# Patient Record
Sex: Female | Born: 1955 | Race: White | Hispanic: No | Marital: Married | Smoking: Never smoker
Health system: Southern US, Community
[De-identification: ages and names within clinical notes are randomized; demographics above are authoritative.]

## PROBLEM LIST (undated history)

## (undated) DIAGNOSIS — K760 Fatty (change of) liver, not elsewhere classified: Secondary | ICD-10-CM

## (undated) DIAGNOSIS — K449 Diaphragmatic hernia without obstruction or gangrene: Secondary | ICD-10-CM

## (undated) DIAGNOSIS — M797 Fibromyalgia: Secondary | ICD-10-CM

## (undated) DIAGNOSIS — D649 Anemia, unspecified: Secondary | ICD-10-CM

## (undated) DIAGNOSIS — F32A Depression, unspecified: Secondary | ICD-10-CM

## (undated) DIAGNOSIS — R011 Cardiac murmur, unspecified: Secondary | ICD-10-CM

## (undated) DIAGNOSIS — M549 Dorsalgia, unspecified: Secondary | ICD-10-CM

## (undated) DIAGNOSIS — M5136 Other intervertebral disc degeneration, lumbar region: Secondary | ICD-10-CM

## (undated) DIAGNOSIS — J189 Pneumonia, unspecified organism: Secondary | ICD-10-CM

## (undated) DIAGNOSIS — H04123 Dry eye syndrome of bilateral lacrimal glands: Secondary | ICD-10-CM

## (undated) DIAGNOSIS — M2550 Pain in unspecified joint: Secondary | ICD-10-CM

## (undated) DIAGNOSIS — R0602 Shortness of breath: Secondary | ICD-10-CM

## (undated) DIAGNOSIS — K219 Gastro-esophageal reflux disease without esophagitis: Secondary | ICD-10-CM

## (undated) DIAGNOSIS — I341 Nonrheumatic mitral (valve) prolapse: Secondary | ICD-10-CM

## (undated) DIAGNOSIS — F909 Attention-deficit hyperactivity disorder, unspecified type: Secondary | ICD-10-CM

## (undated) DIAGNOSIS — I10 Essential (primary) hypertension: Secondary | ICD-10-CM

## (undated) DIAGNOSIS — M1990 Unspecified osteoarthritis, unspecified site: Secondary | ICD-10-CM

## (undated) DIAGNOSIS — D175 Benign lipomatous neoplasm of intra-abdominal organs: Secondary | ICD-10-CM

## (undated) DIAGNOSIS — M542 Cervicalgia: Secondary | ICD-10-CM

## (undated) DIAGNOSIS — F419 Anxiety disorder, unspecified: Secondary | ICD-10-CM

## (undated) DIAGNOSIS — G43909 Migraine, unspecified, not intractable, without status migrainosus: Secondary | ICD-10-CM

## (undated) DIAGNOSIS — E739 Lactose intolerance, unspecified: Secondary | ICD-10-CM

## (undated) DIAGNOSIS — K5900 Constipation, unspecified: Secondary | ICD-10-CM

## (undated) DIAGNOSIS — K8020 Calculus of gallbladder without cholecystitis without obstruction: Secondary | ICD-10-CM

## (undated) DIAGNOSIS — R079 Chest pain, unspecified: Secondary | ICD-10-CM

## (undated) DIAGNOSIS — E063 Autoimmune thyroiditis: Secondary | ICD-10-CM

## (undated) DIAGNOSIS — K5790 Diverticulosis of intestine, part unspecified, without perforation or abscess without bleeding: Secondary | ICD-10-CM

## (undated) DIAGNOSIS — K648 Other hemorrhoids: Secondary | ICD-10-CM

## (undated) DIAGNOSIS — J45909 Unspecified asthma, uncomplicated: Secondary | ICD-10-CM

## (undated) DIAGNOSIS — M5126 Other intervertebral disc displacement, lumbar region: Secondary | ICD-10-CM

## (undated) DIAGNOSIS — E876 Hypokalemia: Secondary | ICD-10-CM

## (undated) DIAGNOSIS — M51369 Other intervertebral disc degeneration, lumbar region without mention of lumbar back pain or lower extremity pain: Secondary | ICD-10-CM

## (undated) DIAGNOSIS — D126 Benign neoplasm of colon, unspecified: Secondary | ICD-10-CM

## (undated) HISTORY — DX: Chest pain, unspecified: R07.9

## (undated) HISTORY — DX: Lactose intolerance, unspecified: E73.9

## (undated) HISTORY — DX: Constipation, unspecified: K59.00

## (undated) HISTORY — DX: Other intervertebral disc degeneration, lumbar region: M51.36

## (undated) HISTORY — DX: Shortness of breath: R06.02

## (undated) HISTORY — DX: Diverticulosis of intestine, part unspecified, without perforation or abscess without bleeding: K57.90

## (undated) HISTORY — DX: Anemia, unspecified: D64.9

## (undated) HISTORY — DX: Gastro-esophageal reflux disease without esophagitis: K21.9

## (undated) HISTORY — DX: Unspecified asthma, uncomplicated: J45.909

## (undated) HISTORY — PX: COLONOSCOPY: SHX174

## (undated) HISTORY — DX: Migraine, unspecified, not intractable, without status migrainosus: G43.909

## (undated) HISTORY — DX: Fatty (change of) liver, not elsewhere classified: K76.0

## (undated) HISTORY — DX: Benign neoplasm of colon, unspecified: D12.6

## (undated) HISTORY — DX: Other intervertebral disc degeneration, lumbar region without mention of lumbar back pain or lower extremity pain: M51.369

## (undated) HISTORY — DX: Hypokalemia: E87.6

## (undated) HISTORY — DX: Essential (primary) hypertension: I10

## (undated) HISTORY — DX: Nonrheumatic mitral (valve) prolapse: I34.1

## (undated) HISTORY — DX: Anxiety disorder, unspecified: F41.9

## (undated) HISTORY — DX: Cervicalgia: M54.2

## (undated) HISTORY — DX: Depression, unspecified: F32.A

## (undated) HISTORY — DX: Unspecified osteoarthritis, unspecified site: M19.90

## (undated) HISTORY — DX: Diaphragmatic hernia without obstruction or gangrene: K44.9

## (undated) HISTORY — DX: Pain in unspecified joint: M25.50

## (undated) HISTORY — DX: Cardiac murmur, unspecified: R01.1

## (undated) HISTORY — DX: Dorsalgia, unspecified: M54.9

## (undated) HISTORY — DX: Benign lipomatous neoplasm of intra-abdominal organs: D17.5

## (undated) HISTORY — DX: Calculus of gallbladder without cholecystitis without obstruction: K80.20

## (undated) HISTORY — DX: Other intervertebral disc displacement, lumbar region: M51.26

## (undated) HISTORY — DX: Other hemorrhoids: K64.8

## (undated) HISTORY — DX: Fibromyalgia: M79.7

## (undated) HISTORY — DX: Dry eye syndrome of bilateral lacrimal glands: H04.123

## (undated) HISTORY — PX: HERNIA REPAIR: SHX51

---

## 1988-08-02 HISTORY — PX: TOE SURGERY: SHX1073

## 1996-08-02 HISTORY — PX: ADENOIDECTOMY: SUR15

## 1999-11-27 ENCOUNTER — Ambulatory Visit (HOSPITAL_COMMUNITY): Admission: RE | Admit: 1999-11-27 | Discharge: 1999-11-27 | Payer: Self-pay | Admitting: Gastroenterology

## 1999-11-27 ENCOUNTER — Encounter: Payer: Self-pay | Admitting: Gastroenterology

## 2001-04-25 ENCOUNTER — Encounter: Admission: RE | Admit: 2001-04-25 | Discharge: 2001-04-25 | Payer: Self-pay | Admitting: General Surgery

## 2001-04-25 ENCOUNTER — Encounter: Payer: Self-pay | Admitting: General Surgery

## 2001-07-02 HISTORY — PX: HERNIA REPAIR: SHX51

## 2001-07-07 ENCOUNTER — Encounter: Payer: Self-pay | Admitting: General Surgery

## 2001-07-11 ENCOUNTER — Observation Stay (HOSPITAL_COMMUNITY): Admission: RE | Admit: 2001-07-11 | Discharge: 2001-07-12 | Payer: Self-pay | Admitting: General Surgery

## 2003-08-03 HISTORY — PX: HYSTEROSCOPY W/ ENDOMETRIAL ABLATION: SUR665

## 2003-11-08 ENCOUNTER — Ambulatory Visit (HOSPITAL_COMMUNITY): Admission: RE | Admit: 2003-11-08 | Discharge: 2003-11-08 | Payer: Self-pay | Admitting: Obstetrics & Gynecology

## 2003-11-08 IMAGING — CT CT PELVIS W/ CM
1 of 3 series · 14 of 32 positions shown, 19 images · IV contrast (CONTRAST)
Comparison: none

CLINICAL DATA: Abdominal bloating; history of   multiple prior abdominal surgeries.  Nausea and decreased appetite.
TECHNIQUE: Contiguous axial CT images were obtained from the lung bases through the iliac crests following oral contrast and intravenous administration of 100 cc Omnipaque 300 IV contrast.
 CT ABDOMEN WITH CONTRAST
 Comparing to the report from prior CT scan from [REDACTED] dated [DATE].  
 There are numerous hypodensities in the liver, most of which appear to represent cysts.  Some of these are too small to characterize.  These are mentioned in passing on the prior report but I do not have the prior exam available for direct comparison and thus, I cannot compare these on an individual basis.  
 The spleen and adrenal glands appear normal.  The pancreas appears unremarkable.  There are clips near the gastroesophageal junction and also an anterior hernia mesh.  
 One of the lesions in the liver, on image #24 of series 134, is particularly indistinct and is not visible on the delayed images.  This could represent a hemangioma but is not well defined like the other lesions in the liver.  
 No retroperitoneal adenopathy.  
 IMPRESSION
 Numerous liver lesions, most of which are cysts, but some of which are non specific.  One of these, on image #24, is very indistinct.   While statistically this is likely to be a hemangioma or other benign lesion, I cannot exclude a metastatic lesion.  This may warrant further evaluation, such as with dynamic MR or ultrasound.  
 CT PELVIS WITH CONTRAST
 Contiguous axial CT images were obtained from the iliac crests through the proximal femurs.
 Urinary bladder appears unremarkable.  Endometrium is well seen as are the ovaries.  No free pelvic fluid.  Visualized bowel appears unremarkable.  
 No significant abnormal pelvic findings.

[Series 134: — · axial · 0.73mm/px · z∈[+1214,+1614]mm · 14 of 92 slices shown, 19 images]
[im 6/92  soft-tissue]
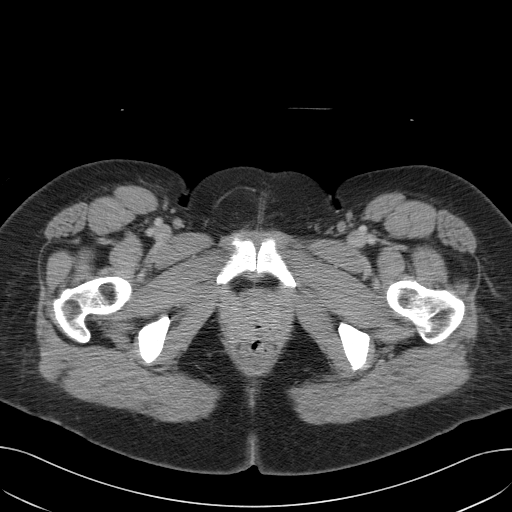
[im 6/92  bone]
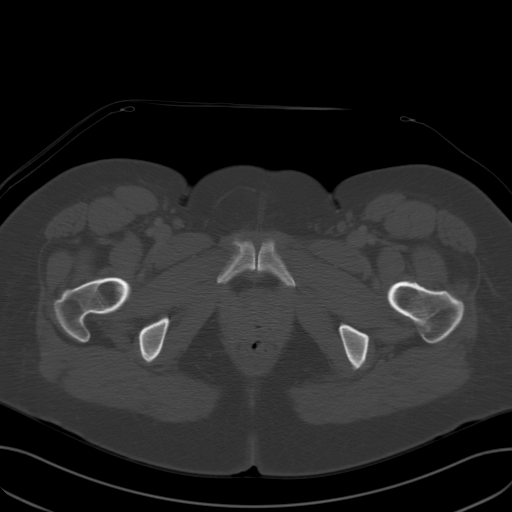
[im 11/92  soft-tissue]
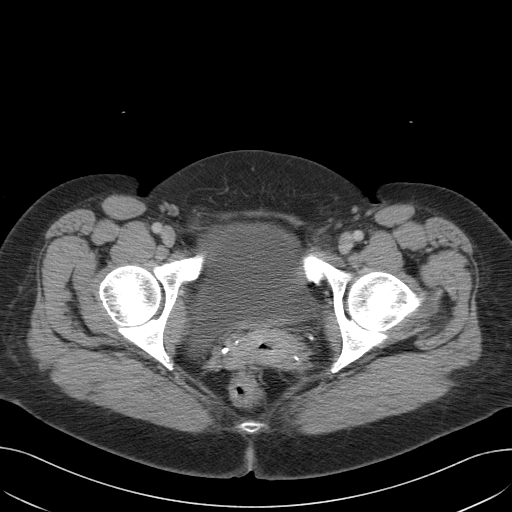
[im 22/92  soft-tissue]
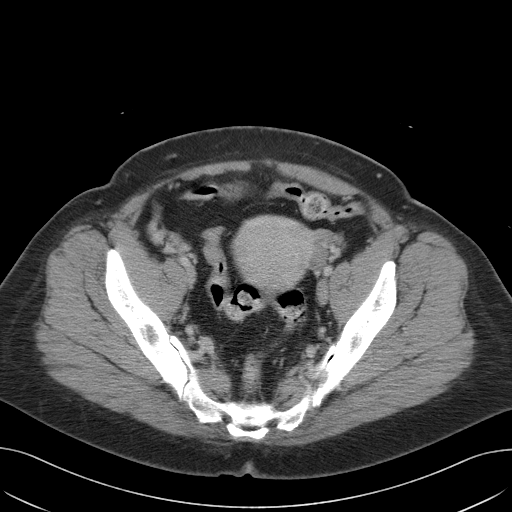
[im 27/92  soft-tissue]
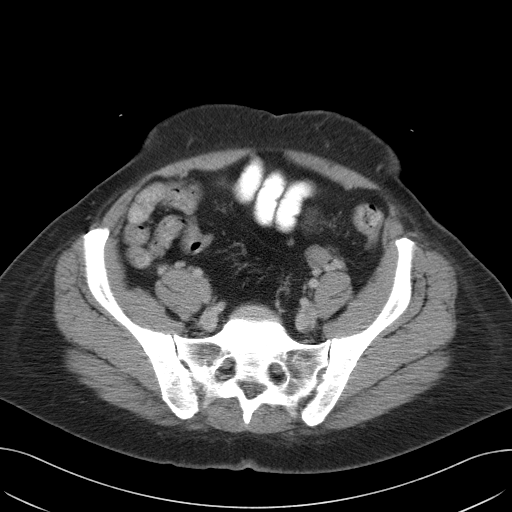
[im 33/92  soft-tissue]
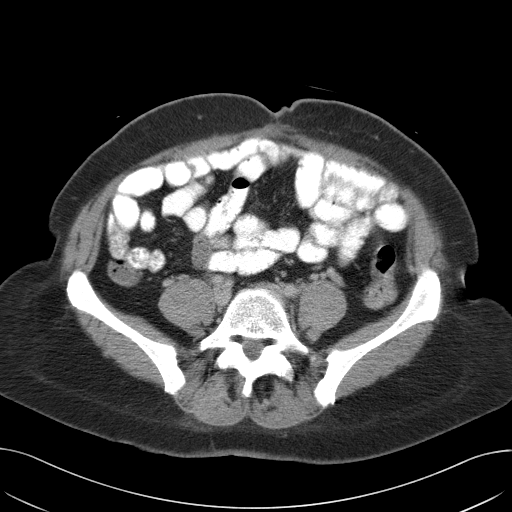
[im 38/92  soft-tissue]
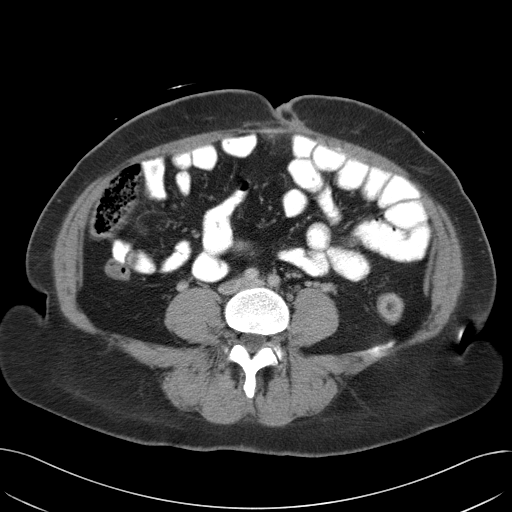
[im 49/92  soft-tissue]
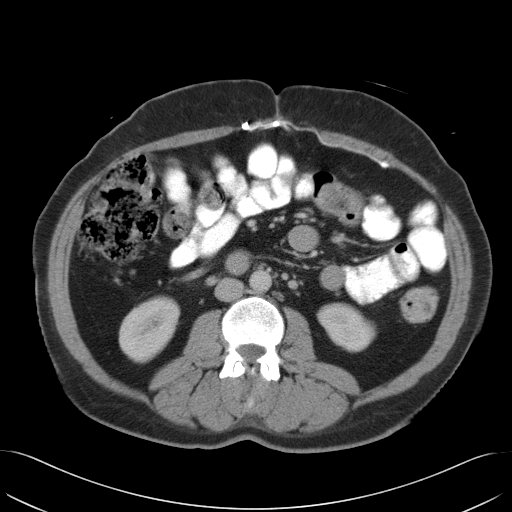
[im 54/92  soft-tissue]
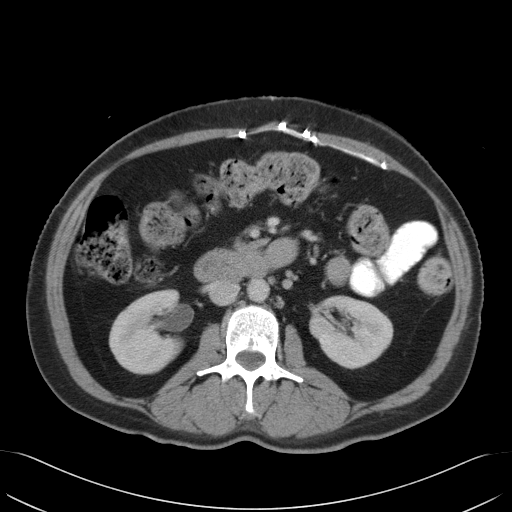
[im 59/92  soft-tissue]
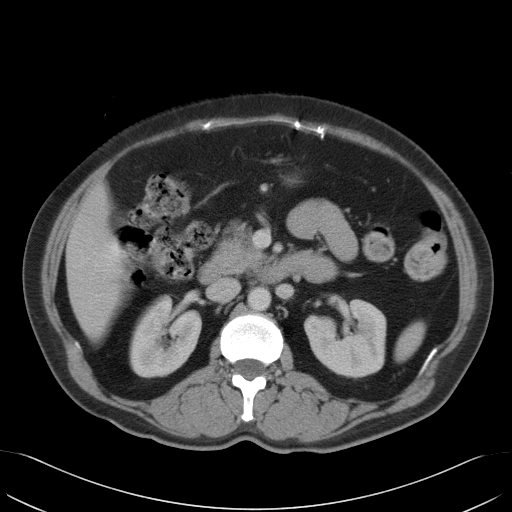
[im 59/92  bone]
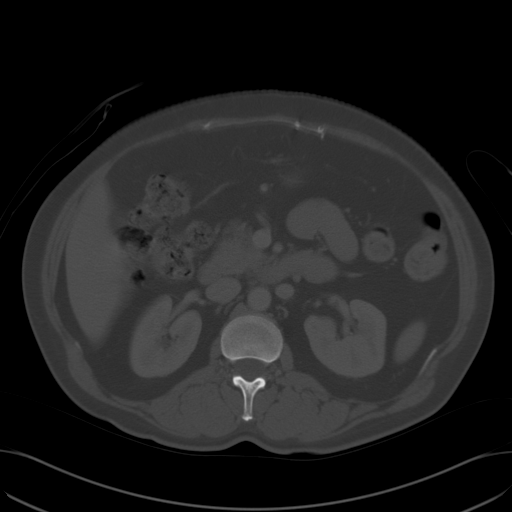
[im 65/92  soft-tissue]
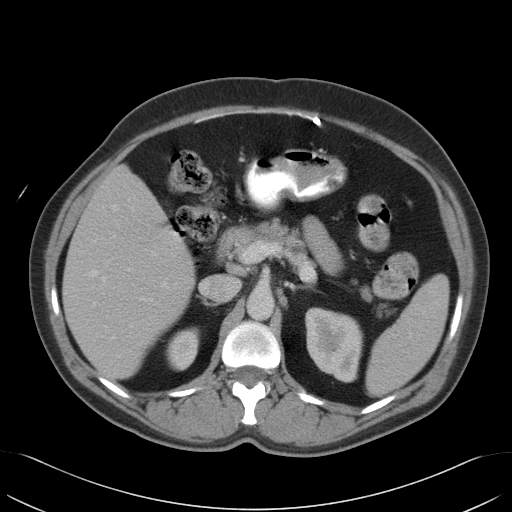
[im 70/92  soft-tissue]
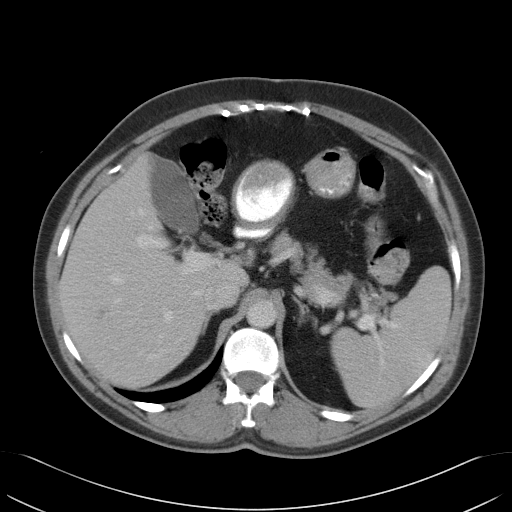
[im 70/92  lung]
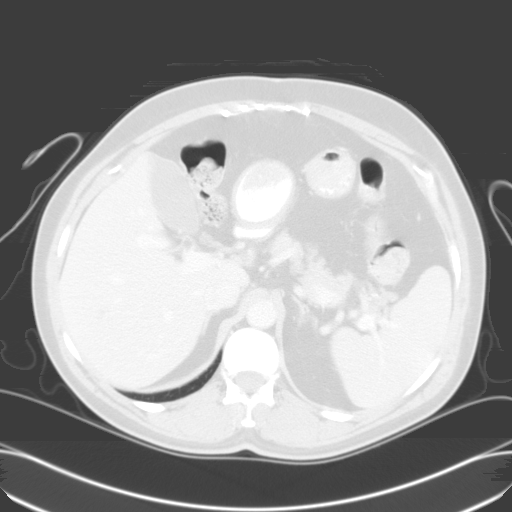
[im 75/92  lung]
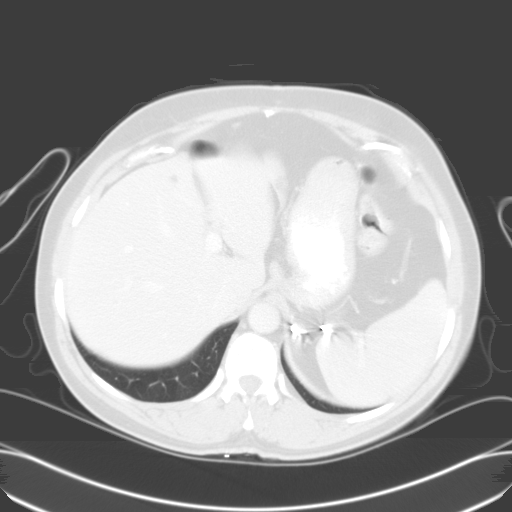
[im 81/92  soft-tissue]
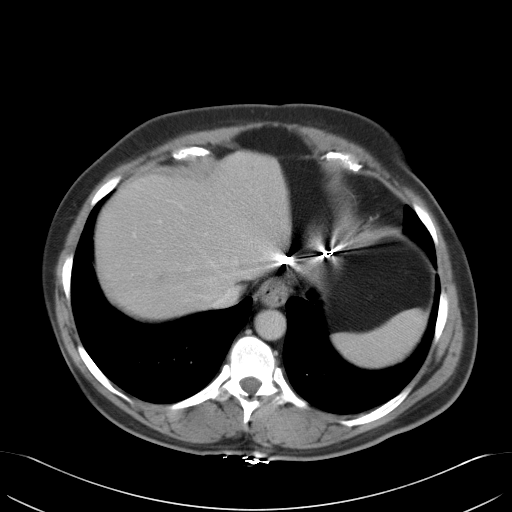
[im 81/92  lung]
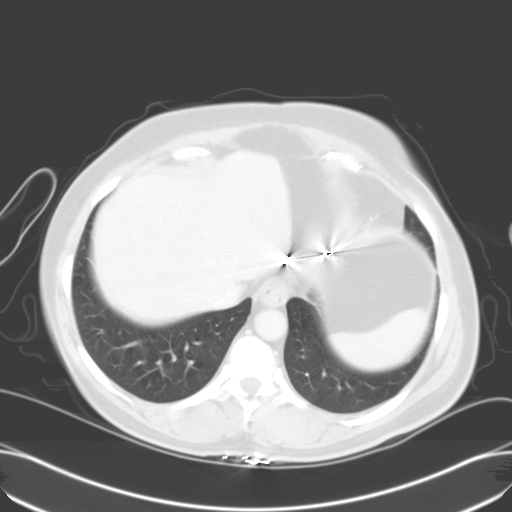
[im 86/92  soft-tissue]
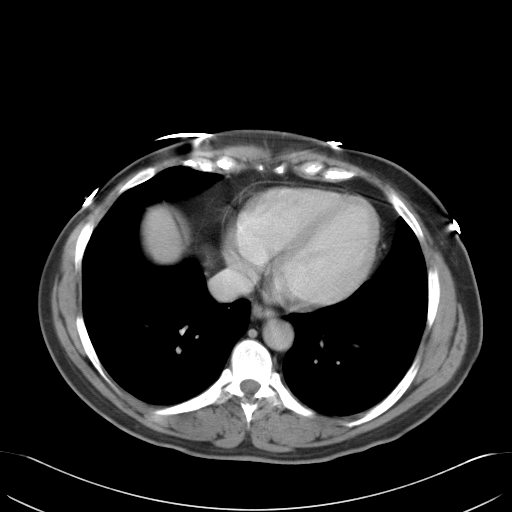
[im 86/92  lung]
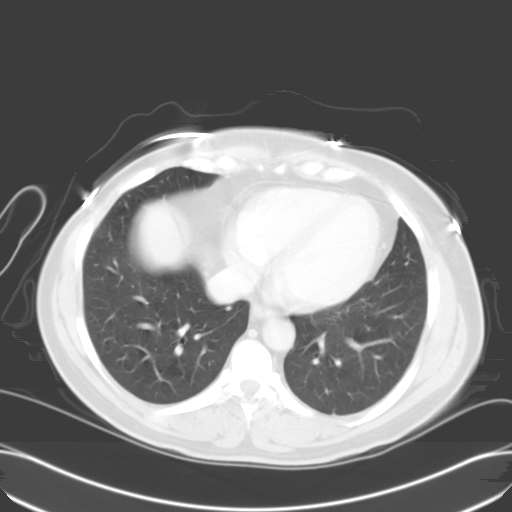

[14 of 32 positions shown; findings below may reference images not displayed]

## 2003-11-19 ENCOUNTER — Ambulatory Visit (HOSPITAL_COMMUNITY): Admission: RE | Admit: 2003-11-19 | Discharge: 2003-11-19 | Payer: Self-pay | Admitting: Obstetrics & Gynecology

## 2003-11-19 IMAGING — US US ABDOMEN LIMITED
1 series · 13 of 25 positions shown · non-contrast
Comparison: none

CLINICAL DATA: Follow-up liver cyst, also questionable lesion in the right lobe on recent CT.
 LIMITED ABDOMINAL ULTRASOUND
 Limited scans of the abdomen were performed.  This is correlated with the CT of the abdomen from [HOSPITAL] dated [DATE].  
 The liver lesions described on recent CT do appear to be primarily cystic although several are septated or have minimal debris within them.  One lesion in the lateral aspect of the right lobe is hyperechogenic and it was not well defined on CT, most consistent with hemangioma.  No other hepatic lesion is seen.  No ductal dilatation is noted.  There is a single gallbladder polyp present.  Also, it is noted that the gallbladder wall is slightly prominent measuring approximately 4 mm in thickness.  This could be due to some contraction, but edema cannot be excluded.  Clinical correlation and correlation with LFTs is recommended.  The common bile duct is normal in caliber. 
 IMPRESSION
 1.  The majority of the lesions noted on CT throughout the liver represent cysts, some of which are septated with some internal debris. 
 2.  Poorly defined lesion in the right lobe of liver by CT is compatible with hemangioma by ultrasound. 
 3.  Gallbladder polyp.  Somewhat thickened gallbladder wall.  Correlate clinically.

[Series 1: unknown · 0.33mm/px · 13 of 47 slices shown]
[im 1/47]
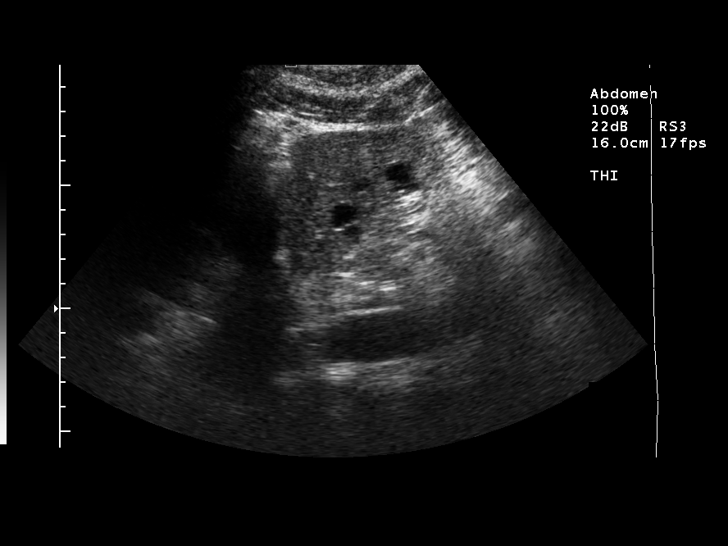
[im 4/47]
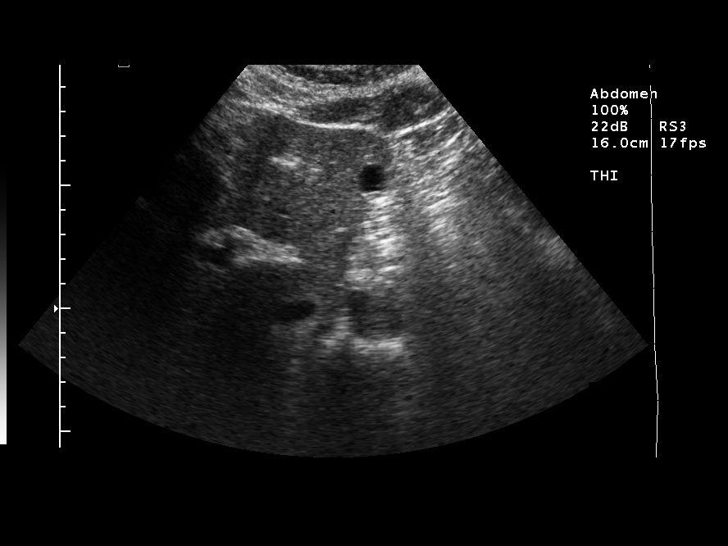
[im 8/47]
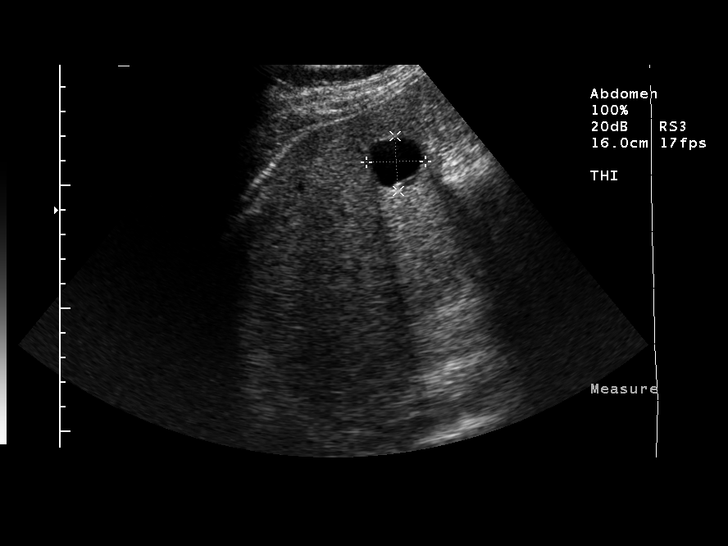
[im 12/47]
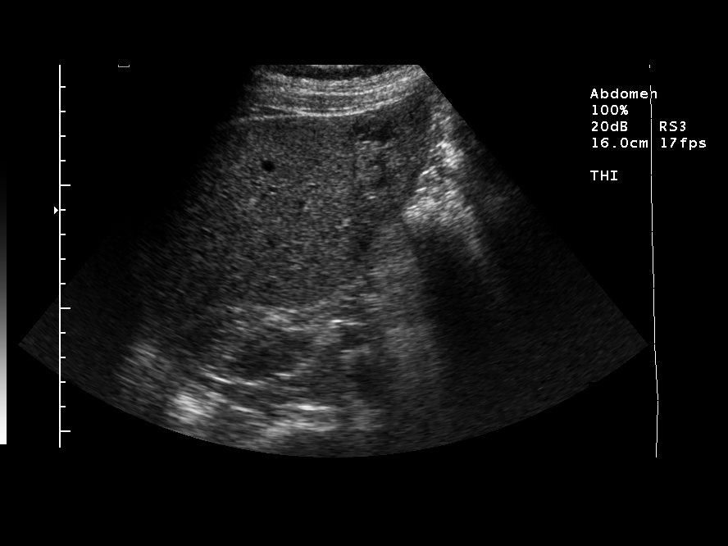
[im 16/47]
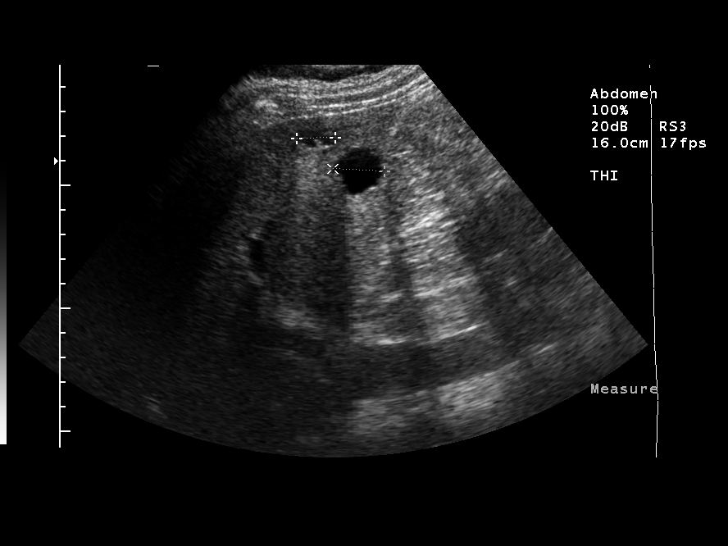
[im 20/47]
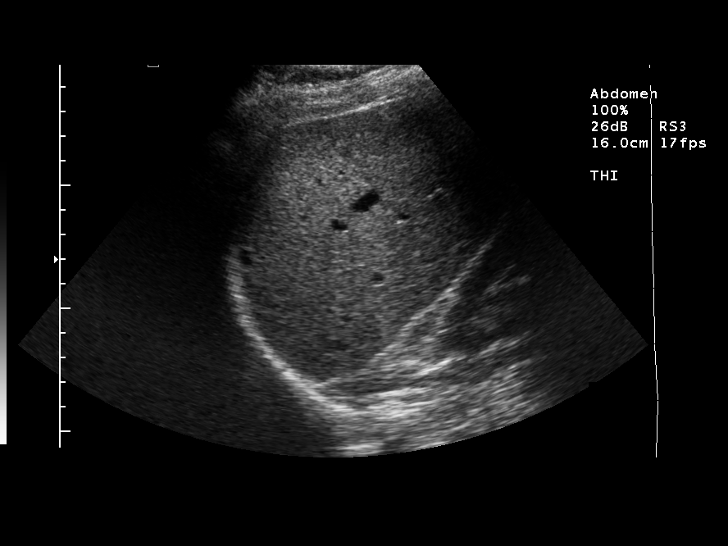
[im 24/47]
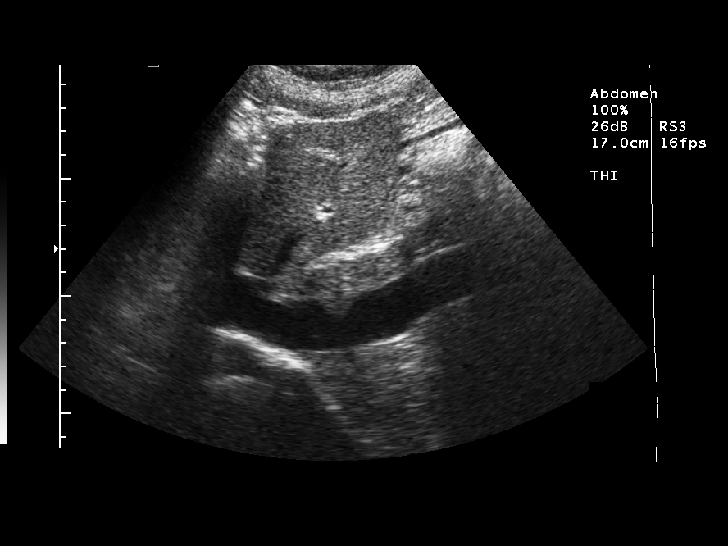
[im 27/47]
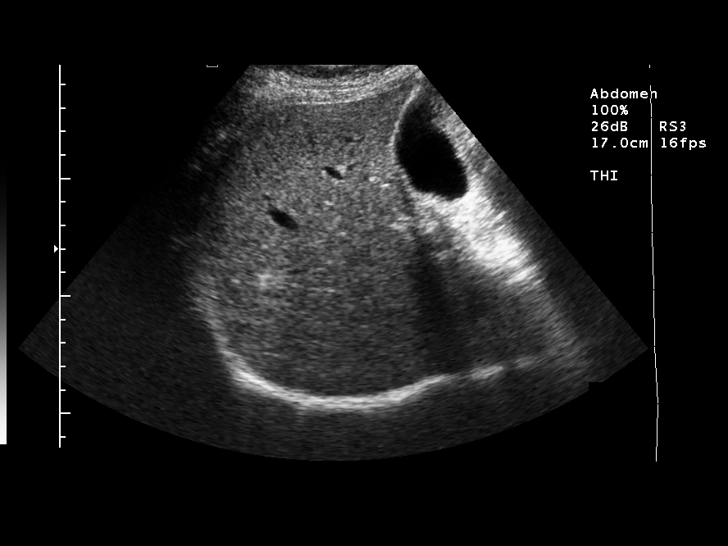
[im 31/47]
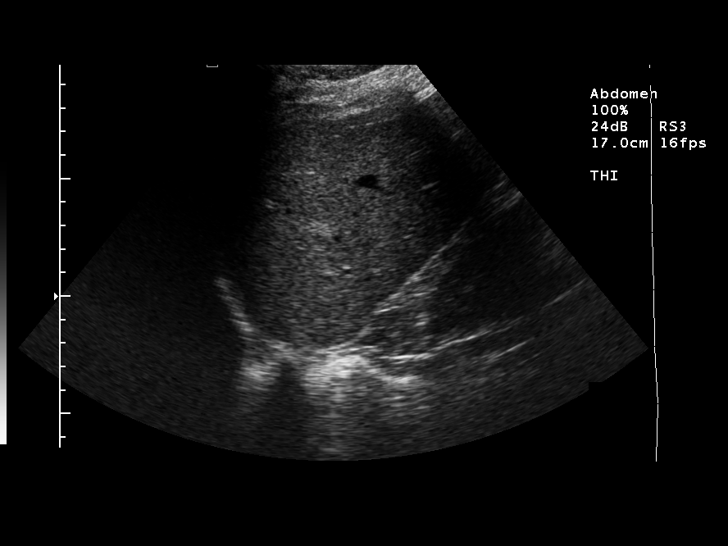
[im 35/47]
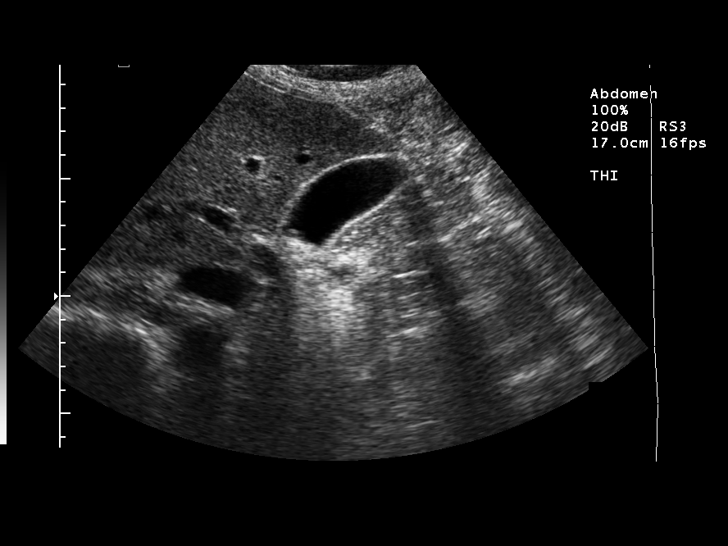
[im 39/47]
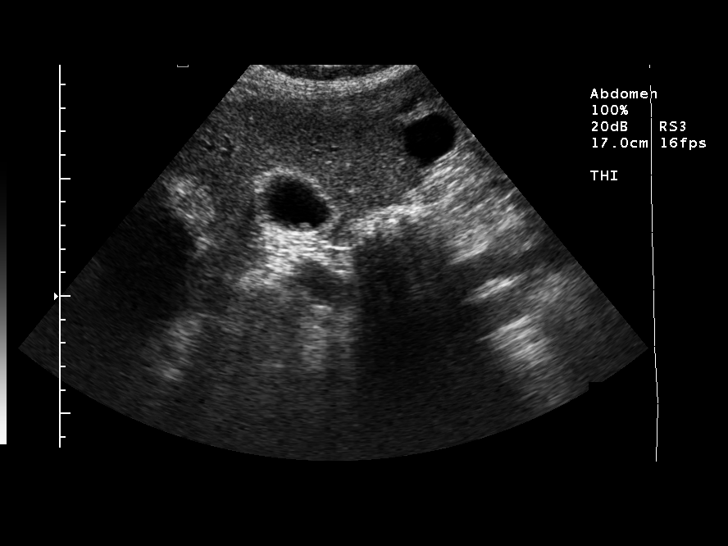
[im 43/47]
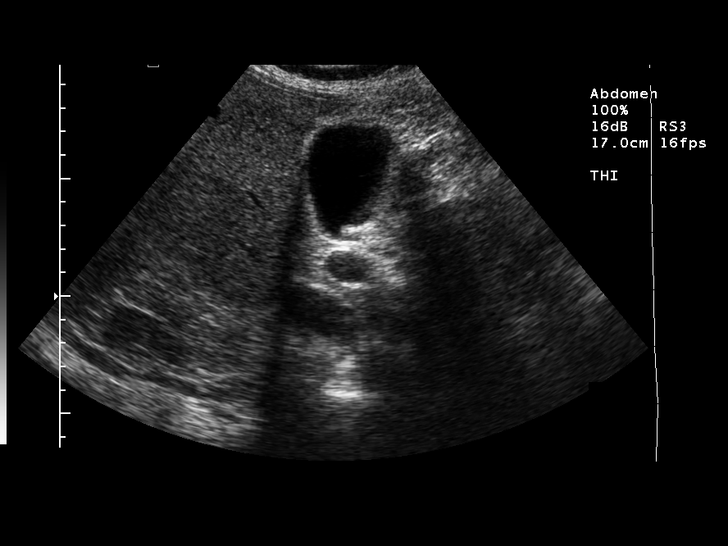
[im 47/47]
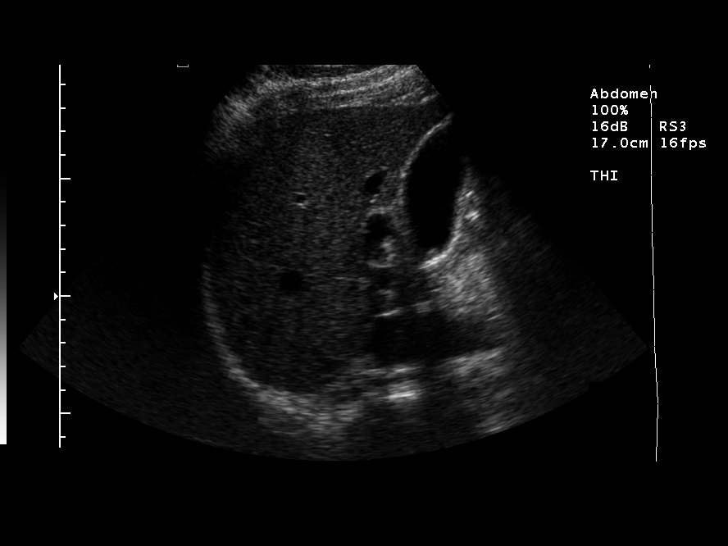

[13 of 25 positions shown; findings below may reference images not displayed]

## 2003-11-21 ENCOUNTER — Ambulatory Visit (HOSPITAL_COMMUNITY): Admission: RE | Admit: 2003-11-21 | Discharge: 2003-11-21 | Payer: Self-pay | Admitting: Obstetrics & Gynecology

## 2003-12-17 ENCOUNTER — Ambulatory Visit (HOSPITAL_COMMUNITY): Admission: RE | Admit: 2003-12-17 | Discharge: 2003-12-17 | Payer: Self-pay | Admitting: Gastroenterology

## 2003-12-17 IMAGING — NM NM HEPATO W/GB/PHARM/[PERSON_NAME]
2 series · 12 of 12 positions shown · non-contrast
Comparison: none

CLINICAL DATA: Abdominal pain.
 HEPATOBILIARY SCAN
 Hepatobiliary imaging performed using 5.0 mCi [RI] Mebrofenin.  Prompt tracer extraction from blood stream indicating normal hepatocellular function.  Gallbladder is visualized by 10 minutes.  Small bowel activity is seen by 57 minutes.  No hepatic retention of tracer.  
 Following ingestion of half and half, imaging was continued for 60 minutes.  Gallbladder demonstrates emptying of tracer following fatty meal stimulation.  Calculated ejection fraction is 68% which is normal.
 IMPRESSION
 Normal exam.

[Series 1: hepatobiliary · 3.20mm/px · 6 of 60 frames shown (1 of 2)]
[frame 6/60]
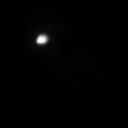
[frame 16/60]
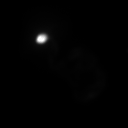
[frame 26/60]
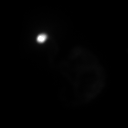
[frame 36/60]
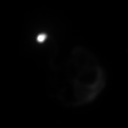
[frame 46/60]
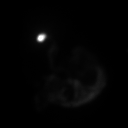
[frame 56/60]
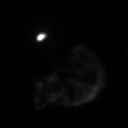

[Series 1: hepatobiliary · 3.20mm/px · 6 of 60 frames shown (2 of 2)]
[frame 6/60]
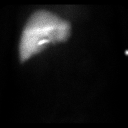
[frame 16/60]
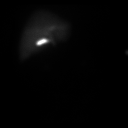
[frame 26/60]
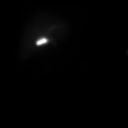
[frame 36/60]
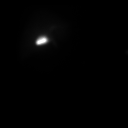
[frame 46/60]
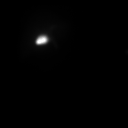
[frame 56/60]
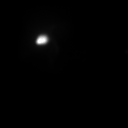

[12 of 12 positions shown; findings below may reference images not displayed]

## 2006-01-06 ENCOUNTER — Encounter: Admission: RE | Admit: 2006-01-06 | Discharge: 2006-01-06 | Payer: Self-pay | Admitting: Internal Medicine

## 2006-11-03 ENCOUNTER — Ambulatory Visit: Payer: Self-pay | Admitting: Gastroenterology

## 2007-06-16 ENCOUNTER — Ambulatory Visit (HOSPITAL_COMMUNITY): Admission: RE | Admit: 2007-06-16 | Discharge: 2007-06-16 | Payer: Self-pay | Admitting: Internal Medicine

## 2007-06-16 IMAGING — CR DG SHOULDER 2+V*R*
3 series · 3 of 3 positions shown · non-contrast
Comparison: none

HISTORY: Pain right shoulder, left knee, left heel, history fibromyalgia

RIGHT SHOULDER 3 VIEWS:
Questionable mild decrease in bone mineralization.
AC joint alignment normal.
No fracture, dislocation, or bone destruction.
Visualized right ribs intact.

[view not recorded (1 of 3)]
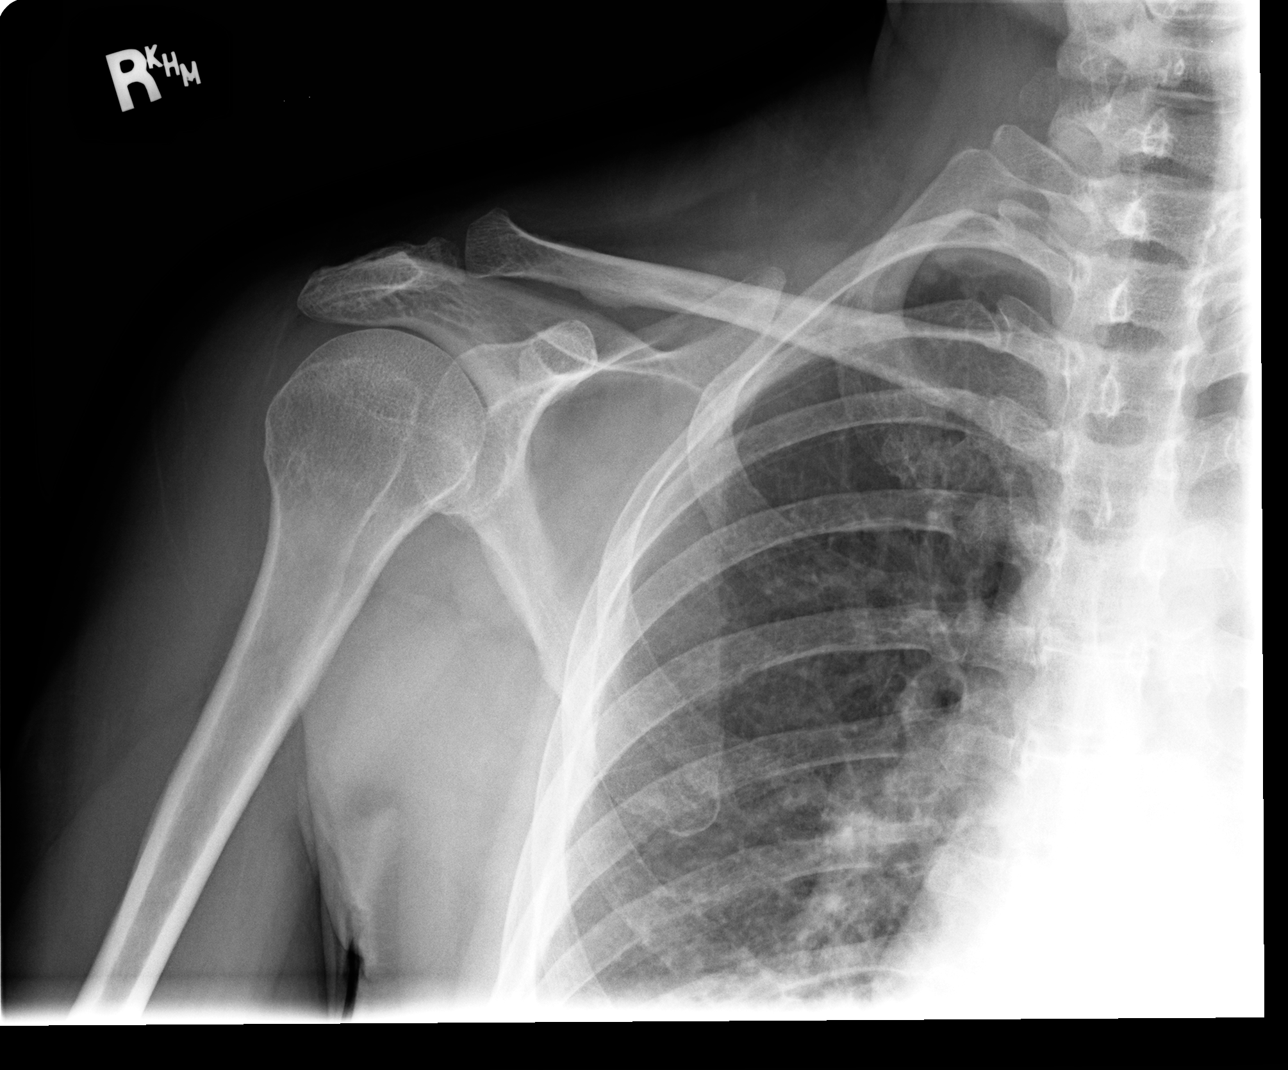

[view not recorded (2 of 3)]
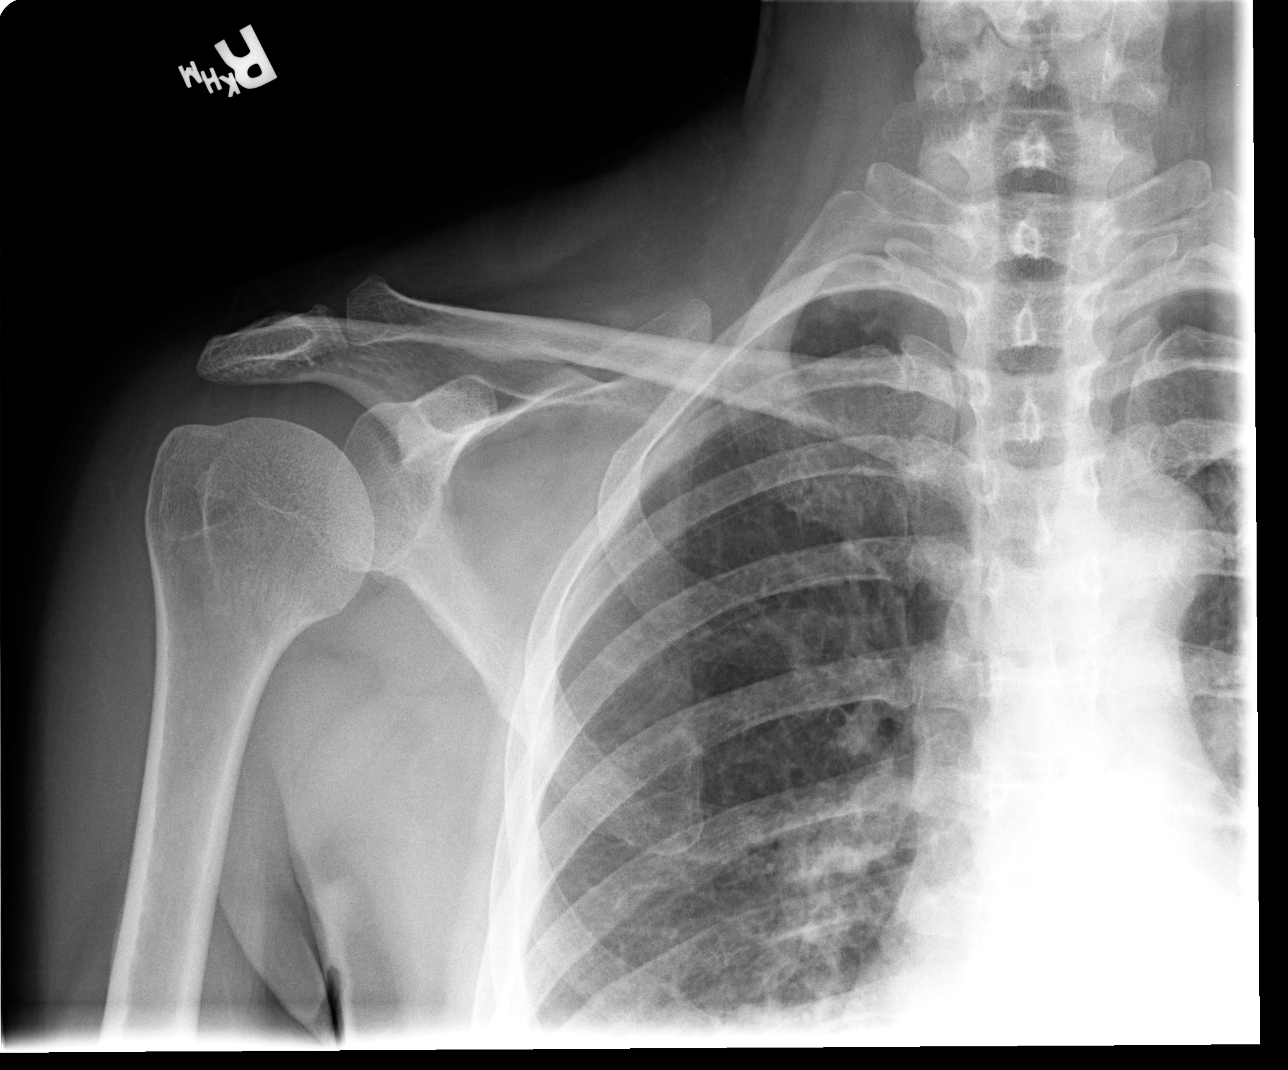

[view not recorded (3 of 3)]
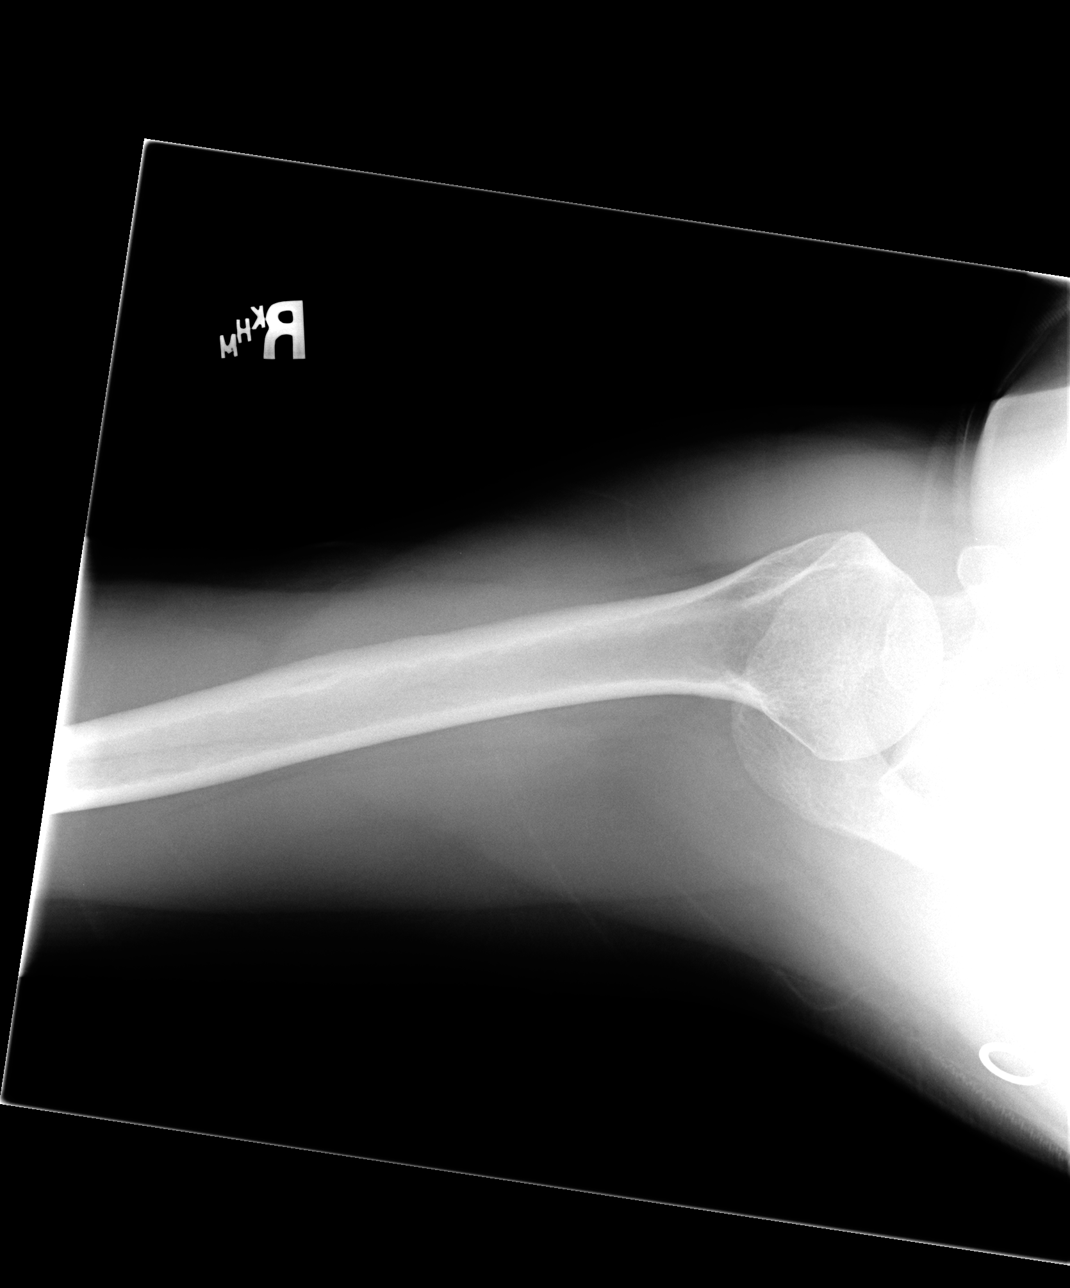

[3 of 3 positions shown; findings below may reference images not displayed]

IMPRESSION: No definite acute abnormalities.

LEFT KNEE 4 VIEWS:

Minimal medial compartment joint space narrowing.
Bone mineralization appears normal.
No fracture, dislocation, or bone destruction.
No knee joint effusion.
IMPRESSION: No acute abnormalities.

LEFT CALCANEUS 2 VIEWS:

Joint spaces preserved.
Bone mineralization normal.
No fracture, dislocation, or bone destruction.
No definite soft tissue abnormalities.
IMPRESSION: No acute abnormalities.

## 2007-06-16 IMAGING — CR DG KNEE COMPLETE 4+V*L*
4 series · 4 of 4 positions shown · non-contrast
Comparison: none

HISTORY: Pain right shoulder, left knee, left heel, history fibromyalgia

RIGHT SHOULDER 3 VIEWS:
Questionable mild decrease in bone mineralization.
AC joint alignment normal.
No fracture, dislocation, or bone destruction.
Visualized right ribs intact.

[view not recorded (1 of 4)]
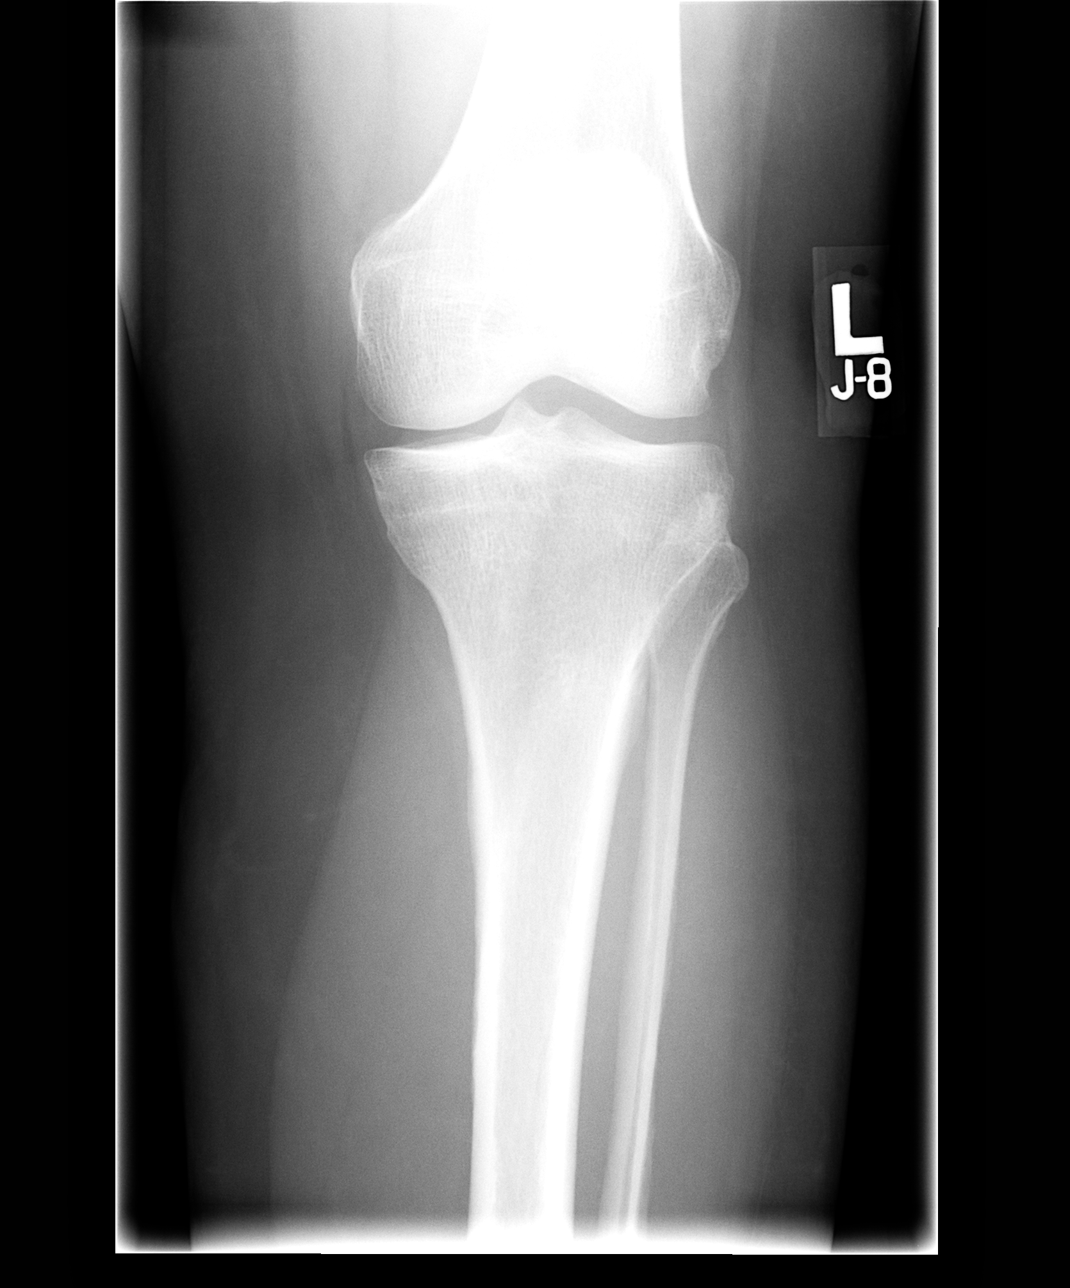

[view not recorded (2 of 4)]
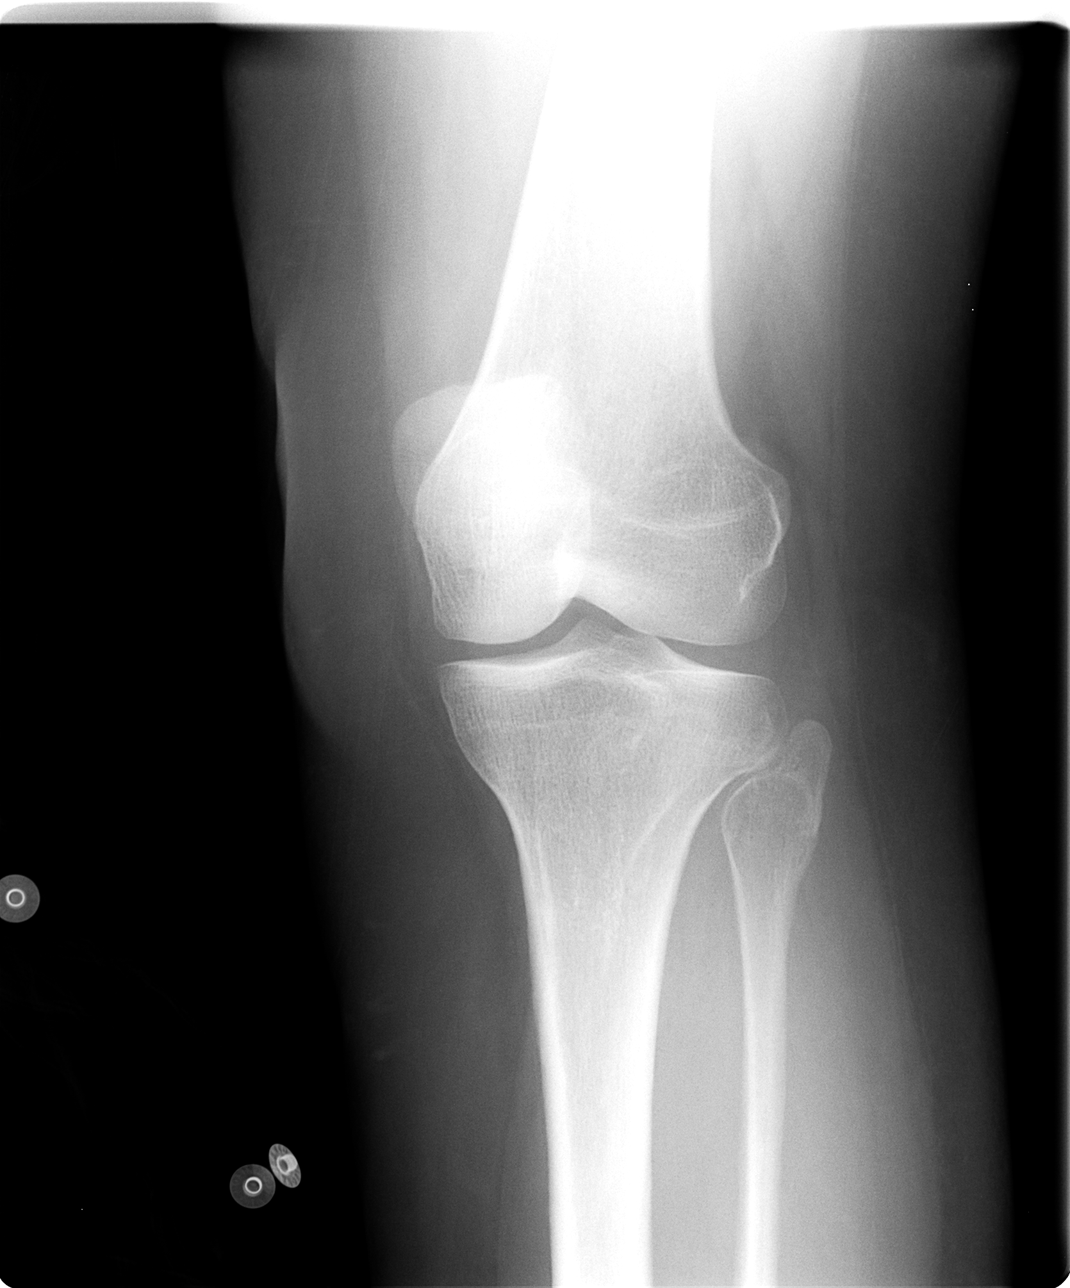

[view not recorded (3 of 4)]
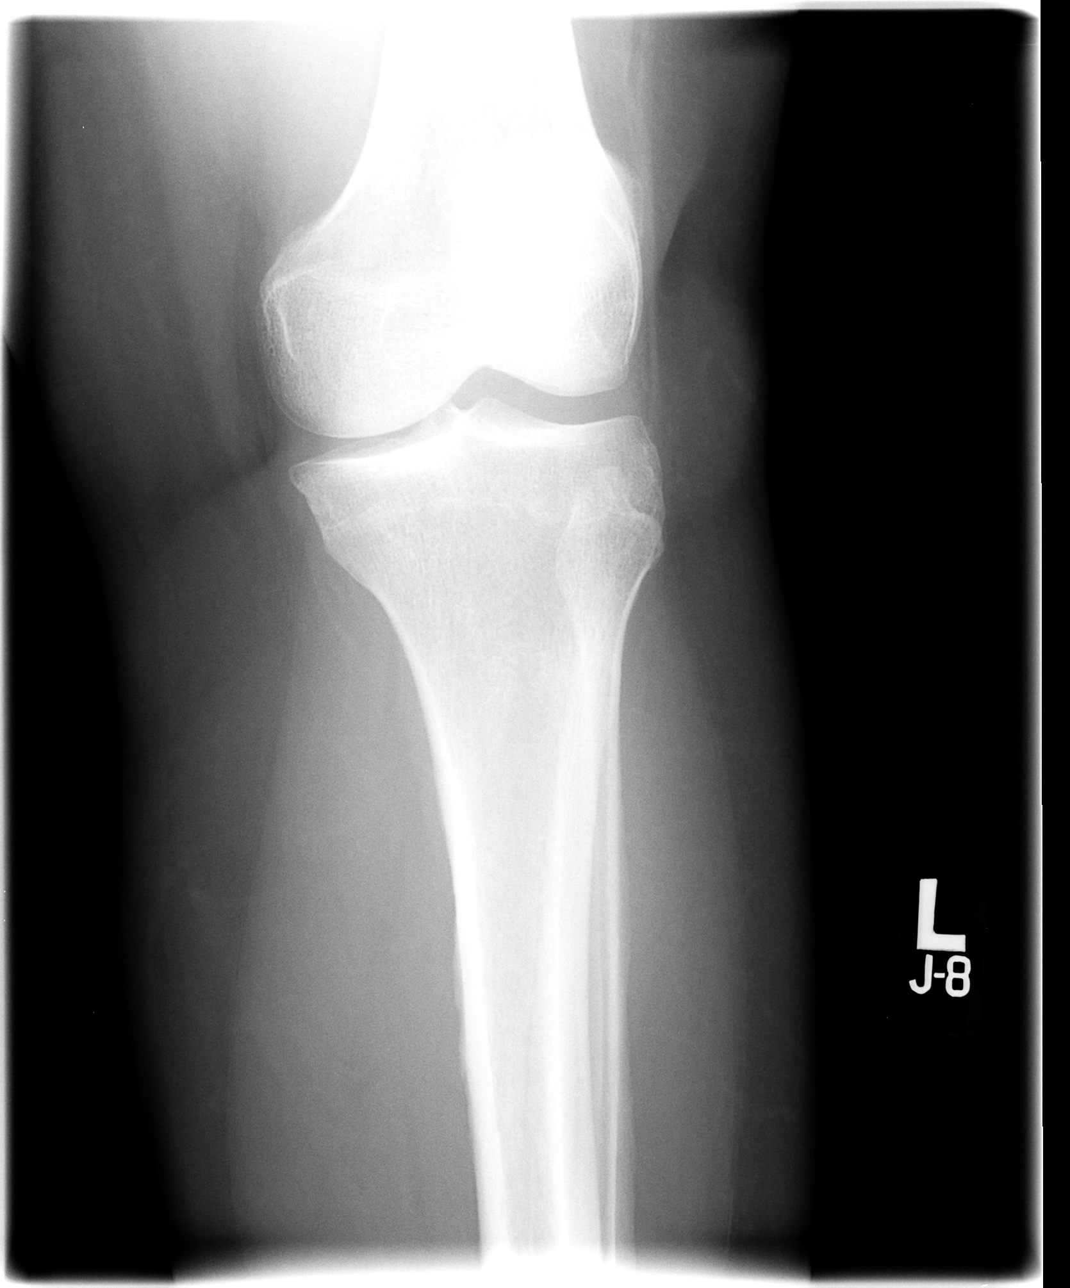

[view not recorded (4 of 4)]
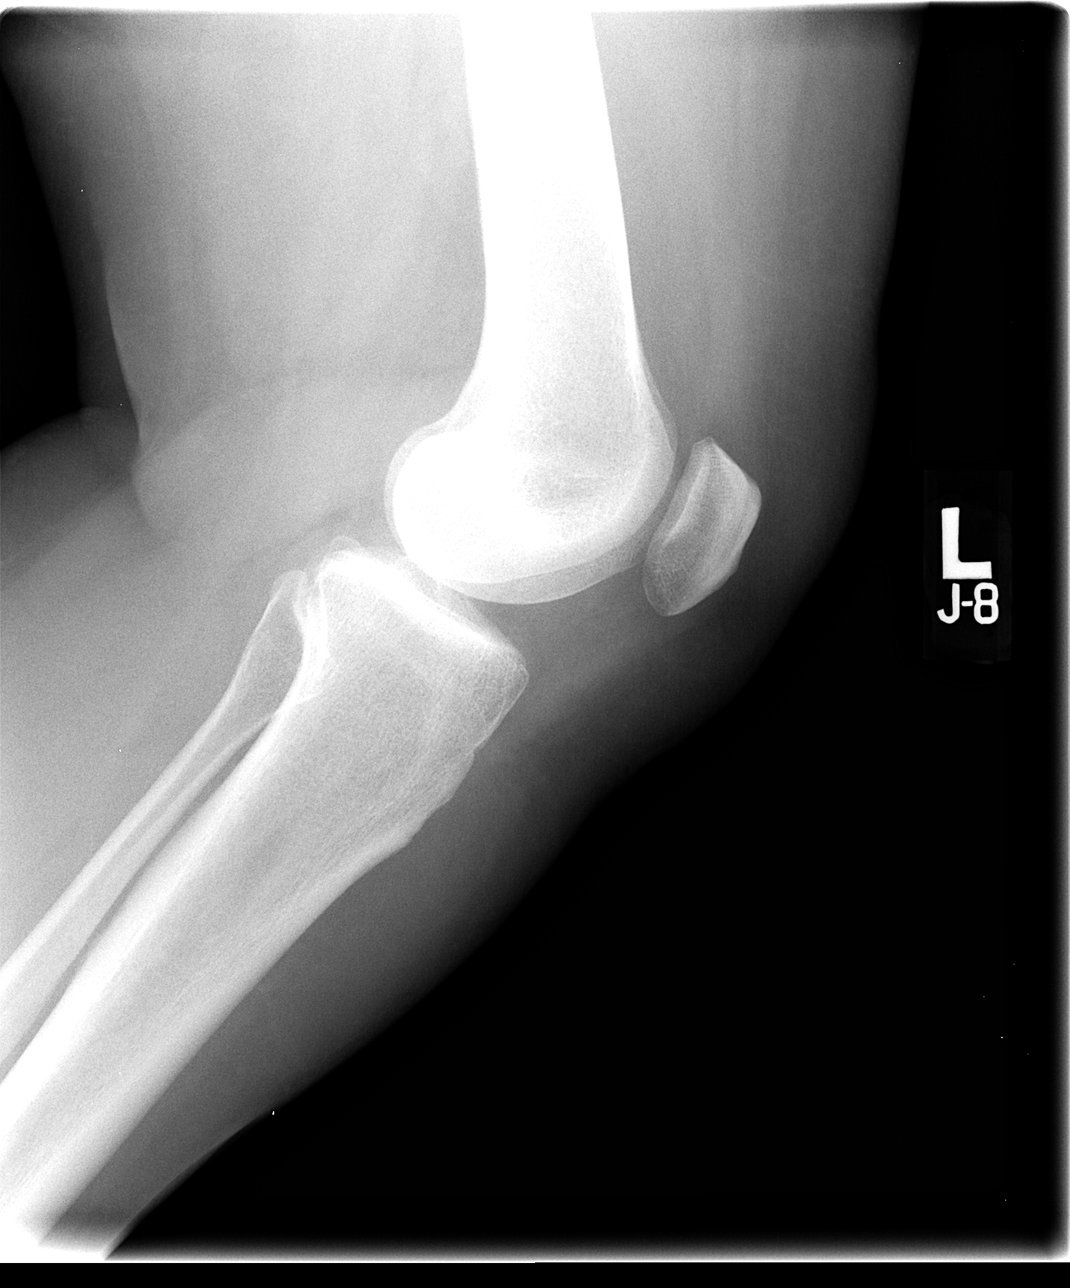

[4 of 4 positions shown; findings below may reference images not displayed]

IMPRESSION: No definite acute abnormalities.

LEFT KNEE 4 VIEWS:

Minimal medial compartment joint space narrowing.
Bone mineralization appears normal.
No fracture, dislocation, or bone destruction.
No knee joint effusion.
IMPRESSION: No acute abnormalities.

LEFT CALCANEUS 2 VIEWS:

Joint spaces preserved.
Bone mineralization normal.
No fracture, dislocation, or bone destruction.
No definite soft tissue abnormalities.
IMPRESSION: No acute abnormalities.

## 2007-06-16 IMAGING — CR DG OS CALCIS 2+V*L*
2 series · 2 of 2 positions shown · non-contrast
Comparison: none

HISTORY: Pain right shoulder, left knee, left heel, history fibromyalgia

RIGHT SHOULDER 3 VIEWS:
Questionable mild decrease in bone mineralization.
AC joint alignment normal.
No fracture, dislocation, or bone destruction.
Visualized right ribs intact.

[view not recorded (1 of 2)]
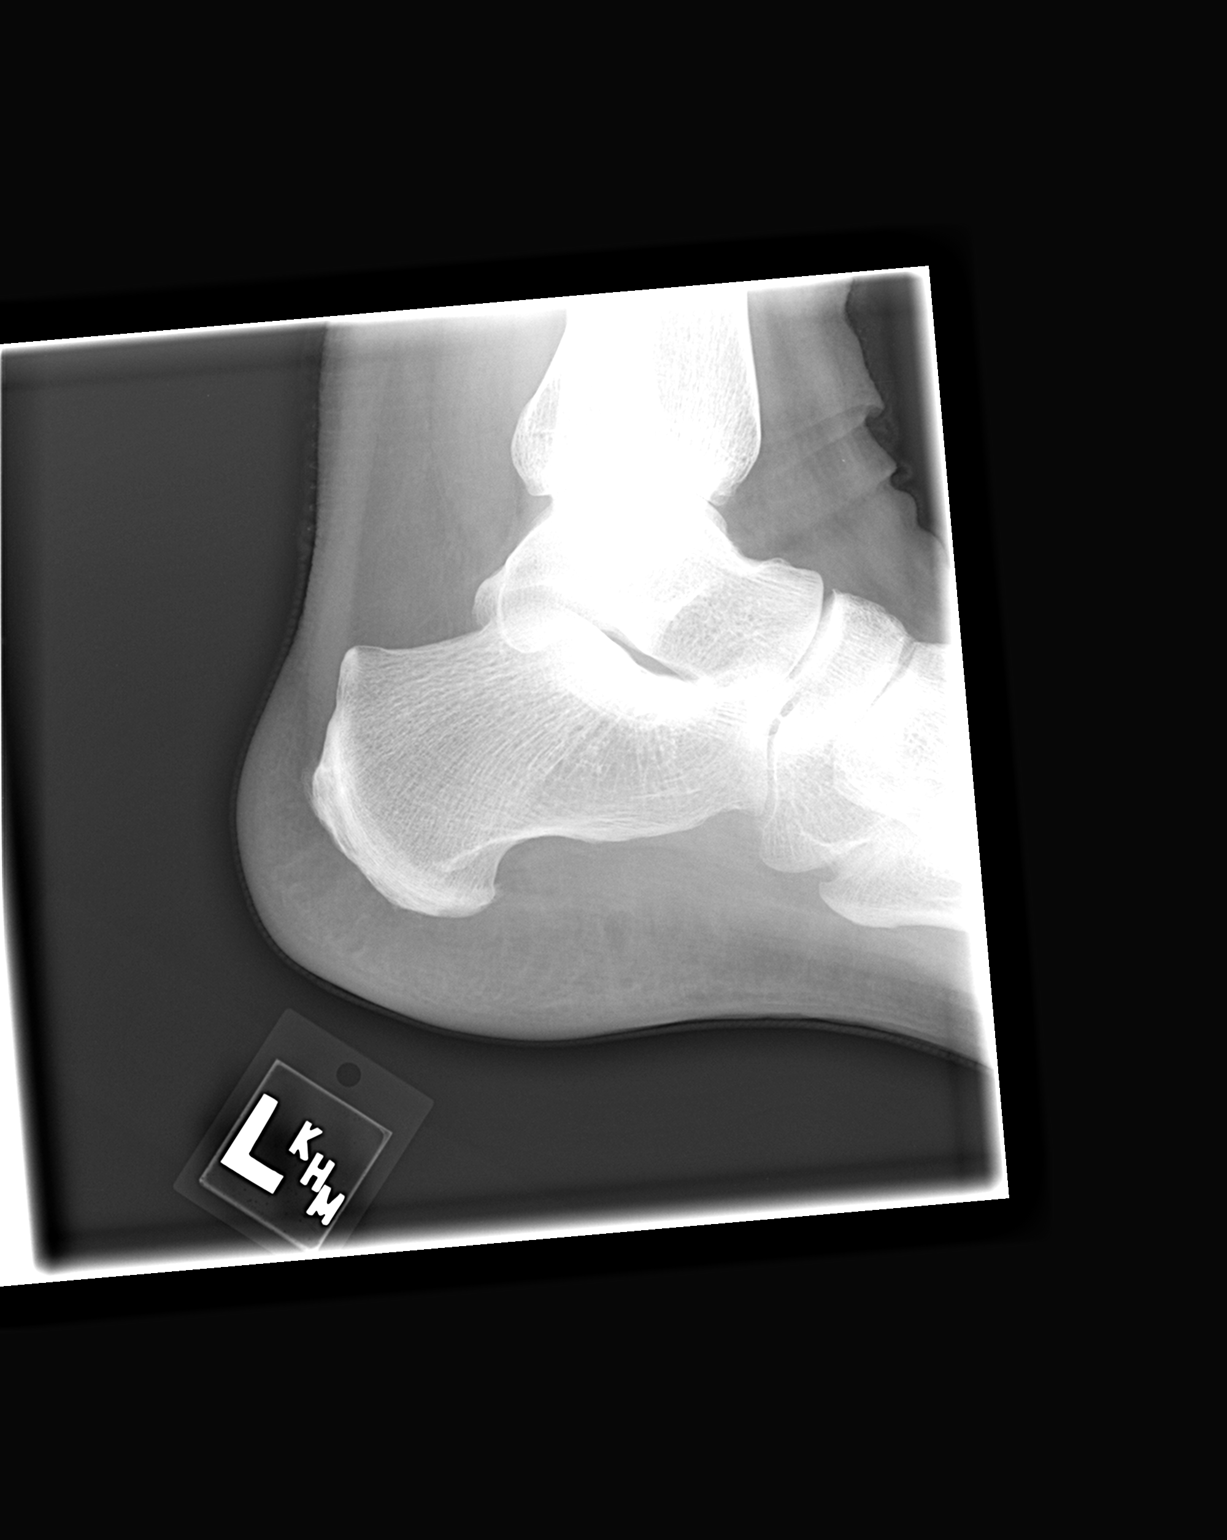

[view not recorded (2 of 2)]
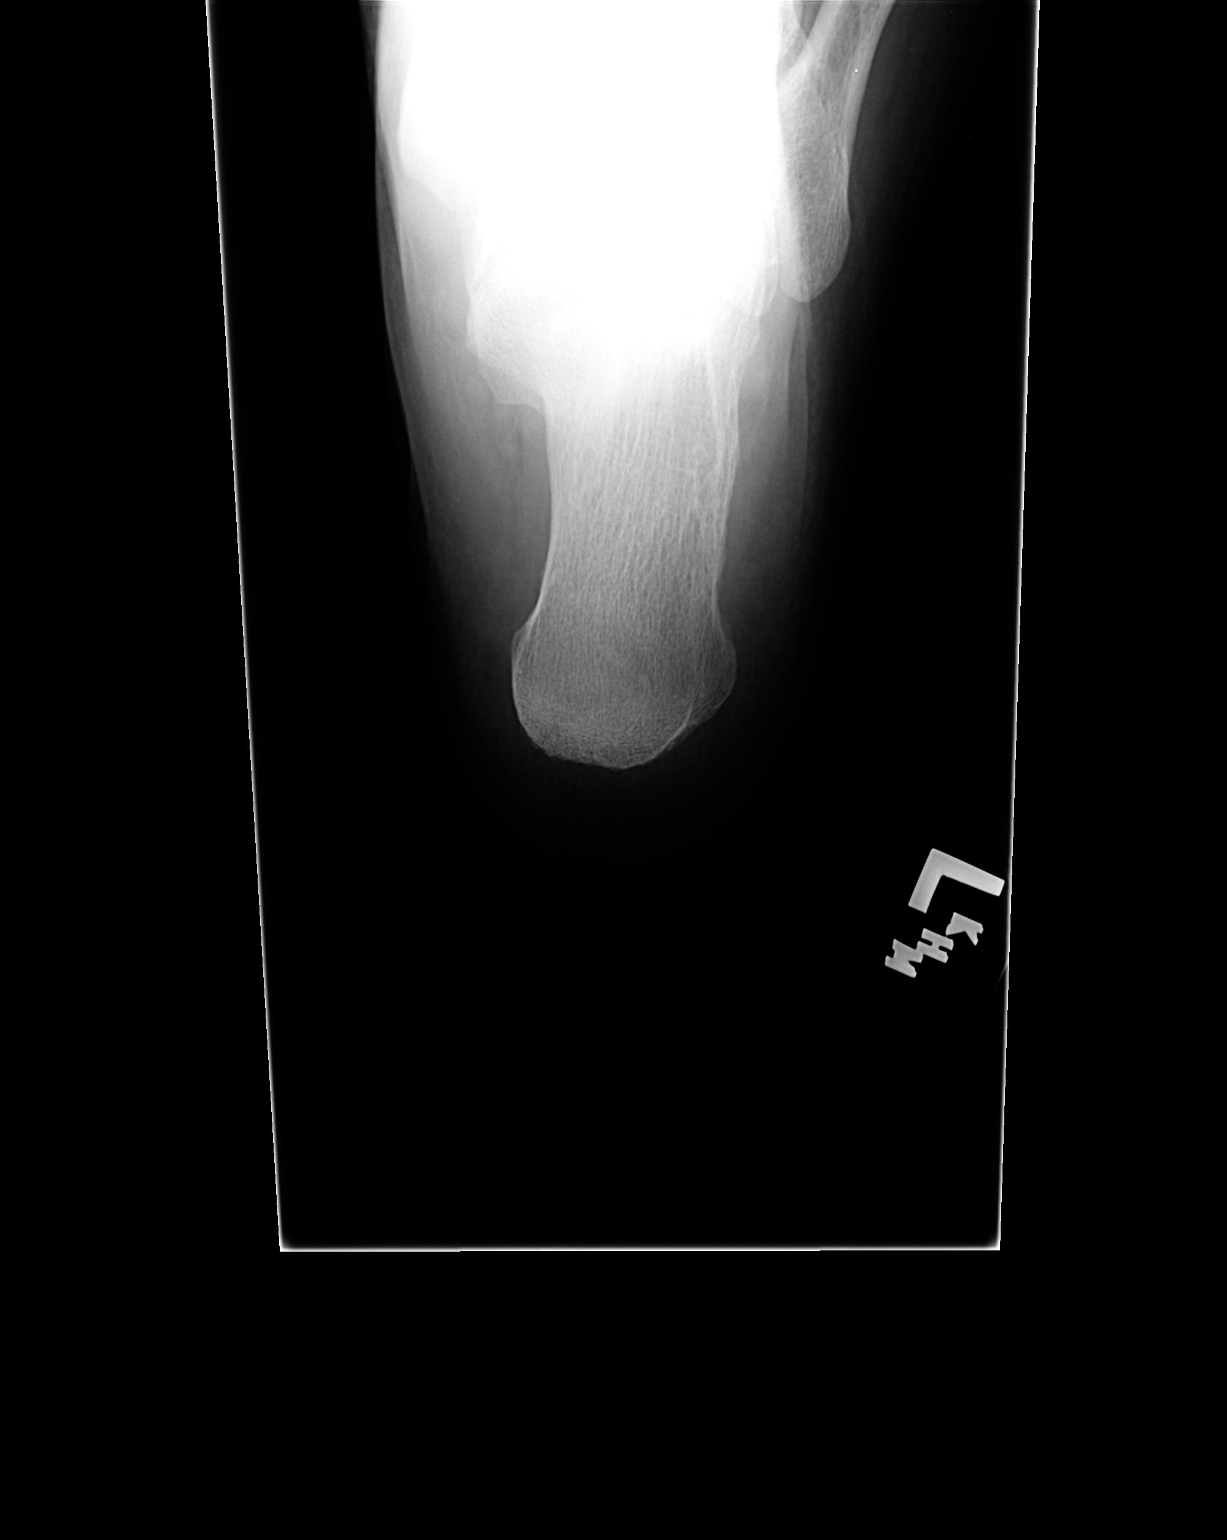

[2 of 2 positions shown; findings below may reference images not displayed]

IMPRESSION: No definite acute abnormalities.

LEFT KNEE 4 VIEWS:

Minimal medial compartment joint space narrowing.
Bone mineralization appears normal.
No fracture, dislocation, or bone destruction.
No knee joint effusion.
IMPRESSION: No acute abnormalities.

LEFT CALCANEUS 2 VIEWS:

Joint spaces preserved.
Bone mineralization normal.
No fracture, dislocation, or bone destruction.
No definite soft tissue abnormalities.
IMPRESSION: No acute abnormalities.

## 2007-07-25 ENCOUNTER — Ambulatory Visit: Payer: Self-pay | Admitting: Internal Medicine

## 2007-08-15 ENCOUNTER — Encounter: Payer: Self-pay | Admitting: Internal Medicine

## 2007-08-15 ENCOUNTER — Ambulatory Visit: Payer: Self-pay | Admitting: Internal Medicine

## 2007-10-27 ENCOUNTER — Ambulatory Visit (HOSPITAL_COMMUNITY): Admission: RE | Admit: 2007-10-27 | Discharge: 2007-10-27 | Payer: Self-pay | Admitting: Internal Medicine

## 2007-10-27 IMAGING — CR DG SACRUM/COCCYX 2+V
3 series · 3 of 3 positions shown · non-contrast
Comparison: CT pelvis [DATE] reviewed.

CLINICAL DATA: Pain.

SACRUM AND COCCYX - 2+ VIEW

[view not recorded (1 of 3)]
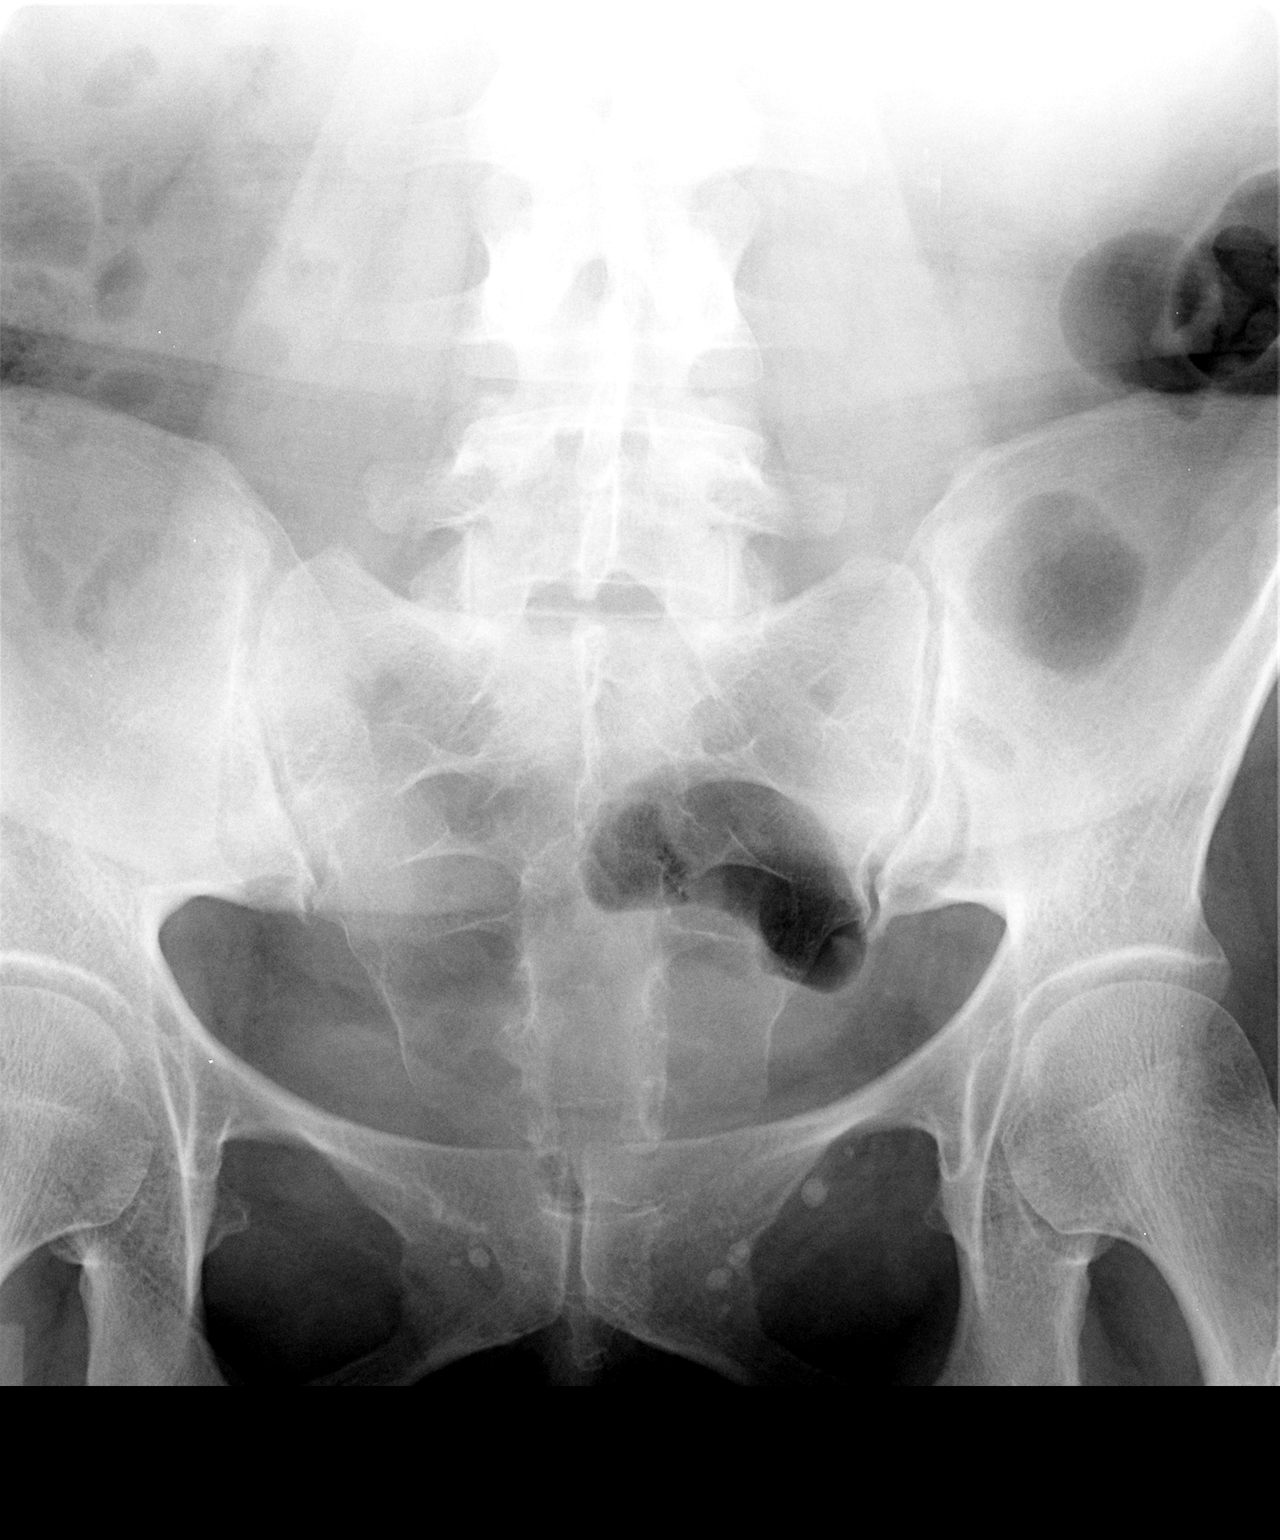

[view not recorded (2 of 3)]
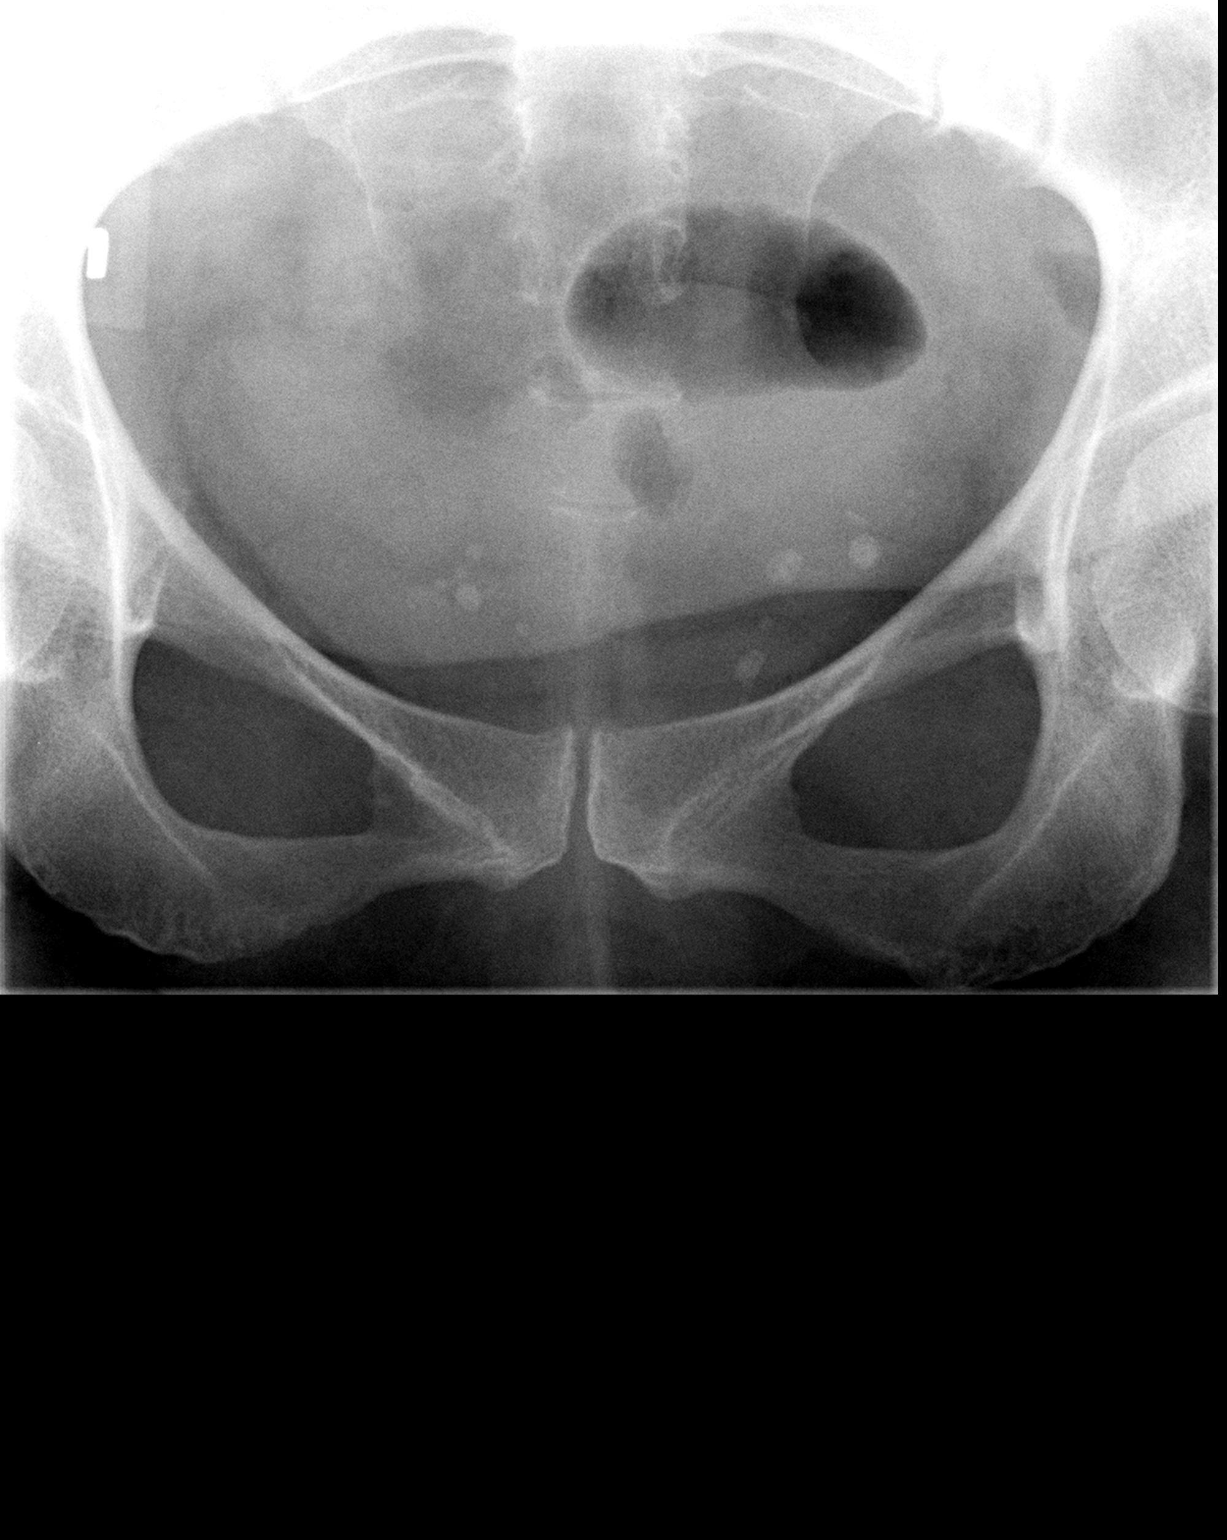

[view not recorded (3 of 3)]
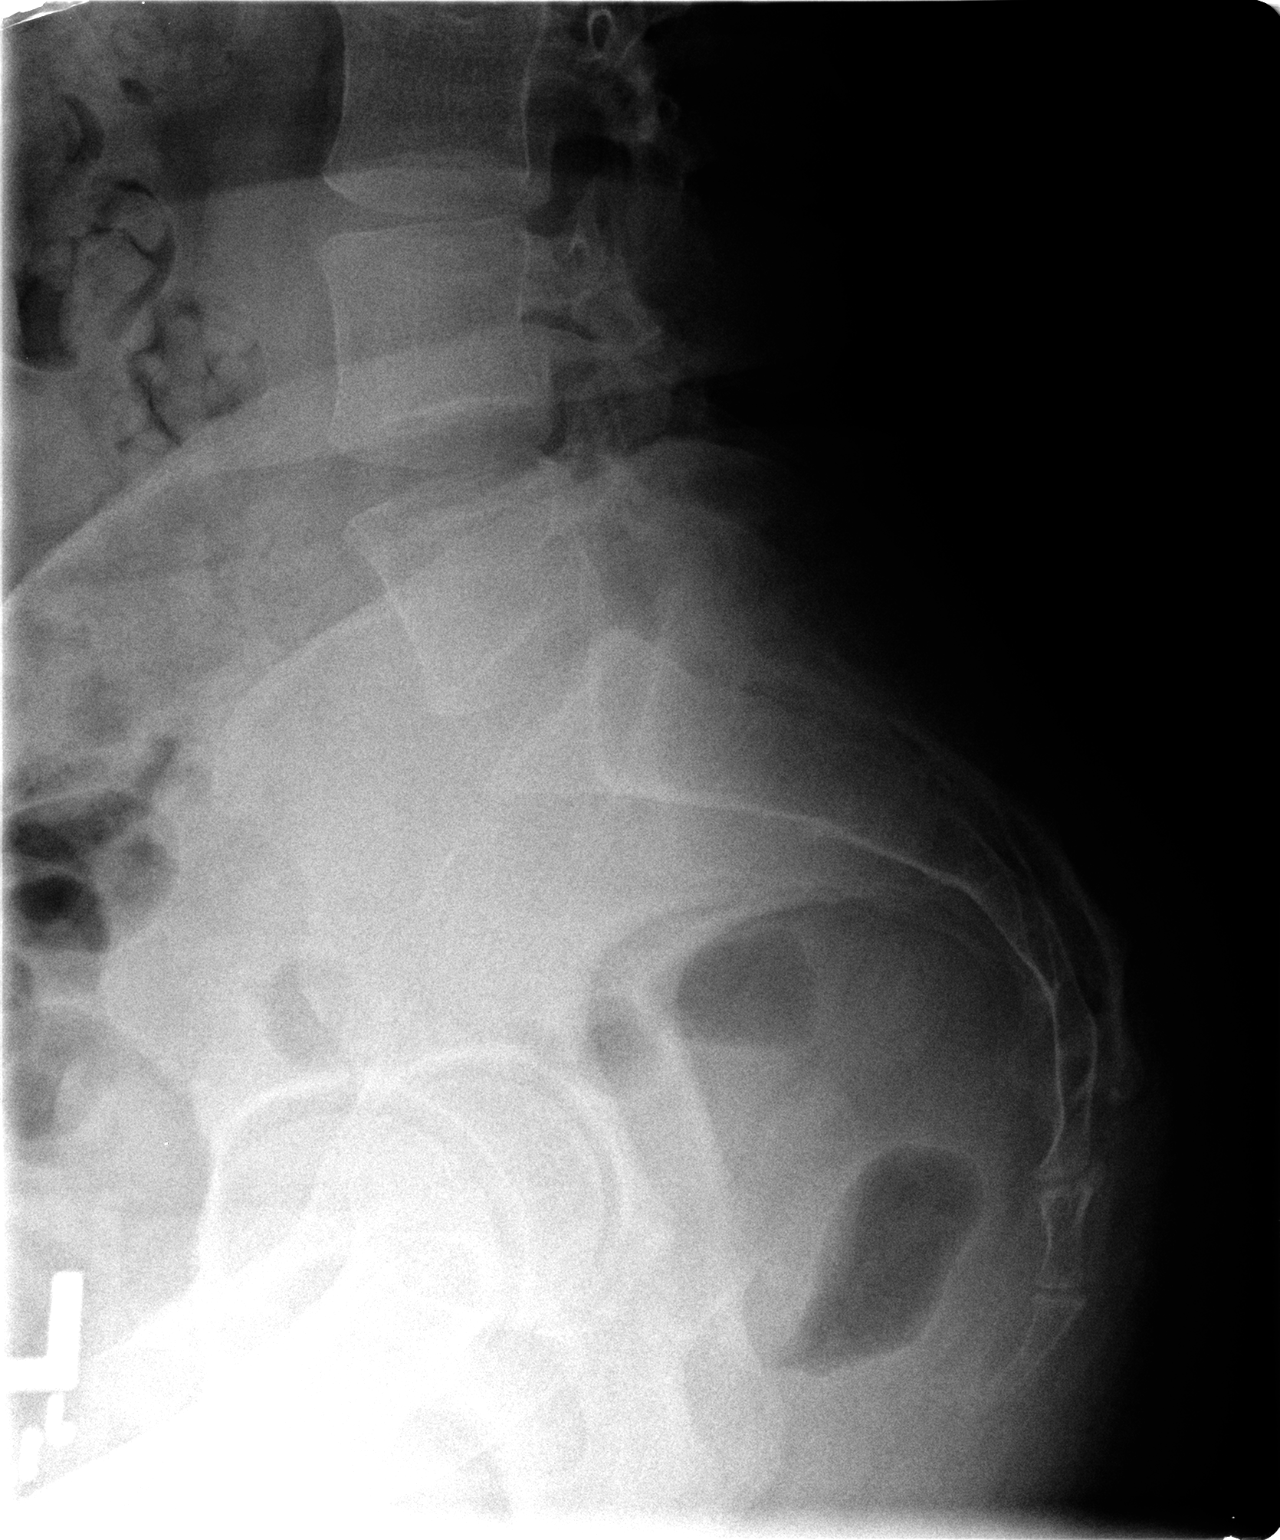

[3 of 3 positions shown; findings below may reference images not displayed]

FINDINGS: The sacrum and coccyx appear normal.  Sacroiliac joints
are also unremarkable in appearance.  Remainder of the visualized
osseous structures appear normal.
IMPRESSION: Negative exam.

## 2008-01-30 ENCOUNTER — Encounter: Payer: Self-pay | Admitting: Internal Medicine

## 2008-06-04 ENCOUNTER — Other Ambulatory Visit: Admission: RE | Admit: 2008-06-04 | Discharge: 2008-06-04 | Payer: Self-pay | Admitting: Obstetrics and Gynecology

## 2008-09-11 ENCOUNTER — Encounter: Payer: Self-pay | Admitting: Internal Medicine

## 2009-03-12 ENCOUNTER — Encounter: Payer: Self-pay | Admitting: Internal Medicine

## 2010-09-03 NOTE — Miscellaneous (Signed)
Summary: donnatal refill  Clinical Lists Changes  Medications: Added new medication of DONNATAL   TABS (BELLADONNA ALK-PHENOBARBITAL) Take 1 tablet up ti four times daily as needed for abdominal pain - Signed Rx of DONNATAL   TABS (BELLADONNA ALK-PHENOBARBITAL) Take 1 tablet up ti four times daily as needed for abdominal pain;  #60 x 1;  Signed;  Entered by: Hortense Ramal CMA;  Authorized by: Hart Carwin MD;  Method used: Printed then faxed to [facility]*, [address], [place], [place], [place]  [address], Ph: [phone], Fax: [phone]    Prescriptions: DONNATAL   TABS (BELLADONNA ALK-PHENOBARBITAL) Take 1 tablet up ti four times daily as needed for abdominal pain  #60 x 1   Entered by:   Hortense Ramal CMA   Authorized by:   Hart Carwin MD   Signed by:   Hortense Ramal CMA on 01/30/2008   Method used:   Printed then faxed to ...       [facility]*       [address]       [place], [place]  [address]       Ph: [phone]       Fax: [phone]   RxID:   [id number]

## 2010-09-03 NOTE — Miscellaneous (Signed)
Summary: Donnatal Refill   Clinical Lists Changes  Medications: Changed medication from DONNATAL   TABS (BELLADONNA ALK-PHENOBARBITAL) Take 1 tablet by mouth four times daily as needed for abdominal pain to DONNATAL   TABS (BELLADONNA ALK-PHENOBARBITAL) Take 1 tablet by mouth four times daily as needed for abdominal pain. MUST HAVE OFFICE VISIT FOR ANY FURTHER REFILLS!!! - Signed Rx of DONNATAL   TABS (BELLADONNA ALK-PHENOBARBITAL) Take 1 tablet by mouth four times daily as needed for abdominal pain. MUST HAVE OFFICE VISIT FOR ANY FURTHER REFILLS!!!;  #60 x 1;  Signed;  Entered by: Hortense Ramal CMA (AAMA);  Authorized by: Hart Carwin MD;  Method used: Electronically to [facility]*, [address], [place], [place], [place]  [address], Ph: [phone], Fax: [phone]    Prescriptions: DONNATAL   TABS (BELLADONNA ALK-PHENOBARBITAL) Take 1 tablet by mouth four times daily as needed for abdominal pain. MUST HAVE OFFICE VISIT FOR ANY FURTHER REFILLS!!!  #60 x 1   Entered by:   Hortense Ramal CMA (AAMA)   Authorized by:   Hart Carwin MD   Signed by:   Hortense Ramal CMA (AAMA) on 03/12/2009   Method used:   Electronically to        [facility]* (retail)       [address]       [place], [place]  [address]       Ph: [phone]       Fax: [phone]   RxID:   [id number]

## 2010-09-03 NOTE — Miscellaneous (Signed)
Summary: Donnatal refill  Clinical Lists Changes  Medications: Changed medication from DONNATAL   TABS (BELLADONNA ALK-PHENOBARBITAL) Take 1 tablet up ti four times daily as needed for abdominal pain to DONNATAL   TABS (BELLADONNA ALK-PHENOBARBITAL) Take 1 tablet by mouth four times daily as needed for abdominal pain - Signed Rx of DONNATAL   TABS (BELLADONNA ALK-PHENOBARBITAL) Take 1 tablet by mouth four times daily as needed for abdominal pain;  #60 x 3;  Signed;  Entered by: Christie Nottingham CMA;  Authorized by: Hart Carwin MD;  Method used: Electronically to [facility]*, [address] [place], [place], [place]  [address], Ph: [phone], Fax: [phone]    Prescriptions: DONNATAL   TABS (BELLADONNA ALK-PHENOBARBITAL) Take 1 tablet by mouth four times daily as needed for abdominal pain  #60 x 3   Entered by:   Christie Nottingham CMA   Authorized by:   Hart Carwin MD   Signed by:   Christie Nottingham CMA on 09/11/2008   Method used:   Electronically to        [facility]* (retail)       [address]       [place], [place]  [address]       Ph: [phone]       Fax: [phone]   RxID:   [id number]

## 2010-12-18 NOTE — Op Note (Signed)
NAME:  Hannah Short, Hannah Short                          ACCOUNT NO.:  [id number]   MEDICAL RECORD NO.:  [id number]                   PATIENT TYPE:  AMB   LOCATION:  DAY                                  FACILITY:  [facility]   PHYSICIAN:  Lazaro Arms, [name]                DATE OF BIRTH:  Dec 19, 1955   DATE OF PROCEDURE:  11/21/2003  DATE OF DISCHARGE:                                 OPERATIVE REPORT   PREOPERATIVE DIAGNOSES:  1. Menometrorrhagia.  2. Dysmenorrhea.  3. Anemia.   POSTOPERATIVE DIAGNOSES:  1. Menometrorrhagia.  2. Dysmenorrhea.  3. Anemia.   PROCEDURE:  Hysteroscopy, dilatation and curettage, endometrial ablation.   SURGEON:  Lazaro Arms, [name]   ANESTHESIA:  Laryngeal mask airway.   FINDINGS:  The patient had a lot of tissue in her endometrium. There were no  polyps or fibroids seen.   DESCRIPTION OF PROCEDURE:  The patient was taken to the operating room and  placed in the supine position where she underwent general endotracheal  anesthesia.  She was placed in the dorsal lithotomy position; prepped and  draped in the usual sterile fashion; bladder was drained.  A Graves speculum  was placed.  Her cervix was grasped.  Her cervix was dilated to allow  passage of a hysteroscope.  A paracervical block was placed.  Normal saline  was used as distending media. The above noted findings seen.  A vigorous  curettage was performed and all tissue was removed.   Endometrial ablation was performed in the usual fashion. It required 70 cc  of D5W heated to 87 degrees Fahrenheit.  Total therapy time was 18 minutes  and 14 seconds heating phase was 8 minutes.  All the fluid was accounted  for.  The patient was awakened from anesthesia taken to the recovery room in  good stable condition.  All counts were correct.      ___________________________________________                                            Lazaro Arms, [name]   LHE/MEDQ  D:  11/21/2003  T:  11/21/2003  Job:   [id number]

## 2010-12-18 NOTE — Op Note (Signed)
[facility]  Patient:    Hannah Short, Hannah Short Visit Number: [id number] MRN: [id number]          Service Type: SUR Location: 3W 0356 02 Attending Physician:  Arlis Porta Dictated by:   Adolph Pollack, [name] Proc. Date: 07/11/01 Admit Date:  07/11/2001   CC:         Dr. Huntley Dec   Operative Report  PREOPERATIVE DIAGNOSIS:  Ventral incisional hernia.  POSTOPERATIVE DIAGNOSIS:  Ventral incisional hernia.  PROCEDURE:  Laparoscopic repair of ventral incisional hernia with Gore-Tex dual mesh.  SURGEON:  Adolph Pollack, [name]  ANESTHESIA:  General.  INDICATIONS FOR PROCEDURE:  Hannah Short is a 55 year old female who has had multiple abdominal surgeries and two previous ventral hernia repairs.  She has had upper abdominal incision and Dr. Orpah Greek repaired one of her hernias in the subxiphoid region.  I repaired another in the periumbilical region.  She now has a defect between these two and presents for repair.  TECHNIQUE:  She was placed supine on the operating table and general anesthesia was administered.  The abdomen was sterilely prepped and draped.  A previous left mid lateral abdominal incision was reincised and taken down to the fascia where incision was made in the anterior fascia.  A pursestring suture of 0 Vicryl was placed around this.  The other fascial layers were dissected bluntly and the peritoneal cavity was entered bluntly under direct vision.  A Hasson trocar was introduced into the peritoneal cavity and a pneumoperitoneum was created by insufflation of CO2 gas.  The laparoscope was introduced and there were noted to be omental adhesions to previous Gore-Tex ventral repair.  I then placed a 10/11 mm trocar in the right mid lateral abdomen and placed the laparoscope through this port.  The mesh repairs were both intact.  There was a visual defect with incarcerated omentum in the area between the proximal and  distal mesh repairs.  I used the blunt dissection and sharp dissection without cautery to reduce the hernia.  I then noticed another defect superior to this and a third defect even superior to that.  All these defects were small.  I then reduced all the omental adhesions without significant blood loss.  Following this, I placed a 5 mm trocar in the right lower quadrant in order to assist with this.  Next, I measured the hernia defects with spinal needles.  The defects altogether measured approximately 7 x 11 cm.  I drew an oval in order to allow for 4 cm overlap around all defects.  I then brought in an appropriate sized piece of Gore-Tex dual mesh into the field, imprinted the oval on it and cut it to shape.  I placed anchoring sutures in the four quadrants of the mesh consisting of 0 Novofil.  I then numbered the mesh and resulting four quadrants on the skin as well as the mesh.  I rolled the mesh up put it in the abdominal cavity and unfurled it with the rough side up.  I then placed small incisions in the four quadrants and grasped the sutures and tied them across the fascial bridge anchoring the mesh into the anterior abdominal wall and more than adequately covering the defects.  Using the spiral tacker I further anchored the mesh in the anterior abdominal wall with an outer layer of tacks around the outer rim and an inner layer as well.  I reinspected the mesh and more than adequately covered the defects  with good fixation.  I noticed no bleeding.  I subsequently removed the trocars and released the pneumoperitoneum.  The left lateral abdominal fascial defect was closed by tightening up and tightened down the pursestring suture.  All skin incisions were closed with 4-0 monocryl subcuticular stitches followed by Steri-Strips and sterile dressings.  She tolerated the procedure well without any apparent complications and was taken to the recovery room in satisfactory  condition. Dictated by:   Adolph Pollack, [name] Attending Physician:  Arlis Porta DD:  07/11/01 TD:  07/11/01 Job: [id number]

## 2010-12-18 NOTE — H&P (Signed)
[facility]  Patient:    Hannah Short, Hannah Short Visit Number: [id number] MRN: [id number]          Service Type: SUR Location: 3W 0356 02 Attending Physician:  Arlis Porta Dictated by:   Adolph Pollack, [name] Admit Date:  07/11/2001                           History and Physical  REASON FOR ADMISSION:  Elective repair of ventral hernia.  HISTORY OF PRESENT ILLNESS:  Hannah Short is a 55 year old female with a complicated Nissen fundoplication in that she had postoperative necrotizing fasciitis.  She has had abdominal wall hernia repairs, one done laparoscopically by me in 1998.  She had one previous to this by Milus Mallick, [name], my partner, in 1997 and this was done open.  She presented to the office complaining of a painful bulge in the epigastric region above her previous hernia repair by me.  She now presents for elective hernia repair.  PAST MEDICAL HISTORY: 1. Gastroesophageal reflux disease. 2. Ventral incisional hernias. 3. Migraine headaches. 4. Urinary tract infections. 5. Fibromyalgia. 6. Arthritis. 7. Allergy-related asthma.  PAST SURGICAL HISTORY: 1. Cesarean section. 2. Nissen fundoplication and appendectomy. 3. Ventral incisional hernia repair x 2, one open and one laparoscopic. 4. Tubal ligation.  ALLERGIES:  TERBUTALINE, BETADINE, IODINE, SULFA MEDICINES, and FLAGYL.  MEDICATIONS: 1. Flexeril one-half tablet q.12h. p.r.n. 2. Prevacid 30 mg b.i.d. 3. Vioxx one a day. 4. FESO4 one q.d. 5. Allegra 180 mg daily.  SOCIAL HISTORY:  She is married and has multiple children.  No tobacco or alcohol abuse.  REVIEW OF SYSTEMS:  As documented in the health history section of the nursing sheet.  PHYSICAL EXAMINATION:  GENERAL APPEARANCE:  A slightly obese female in no acute distress.  Very pleasant and cooperative.  VITAL SIGNS:  Blood pressure 140/80, pulse 82, temperature 97.4 degrees.  SKIN:  Warm and dry without  rash.  HEENT:  Extraocular motions intact.  The sclerae are clear.  HEART:  Regular rate and rhythm without murmur.  RESPIRATORY:  Breath sounds equal and clear.  Respirations unlabored.  ABDOMEN:  Soft.  A palpable fascial defect that is small in the epigastric region.  There is a well-healed midline scar present.  EXTREMITIES:  No edema.  IMPRESSION:  Ventral incisional hernia that appears to be above the previous site, though likely is not a recurrent hernia.  PLAN:  Laparoscopic repair with mesh.  The procedure and the risks were discussed with her previously.  She understands and wants to proceed. Dictated by:   Adolph Pollack, [name] Attending Physician:  Arlis Porta DD:  07/11/01 TD:  07/11/01 Job: [id number]

## 2010-12-18 NOTE — H&P (Signed)
NAME:  Hannah Short, Hannah Short                          ACCOUNT NO.:  [id number]   MEDICAL RECORD NO.:  [id number]                   PATIENT TYPE:  AMB   LOCATION:  DAY                                  FACILITY:  [facility]   PHYSICIAN:  Lazaro Arms, [name]                DATE OF BIRTH:  August 22, 1955   DATE OF ADMISSION:  11/21/2003  DATE OF DISCHARGE:                                HISTORY & PHYSICAL   HISTORY OF PRESENT ILLNESS:  Hannah Short is a 55 year old white female gravida  4, para 4 who has had a set of twins, she has five living children, who is  admitted for a hysteroscopy dilation and curettage endometrial ablation  because of very heavy menstrual periods and significant anemia.  I saw her  originally on November 06, 2003 as a referral from Dr. Ouida Sills.  Her hemoglobin  was down to 8.7 with hematocrit 29.3, her MCV was 78.6.  The patient has  very heavy bleeding, she was at the time bleeding every 2 weeks, very  painful with lots of clotting.  A CT scan of the pelvis revealed a normal-  size uterus and we have decided to proceed with endometrial ablation.  We  did the CT because the patient had significant bloating, she is status post  hiatal hernia surgery and had to have some revision of her anterior  abdominal wall with mesh, I was concerned she might have an obstruction but  that does not seem to be the case, she does have a thickened gallbladder  wall without any gallstones seen.  She has been on Megace since November 06, 2003 - that has stopped her bleeding, her hemoglobin has steadily risen, and  she is on iron twice a day.  The plan is to get her hemoglobin up and the  bleeding stopped permanently and then address the issues with her bloating.  It has gotten better since I originally saw her.   PAST MEDICAL HISTORY:  1. Ulcerative colitis.  2. History of hiatal hernia.  3. Fibromyalgia.   PAST SURGERY:  In 1997 she had a hiatal hernia repair with an appendectomy.  In 1998 she had a  postoperative wound infection.  In 1998 she had scar  revision and abdominal wall revision.  In 2002 she had an anterior abdominal  wall hernia and she had a mesh placed.  In 1995 she had a C-section/tubal  ligation.   PAST OBSTETRICAL HISTORY:  She had the one C-section and then three vaginal  deliveries.   ALLERGIES:  SULFA DRUGS, FLAGYL, and BETADINE.   MEDICATIONS:  Iron, Zelnorm, Ativan, stool softener, and Levbid.   PHYSICAL EXAMINATION:  HEENT:  Unremarkable.  THYROID:  Normal.  LUNGS:  Clear.  HEART:  Regular rate and rhythm without regurgitation or gallop.  BREASTS:  Without mass, discharge, skin changes.  ABDOMEN:  Mildly distended but benign.  PELVIC:  Negative.  EXTREMITIES:  No edema.  NEUROLOGIC:  Grossly intact.   IMPRESSION:  1. Menometrorrhagia.  2. Dysmenorrhea.  3. Anemia.   PLAN:  The patient is admitted for hysteroscopy dilation and curettage  endometrial ablation.  She understands the risks, benefits, indications,  alternatives and we will proceed.     ___________________________________________                                         Lazaro Arms, [name]   Loraine Maple  D:  11/20/2003  T:  11/21/2003  Job:  [id number]

## 2013-12-19 ENCOUNTER — Ambulatory Visit (INDEPENDENT_AMBULATORY_CARE_PROVIDER_SITE_OTHER): Payer: 59 | Admitting: Neurology

## 2013-12-19 ENCOUNTER — Encounter: Payer: Self-pay | Admitting: Neurology

## 2013-12-19 VITALS — BP 137/82 | HR 69 | Ht 63.5 in | Wt 184.0 lb

## 2013-12-19 DIAGNOSIS — G43909 Migraine, unspecified, not intractable, without status migrainosus: Secondary | ICD-10-CM | POA: Insufficient documentation

## 2013-12-19 DIAGNOSIS — M797 Fibromyalgia: Secondary | ICD-10-CM

## 2013-12-19 DIAGNOSIS — IMO0001 Reserved for inherently not codable concepts without codable children: Secondary | ICD-10-CM

## 2013-12-19 MED ORDER — NORTRIPTYLINE HCL 10 MG PO CAPS: ORAL_CAPSULE | ORAL | Status: DC

## 2013-12-19 MED ORDER — RIZATRIPTAN BENZOATE 10 MG PO TBDP: 10.0000 mg | ORAL_TABLET | ORAL | Status: DC | PRN

## 2013-12-19 NOTE — Progress Notes (Signed)
PATIENT: ODYSSEY VASBINDER DOB: 1955-10-10  HISTORICAL  MARYSA WESSNER is a 58 years old right-handed female, alone at today's visit, she is referred by her primary care PA Gilman Schmidt for evaluation of headaches,  She had a history of migraine headaches since 58 years old, always start it at right upper lobe, spreading to right occipital, at right frontal retro-orbital area, severe pounding headaches, was associated light noise sensitivity, sensitive to smell, sometimes to the point of nausea, throwing up, her headache can last couple hours, even for few days,    Trigger for her headaches are hungry, strong smell, such as perfume, bright light, certain food, such as home, they can, sleep deprivation, stress,   Old past 2 years, she is under a lot of stress, taking care of her sick daughter, and her elderly father-in-law, she woke up multiple times during the middle of the night attending there needs, she had frequent headaches, 3 times every week,   She is now taking tramadol, Tylenol as needed, sleeping dark quiet room, ice pack always help   [facility], gabapentin 500 mg twice a day for many years, there was no significant improvement in her headaches, Topamax caused bilateral lower extremity burning sensation, nightmare, she has tried Imitrex, causing her nauseous, point injection, was limited help.   She denies significant gait difficulty, no visual loss,   But she reported 2 episodes of visual phenomena in since April 2014, 1 in April last year, 3 over past 1 months, they are very similar to each other, she lying in her bed light was turned off, she sees small sparkling stars all over her visual field, no significant headaches, lasting for hours, until she went to sleep, she was evaluated by ophthalmologist twice, and her primary care physician, there was no significant abnormality found,   Laboratory evaluation in April 20 fourth 2013,  showed normal CBC, CMP, TSH  We have reviewed MRI from [facility] in Dec 12 2013, there was no acute intracranial abnormality.    REVIEW OF SYSTEMS: Full 14 system review of systems performed and notable only for old not enough sleep, restless, insomnia, headaches, dizziness, feeling hot, flushing, internodes, achy muscles, left joints pain, energy, fibromyalgia   ALLERGIES: Allergies  Allergen Reactions  . Betanidine [Bethanidine]   . Flagyl [Metronidazole]   . Sulfa Antibiotics     HOME MEDICATIONS: No current outpatient prescriptions on file prior to visit.   No current facility-administered medications on file prior to visit.    PAST MEDICAL HISTORY: Past Medical History  Diagnosis Date  . Migraine   . Fibromyalgia   . Neck pain     PAST SURGICAL HISTORY: Past Surgical History  Procedure Laterality Date  . Cesarean section    . Hernia repair      FAMILY HISTORY: Family History  Problem Relation Age of Onset  . Heart failure Father   . Heart failure Mother     SOCIAL HISTORY:  History   Social History  . Marital Status: Married    Spouse Name: [name]    Number of Children: 5  . Years of Education: 12   Occupational History  . 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 Smoking status: Never Smoker   . Smokeless tobacco: Never Used  . Alcohol Use: No  . Drug Use: No  . Sexual Activity: Not on file   Other Topics Concern  .  Not on file   Social History Narrative   Patient lives at home with her husband [name]) and father in law and [name] her daughter.   Patient is taking care of her father full time and disabled daughter.   Education some college.    Right handed.   Caffeine one cup of coffee daily.          PHYSICAL EXAM   Filed Vitals:   12/19/13 1310  BP: 137/82  Pulse: 69  Height: 5' 3.5" (1.613 m)  Weight: 184 lb (83.462 kg)    Not recorded    Body mass index is 32.08  kg/(m^2).   Generalized: In no acute distress  Neck: Supple, no carotid bruits   Cardiac: Regular rate rhythm  Pulmonary: Clear to auscultation bilaterally  Musculoskeletal: No deformity  Neurological examination  Mentation: Alert oriented to time, place, history taking, and causual conversation  Cranial nerve II-XII: Pupils were equal round reactive to light. Extraocular movements were full.  Visual field were full on confrontational test. Bilateral fundi were sharp.  Facial sensation and strength were normal. Hearing was intact to finger rubbing bilaterally. Uvula tongue midline.  Head turning and shoulder shrug and were normal and symmetric.Tongue protrusion into cheek strength was normal.  Motor: Normal tone, bulk and strength.  Sensory: Intact to fine touch, pinprick, preserved vibratory sensation, and proprioception at toes.  Coordination: Normal finger to nose, heel-to-shin bilaterally there was no truncal ataxia  Gait: Rising up from seated position without assistance, normal stance, without trunk ataxia, moderate stride, good arm swing, smooth turning, able to perform tiptoe, and heel walking without difficulty.   Romberg signs: Negative  Deep tendon reflexes: Brachioradialis 2/2, biceps 2/2, triceps 2/2, patellar 2/2, Achilles 2/2, plantar responses were flexor bilaterally.   DIAGNOSTIC DATA (LABS, IMAGING, TESTING) - I reviewed patient records, labs, notes, testing and imaging myself where available.   ASSESSMENT AND PLAN  MARLISE FAHR is a 58 y.o. female complains of frequent headaches, some visual phenomenon, history of migraine headaches since 58 years old, essentially normal neurological examinations, and normal MRI of the brain,  1, her headache most consistent with migraine, will start preventive medication nortriptyline, 10 mg, titrating to 20 mg every night 2. Maxalt as needed   3. She also complains of right-sided neck pain, radiating pain to bilateral  shoulder, and bilateral arm, possibility also including cervical radiculopathies, if she continued to complains of neck pain, radiating pain to bilateral upper extremity, may consider MRI of cervical 4 return to clinic with Hoyle Sauer in 3 months.     Marcial Pacas, [name] [name]  [facility] [address], [place], [place] [address] [phone]

## 2014-04-02 ENCOUNTER — Ambulatory Visit: Payer: Self-pay | Admitting: Nurse Practitioner

## 2014-04-16 ENCOUNTER — Ambulatory Visit (INDEPENDENT_AMBULATORY_CARE_PROVIDER_SITE_OTHER): Payer: 59 | Admitting: Nurse Practitioner

## 2014-04-16 ENCOUNTER — Encounter: Payer: Self-pay | Admitting: Nurse Practitioner

## 2014-04-16 VITALS — BP 123/79 | HR 84 | Ht 64.5 in | Wt 186.0 lb

## 2014-04-16 DIAGNOSIS — G43019 Migraine without aura, intractable, without status migrainosus: Secondary | ICD-10-CM

## 2014-04-16 NOTE — Patient Instructions (Signed)
1. her headache most consistent with migraine, will start preventive medication nortriptyline,  20 mg every night  2. Maxalt as needed , no more than 2 in 24 hours. 3. Follow up in 6 months, sooner as needed.

## 2014-04-16 NOTE — Progress Notes (Signed)
PATIENT: Hannah Short DOB: 12/04/1955  REASON FOR VISIT: routine follow up for Migraine HISTORY FROM: patient  HISTORY OF PRESENT ILLNESS: Hannah Short is a 58 years old right-handed female, alone at today's visit, she is referred by her primary care PA Gilman Schmidt for evaluation of headaches.  She had a history of migraine headaches since 58 years old, always start it at right upper lobe, spreading to right occipital, at right frontal retro-orbital area, severe pounding headaches, was associated light noise sensitivity, sensitive to smell, sometimes to the point of nausea, throwing up, her headache can last couple hours, even for few days,  Trigger for her headaches are hungry, strong smell, such as perfume, bright light, certain food, such as home, they can, sleep deprivation, stress.  Over past 2 years, she is under a lot of stress, taking care of her sick daughter, and her elderly father-in-law, she woke up multiple times during the middle of the night attending their needs, she had frequent headaches, 3 times every week,  She is now taking tramadol, Tylenol as needed, sleeping dark quiet room, ice pack always help.  Previously she was treated at [place], gabapentin 500 mg twice a day for many years, there was no significant improvement in her headaches, Topamax caused bilateral lower extremity burning sensation, nightmare, she has tried Imitrex, causing her nauseous, point injection, was limited help.  She denies significant gait difficulty, no visual loss,  But she reported 2 episodes of visual phenomena in since April 2014, 1 in April last year, 3 over past 1 months, they are very similar to each other, she lying in her bed light was turned off, she sees small sparkling stars all over her visual field, no significant headaches, lasting for hours, until she went to sleep, she was evaluated by ophthalmologist twice, and her primary care physician, there was no  significant abnormality found,  Laboratory evaluation in April 20 fourth 2013, showed normal CBC, CMP, TSH  We have reviewed MRI from [facility] in Dec 12 2013, there was no acute intracranial abnormality.   Update 04/16/14 (LL): Since last visit, patient has had great response to Nortriptyline. She has only had 3 Migraines in the last 3 months.  Two were this past week when her daughter with spina bifida was in the hospital, due to stress. She is using Nortriptyline 20 mg each night and Maxalt for rescue. Insomnia is improved as well and restless leg symptoms are improved as well. She is very pleased with current treatment.  REVIEW OF SYSTEMS: Full 14 system review of systems performed and notable only for restless legs, moles.  ALLERGIES: Allergies  Allergen Reactions  . Betanidine [Bethanidine]   . Flagyl [Metronidazole]   . Sulfa Antibiotics     HOME MEDICATIONS: Outpatient Prescriptions Prior to Visit  Medication Sig Dispense Refill  . cetirizine (ZYRTEC) 10 MG tablet Take 10 mg by mouth as needed for allergies.      Marland Kitchen nortriptyline (PAMELOR) 10 MG capsule One tablet every night for one week, and then 2 tablets every night  60 capsule  12  . Pantoprazole Sodium (PROTONIX PO) Take by mouth as needed.      . rizatriptan (MAXALT-MLT) 10 MG disintegrating tablet Take 1 tablet (10 mg total) by mouth as needed for migraine. May repeat in 2 hours if needed  15 tablet  12  . traMADol (ULTRAM) 50 MG tablet Take by mouth every 6 (six) hours as needed.  No facility-administered medications prior to visit.    PHYSICAL EXAM Filed Vitals:   04/16/14 0949  BP: 123/79  Pulse: 84  Height: 5' 4.5" (1.638 m)  Weight: 186 lb (84.369 kg)   Body mass index is 31.45 kg/(m^2).  Generalized: In no acute distress  Neck: Supple, no carotid bruits  Cardiac: Regular rate rhythm  Pulmonary: Clear to auscultation bilaterally  Musculoskeletal: No deformity   Neurological examination    Mentation: Alert oriented to time, place, history taking, and causual conversation  Cranial nerve II-XII: Pupils were equal round reactive to light. Extraocular movements were full. Visual field were full on confrontational test. Bilateral fundi were sharp. Facial sensation and strength were normal. Hearing was intact to finger rubbing bilaterally. Uvula tongue midline. Head turning and shoulder shrug and were normal and symmetric.Tongue protrusion into cheek strength was normal.  Motor: Normal tone, bulk and strength.  Sensory: Intact to fine touch, pinprick, preserved vibratory sensation, and proprioception at toes.  Coordination: Normal finger to nose, heel-to-shin bilaterally there was no truncal ataxia  Gait: Rising up from seated position without assistance, normal stance, without trunk ataxia, moderate stride, good arm swing, smooth turning, able to perform tiptoe, and heel walking without difficulty.  Romberg signs: Negative  Deep tendon reflexes: Brachioradialis 2/2, biceps 2/2, triceps 2/2, patellar 2/2, Achilles 2/2, plantar responses were flexor bilaterally.   ASSESSMENT: Hannah Short is a 58 y.o. Caucasian female complains of frequent headaches, some visual phenomenon, history of migraine headaches since 58 years old, essentially normal neurological examinations, and normal MRI of the brain. She also complains of right-sided neck pain, radiating pain to bilateral shoulder, and bilateral arm, possibility also including cervical radiculopathies, if she continued to complains of neck pain, radiating pain to bilateral upper extremity, may consider MRI of cervical.   PLAN: 1. her headache most consistent with migraine, continue preventive medication nortriptyline, 20 mg every night. 2. Maxalt as needed  3. return to clinic in 6 months, sooner as needed.  Rudi Rummage Myrtha Tonkovich, MSN, FNP-BC, A/GNP-C 04/16/2014, 10:06 AM [facility] [address] [place], [place]  [address] [phone]  Note: This document was prepared with digital dictation and possible smart phrase technology. Any transcriptional errors that result from this process are unintentional.

## 2014-05-03 ENCOUNTER — Ambulatory Visit: Payer: 59 | Admitting: Nurse Practitioner

## 2014-05-23 ENCOUNTER — Encounter: Payer: Self-pay | Admitting: *Deleted

## 2014-10-15 ENCOUNTER — Ambulatory Visit: Payer: 59 | Admitting: Neurology

## 2014-10-24 ENCOUNTER — Ambulatory Visit: Payer: Self-pay | Admitting: Neurology

## 2014-11-08 ENCOUNTER — Ambulatory Visit (INDEPENDENT_AMBULATORY_CARE_PROVIDER_SITE_OTHER): Payer: 59 | Admitting: Neurology

## 2014-11-08 ENCOUNTER — Encounter: Payer: Self-pay | Admitting: Neurology

## 2014-11-08 VITALS — BP 142/85 | HR 71 | Temp 97.9°F | Ht 64.5 in | Wt 183.0 lb

## 2014-11-08 DIAGNOSIS — G43009 Migraine without aura, not intractable, without status migrainosus: Secondary | ICD-10-CM | POA: Diagnosis not present

## 2014-11-08 NOTE — Progress Notes (Signed)
[facility]    Provider:  Dr Jaynee Eagles Referring Provider: Lanelle Bal, PA-C Primary Care Physician:  Tonye Becket  CC:  Migraine  She is on Nortriptyline. She has been drinking more water and she feels better. She has Fibromyalgia. She hasn't had a migraine for 6 months. She takes tylenol then tramadol for headaches/migraines, total 2 in the last 6 months and an additional one she has needed a maxalt that was a "butt kicker". Her neck pain is better since she is drinking water and changed her lifestyle. A lot of the stress is gone. 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 Here with daughter today. They talk about how thery have changed their lives and how this reflects in their health and their improvement.   HISTORICAL  Hannah Short is a 59 years old right-handed female, alone at today's visit, she is referred by her primary care PA Gilman Schmidt for evaluation of headaches,  She had a history of migraine headaches since 59 years old, always start it at right upper lobe, spreading to right occipital, at right frontal retro-orbital area, severe pounding headaches, was associated light noise sensitivity, sensitive to smell, sometimes to the point of nausea, throwing up, her headache can last couple hours, even for few days,   Trigger for her headaches are hungry, strong smell, such as perfume, bright light, certain food, such as home, they can, sleep deprivation, stress,   Old past 2 years, she is under a lot of stress, taking care of her sick daughter, and her elderly father-in-law, she woke up multiple times during the middle of the night attending there needs, she had frequent headaches, 3 times every week,   She is now taking tramadol, Tylenol as needed, sleeping dark quiet room, ice pack always help   [facility], gabapentin 500 mg twice a day for many years, there was no significant improvement in her headaches,  Topamax caused bilateral lower extremity burning sensation, nightmare, she has tried Imitrex, causing her nauseous, point injection, was limited help.   She denies significant gait difficulty, no visual loss,   But she reported 2 episodes of visual phenomena in since April 2014, 1 in April last year, 3 over past 1 months, they are very similar to each other, she lying in her bed light was turned off, she sees small sparkling stars all over her visual field, no significant headaches, lasting for hours, until she went to sleep, she was evaluated by ophthalmologist twice, and her primary care physician, there was no significant abnormality found,   Laboratory evaluation in April 20 fourth 2013, showed normal CBC, CMP, TSH  We have reviewed MRI from [facility] in Dec 12 2013, there was no acute intracranial abnormality  Review of Systems: Patient complains of symptoms per HPI as well as the following symptoms: No CP, No SOB. Pertinent negatives per HPI. All others negative.   History   Social History  . Marital Status: Married    Spouse Name: [name]  . Number of Children: 5  . Years of Education: 12   Occupational History  . employed    Social History Main Topics  . Smoking status: Never Smoker   . Smokeless tobacco: Never Used  . Alcohol Use: No  . Drug Use: No  . Sexual Activity: Not on file   Other Topics Concern  . Not on file   Social History Narrative   Patient lives at home  with her husband [name]) and father in Sports coach and [name] her daughter.   Patient is taking care of her father full time and disabled daughter.   Education some college.    Right handed.   Caffeine one cup of coffee daily.          Family History  Problem Relation Age of Onset  . Heart failure Father   . Heart failure Mother     Past Medical History  Diagnosis Date  . Migraine   . Fibromyalgia   . Neck pain     Past Surgical History  Procedure Laterality Date  . Cesarean section     . Hernia repair      Current Outpatient Prescriptions  Medication Sig Dispense Refill  . cetirizine (ZYRTEC) 10 MG tablet Take 10 mg by mouth as needed for allergies.    Marland Kitchen nortriptyline (PAMELOR) 10 MG capsule One tablet every night for one week, and then 2 tablets every night 60 capsule 12  . Pantoprazole Sodium (PROTONIX PO) Take by mouth as needed.    . rizatriptan (MAXALT-MLT) 10 MG disintegrating tablet Take 1 tablet (10 mg total) by mouth as needed for migraine. May repeat in 2 hours if needed 15 tablet 12  . traMADol (ULTRAM) 50 MG tablet Take by mouth every 6 (six) hours as needed.     No current facility-administered medications for this visit.    Allergies as of 11/08/2014 - Review Complete 04/16/2014  Allergen Reaction Noted  . Betanidine [bethanidine]  12/19/2013  . Flagyl [metronidazole]  12/19/2013  . Sulfa antibiotics  12/19/2013    Vitals: There were no vitals taken for this visit. Last Weight:  Wt Readings from Last 1 Encounters:  04/16/14 186 lb (84.369 kg)   Last Height:   Ht Readings from Last 1 Encounters:  04/16/14 5' 4.5" (1.638 m)    Speech:    Speech is normal; fluent and spontaneous with normal comprehension.  Cognition:    The patient is oriented to person, place, and time;     recent and remote memory intact;     language fluent;     normal attention, concentration,     fund of knowledge Cranial Nerves:    The pupils are equal, round, and reactive to light. The fundi are normal and spontaneous venous pulsations are present. Visual fields are full to finger confrontation. Extraocular movements are intact. Trigeminal sensation is intact and the muscles of mastication are normal. The face is symmetric. The palate elevates in the midline. Hearing intact. Voice is normal. Shoulder shrug is normal. The tongue has normal motion without fasciculations.       Assessment/Plan:  ASSESSMENT AND PLAN  LERIN JECH is a 59 y.o. female complains of  frequent headaches, some visual phenomenon, history of migraine headaches since 59 years old, essentially normal neurological examinations, and normal MRI of the brain, ALL RESOLVED  1, her headache most consistent with migraine, started preventive medication nortriptyline, 10 mg, titrating to 20 mg every night - she stopped it.  2. Maxalt as needed  3. She also complains of right-sided neck pain, radiating pain to bilateral shoulder, and bilateral arm, possibility also including cervical radiculopathies, if she continued to complains of neck pain, radiating pain to bilateral upper extremity, may consider MRI of cervical - feels better, will follow clinically 4 return to clinic as needed   Sarina Ill, MD  [facility] [address] [place], [place] [address]  Phone [phone] Fax  [phone]  A total of 30minutes was spent face-to-face with this patient. Over half this time was spent on counseling patient on the migraine diagnosis and different diagnostic and therapeutic options available.

## 2014-11-08 NOTE — Patient Instructions (Signed)
Overall you are doing fairly well but I do want to suggest a few things today:   Remember to drink plenty of fluid, eat healthy meals and do not skip any meals. Try to eat protein with a every meal and eat a healthy snack such as fruit or nuts in between meals. Try to keep a regular sleep-wake schedule and try to exercise daily, particularly in the form of walking, 20-30 minutes a day, if you can.    I would like to see you back as needed, sooner if we need to. Please call us with any interim questions, concerns, problems, updates or refill requests.   Our phone number is [phone]. We also have an after hours call service for urgent matters and there is a physician on-call for urgent questions. For any emergencies you know to call 911 or go to the nearest emergency room

## 2014-11-12 NOTE — Addendum Note (Signed)
Addended by: Sarina Ill B on: 11/12/2014 07:09 PM   Modules accepted: Medications

## 2015-02-10 ENCOUNTER — Other Ambulatory Visit: Payer: Self-pay | Admitting: Neurology

## 2015-08-07 ENCOUNTER — Encounter: Payer: Self-pay | Admitting: Internal Medicine

## 2016-03-04 ENCOUNTER — Other Ambulatory Visit: Payer: Self-pay | Admitting: Neurology

## 2016-11-09 ENCOUNTER — Encounter: Payer: Self-pay | Admitting: Gastroenterology

## 2016-11-19 ENCOUNTER — Ambulatory Visit (INDEPENDENT_AMBULATORY_CARE_PROVIDER_SITE_OTHER): Payer: 59 | Admitting: Gastroenterology

## 2016-11-19 ENCOUNTER — Encounter: Payer: Self-pay | Admitting: Gastroenterology

## 2016-11-19 ENCOUNTER — Other Ambulatory Visit (INDEPENDENT_AMBULATORY_CARE_PROVIDER_SITE_OTHER): Payer: 59

## 2016-11-19 VITALS — BP 100/60 | HR 80 | Ht 64.5 in | Wt 189.0 lb

## 2016-11-19 DIAGNOSIS — R0789 Other chest pain: Secondary | ICD-10-CM

## 2016-11-19 DIAGNOSIS — R194 Change in bowel habit: Secondary | ICD-10-CM

## 2016-11-19 DIAGNOSIS — R140 Abdominal distension (gaseous): Secondary | ICD-10-CM | POA: Diagnosis not present

## 2016-11-19 DIAGNOSIS — R109 Unspecified abdominal pain: Secondary | ICD-10-CM

## 2016-11-19 LAB — IGA: IgA: 440 mg/dL — ABNORMAL HIGH (ref 68–378)

## 2016-11-19 MED ORDER — NA SULFATE-K SULFATE-MG SULF 17.5-3.13-1.6 GM/177ML PO SOLN: 1.0000 | Freq: Once | ORAL | 0 refills | Status: AC

## 2016-11-19 NOTE — Progress Notes (Signed)
11/19/2016 Hannah Short [id number] October 17, 1955  Referred by Dr. Quintin Short  HISTORY OF PRESENT ILLNESS:  This is a 61 year old female who is previously known to Hannah Short.  She was under the impression that she had "ulcerative colitis" but it appears that she had an episode of ischemic colitis in 1993.  She has other colonoscopies since then that have been normal.  Most recent was 08/2007, which was normal; random biopsies also normal at that time.  She presents to our office today with several complaints.  First, she complains of left sided abdominal pain.  Says that sometimes it is very severe and "catches".  Also complains of constant abdominal bloating/distention.  Has some loose stools as well.  Was taking bentyl but was under the impression that it was for diarrhea and says that it did not really help her diarrhea so she is no longer using that.  Takes simethicone for the gas/bloating but asking if there is something different that she can try.  She has history of hernia repair for large abdominal scars and tells me that she has mesh covering her entire abdomen.  She also complains of chest pain that she describes as a constant squeezing or pressure.  Apparently had a normal EKG and stress test this year.  Pain does not worsen with exertion.  She denies any heartburn or reflux senstion  Says that she is constantly uncomfortable.  Recent labs including CBC, CMP, TSH, CRP, and sed rate were WNL's.  Past Medical History:  Diagnosis Date  . Fibromyalgia   . Migraine   . Neck pain    Past Surgical History:  Procedure Laterality Date  . CESAREAN SECTION    . HERNIA REPAIR      reports that she has never smoked. She has never used smokeless tobacco. She reports that she does not drink alcohol or use drugs. family history includes Heart failure in her father and mother. Allergies  Allergen Reactions  . Betanidine [Bethanidine]   . Flagyl [Metronidazole]   . Sulfa Antibiotics        Outpatient Encounter Prescriptions as of 11/19/2016  Medication Sig  . cetirizine (ZYRTEC) 10 MG tablet Take 10 mg by mouth as needed for allergies.  Marland Kitchen losartan (COZAAR) 25 MG tablet Take 25 mg by mouth daily.  . rizatriptan (MAXALT-MLT) 10 MG disintegrating tablet DISSOLVE 1 TABLET UNDER TONGUE AT ONSET OF HEADACHE; MAY REPEAT IN 2 HOURS; MAX: 2 TABLETS/24 HOURS.  . simethicone (MYLICON) 583 MG chewable tablet Chew 125 mg by mouth every 6 (six) hours as needed for flatulence.  . [DISCONTINUED] Pantoprazole Sodium (PROTONIX PO) Take by mouth as needed.  . [DISCONTINUED] traMADol (ULTRAM) 50 MG tablet Take by mouth every 6 (six) hours as needed.  . Na Sulfate-K Sulfate-Mg Sulf 17.5-3.13-1.6 GM/180ML SOLN Take 1 kit by mouth once.  . [DISCONTINUED] nortriptyline (PAMELOR) 10 MG capsule One tablet every night for one week, and then 2 tablets every night (Patient not taking: Reported on 11/12/2014)   No facility-administered encounter medications on file as of 11/19/2016.      REVIEW OF SYSTEMS  : All other systems reviewed and negative except where noted in the History of Present Illness.   PHYSICAL EXAM: BP 100/60   Pulse 80   Ht 5' 4.5" (1.638 m)   Wt 189 lb (85.7 kg)   BMI 31.94 kg/m  General: Well developed white female in no acute distress Head: Normocephalic and atraumatic Eyes:  Sclerae anicteric, conjunctiva pink.  Ears: Normal auditory acuity Lungs: Clear throughout to auscultation; no increased WOB. Heart: Regular rate and rhythm Abdomen: Soft, somewhat distended/bloated.  BS present.  Non-tender.  Large surgical scars noted on abdomen. Rectal:  Will be done at the time of colonoscopy. Musculoskeletal: Symmetrical with no gross deformities  Skin: No lesions on visible extremities Extremities: No edema  Neurological: Alert oriented x 4, grossly non-focal Psychological:  Alert and cooperative. Normal mood and affect  ASSESSMENT AND PLAN: -Abdominal pain, bloating, loose  stools:  I am curious if some of her abdominal pain is related to adhesions/scar tissue.  No colonoscopy in 9 over 9 years.  Will schedule that with Dr. Havery Short.  Will check celiac labs.  She is going to try IBgard for her boating. -Atypical chest pain:  Describes it as pressure.  Had negative EKG and stress test recently.  Not worsened by exertion.  Unsure if GI related.  Denies reflux type symptoms.  Will perform EGD as well to clear the GI tract.  Will hold off with PPI for now.    *The risks, benefits, and alternatives to EGD and colonoscopy were discussed with the patient and she consents to proceed.  **Of note, procedures were scheduled with Dr. Havery Short instead of Dr. Henrene Short because Dr. Henrene Short does not have anything available for a procedure until July.  CC:  Hannah Short, Silvestre Moment, MD

## 2016-11-19 NOTE — Patient Instructions (Addendum)
If you are age 60 or older, your body mass index should be between 23-30. Your Body mass index is 31.94 kg/m. If this is out of the aforementioned range listed, please consider follow up with your Primary Care Provider.  If you are age 54 or younger, your body mass index should be between 19-25. Your Body mass index is 31.94 kg/m. If this is out of the aformentioned range listed, please consider follow up with your Primary Care Provider.   You have been scheduled for an endoscopy and colonoscopy. Please follow the written instructions given to you at your visit today. Please pick up your prep supplies at the pharmacy within the next 1-3 days. If you use inhalers (even only as needed), please bring them with you on the day of your procedure. Your physician has requested that you go to www.startemmi.com and enter the access code given to you at your visit today. This web site gives a general overview about your procedure. However, you should still follow specific instructions given to you by our office regarding your preparation for the procedure.  Your physician has requested that you go to the basement for the following lab work before leaving today: IGA TTG  You have been given IBgard samples with a coupon.  Thank you for choosing me and [facility].  Alonza Bogus, PA-C

## 2016-11-22 ENCOUNTER — Telehealth: Payer: Self-pay

## 2016-11-22 DIAGNOSIS — R109 Unspecified abdominal pain: Secondary | ICD-10-CM

## 2016-11-22 NOTE — Progress Notes (Signed)
Agree with assessment and plan as outlined.  

## 2016-11-23 ENCOUNTER — Other Ambulatory Visit: Payer: 59

## 2016-11-23 NOTE — Telephone Encounter (Signed)
Per the lab the order was not put in as future and the lab could only see the IGA.  I have placed the order in the system correctly and called the pt.  She will come in tomorrow for repeat lab.

## 2016-11-23 NOTE — Telephone Encounter (Signed)
Pt calling for lab results. Please advise. 

## 2016-11-23 NOTE — Telephone Encounter (Signed)
I already spoke to [employer] [employer].  They never collected the TTG so that is why she has not been contacted yet--I do not have all the results.  Patty said she will address it and contact the lab to see what happened, etc.  Thank you,  Jess

## 2016-11-24 ENCOUNTER — Other Ambulatory Visit: Payer: 59

## 2016-11-24 DIAGNOSIS — R109 Unspecified abdominal pain: Secondary | ICD-10-CM

## 2016-11-25 LAB — TISSUE TRANSGLUTAMINASE, IGA: Tissue Transglutaminase Ab, IgA: 1 U/mL (ref <4)

## 2016-11-26 ENCOUNTER — Telehealth: Payer: Self-pay | Admitting: Gastroenterology

## 2016-11-26 ENCOUNTER — Encounter: Payer: Self-pay | Admitting: Gastroenterology

## 2016-11-26 NOTE — Telephone Encounter (Signed)
Message sent to Janett Billow, tried to call the pt and the line is a continuous busy signal

## 2016-11-26 NOTE — Telephone Encounter (Signed)
Hannah Short the pt is calling about her test results.  Have you seen the TTG?

## 2016-11-29 NOTE — Telephone Encounter (Signed)
Hannah Short the pt is calling for results, please advise

## 2016-11-29 NOTE — Telephone Encounter (Signed)
I spoke with the pt and advised that Janett Billow is off today but she will be called as soon as available.  Pt states that IBgard is helping.

## 2016-12-02 NOTE — Telephone Encounter (Signed)
See My Chart Message.

## 2016-12-15 ENCOUNTER — Encounter: Payer: Self-pay | Admitting: Internal Medicine

## 2016-12-28 ENCOUNTER — Telehealth: Payer: Self-pay | Admitting: Internal Medicine

## 2016-12-28 ENCOUNTER — Encounter: Payer: Self-pay | Admitting: Internal Medicine

## 2016-12-29 ENCOUNTER — Encounter: Payer: 59 | Admitting: Internal Medicine

## 2017-01-27 ENCOUNTER — Telehealth: Payer: Self-pay | Admitting: Internal Medicine

## 2017-01-27 NOTE — Telephone Encounter (Signed)
Peter Congo- Looks like Janett Billow saw patient. However, I am unsure why scheduled with Pyrtle... Looks like [facility] or Henrene Pastor was to be doing procedure per Jessica's note?

## 2017-01-27 NOTE — Telephone Encounter (Signed)
Per Janett Billow on 11/19/16 - schedule with Dr. Hilarie Fredrickson (written on flow sheet).  Jessica aware. She is going to talk with Dr Hilarie Fredrickson.  I called patient and told her that she does not need a pre-vist since scheduled on 11/19/16 and procedure is on 02/16/17.  Made her aware that she may be asked to sign another consent form at [facility] the morning of procedure.  New instructions with new dates and times verbalized with patient.  Patient made changes per my instructions on the instructions she has at home from initial visit.

## 2017-01-27 NOTE — Telephone Encounter (Signed)
Pt states that she has just put her mother in hospice and does not want to leave her if she doesn't have to. Pt states she already has her prep for colon on 7.18.18 and wants to know if she has to come in for pre visit on 7.5.18.

## 2017-02-15 ENCOUNTER — Telehealth: Payer: Self-pay | Admitting: Internal Medicine

## 2017-02-15 NOTE — Telephone Encounter (Signed)
Returned phone call to patient--no answer, message left that tetanus shot did not need to be completed in order to have procedure tomorrow. To call back if any other questions or concerns.

## 2017-02-16 ENCOUNTER — Encounter: Payer: Self-pay | Admitting: Internal Medicine

## 2017-02-16 ENCOUNTER — Encounter: Payer: 59 | Admitting: Internal Medicine

## 2017-02-16 ENCOUNTER — Ambulatory Visit (AMBULATORY_SURGERY_CENTER): Payer: 59 | Admitting: Internal Medicine

## 2017-02-16 VITALS — BP 138/74 | HR 66 | Temp 98.9°F | Resp 13 | Ht 64.5 in | Wt 189.0 lb

## 2017-02-16 DIAGNOSIS — R1084 Generalized abdominal pain: Secondary | ICD-10-CM | POA: Diagnosis present

## 2017-02-16 DIAGNOSIS — K5710 Diverticulosis of small intestine without perforation or abscess without bleeding: Secondary | ICD-10-CM | POA: Diagnosis not present

## 2017-02-16 DIAGNOSIS — R0789 Other chest pain: Secondary | ICD-10-CM | POA: Diagnosis not present

## 2017-02-16 DIAGNOSIS — K2100 Gastro-esophageal reflux disease with esophagitis, without bleeding: Secondary | ICD-10-CM

## 2017-02-16 DIAGNOSIS — K2950 Unspecified chronic gastritis without bleeding: Secondary | ICD-10-CM | POA: Diagnosis not present

## 2017-02-16 DIAGNOSIS — D123 Benign neoplasm of transverse colon: Secondary | ICD-10-CM

## 2017-02-16 MED ORDER — PANTOPRAZOLE SODIUM 40 MG PO TBEC: 40.0000 mg | DELAYED_RELEASE_TABLET | Freq: Every day | ORAL | 2 refills | Status: DC

## 2017-02-16 MED ORDER — SODIUM CHLORIDE 0.9 % IV SOLN: 500.0000 mL | INTRAVENOUS | Status: AC

## 2017-02-16 NOTE — Op Note (Signed)
[place] Patient Name: Hannah Short Procedure Date: 02/16/2017 2:05 PM MRN: [id number] Endoscopist: Jerene Bears , MD Age: 61 Referring MD:  Date of Birth: 13-Jul-1956 Gender: Female Account #: [id number] Procedure:                Upper GI endoscopy Indications:              Generalized abdominal pain, Chest pain (non cardiac) Medicines:                Monitored Anesthesia Care Procedure:                Pre-Anesthesia Assessment:                           - Prior to the procedure, a History and Physical                            was performed, and patient medications and                            allergies were reviewed. The patient's tolerance of                            previous anesthesia was also reviewed. The risks                            and benefits of the procedure and the sedation                            options and risks were discussed with the patient.                            All questions were answered, and informed consent                            was obtained. Prior Anticoagulants: The patient has                            taken no previous anticoagulant or antiplatelet                            agents. ASA Grade Assessment: II - A patient with                            mild systemic disease. After reviewing the risks                            and benefits, the patient was deemed in                            satisfactory condition to undergo the procedure.                           After obtaining informed consent, the endoscope was  passed under direct vision. Throughout the                            procedure, the patient's blood pressure, pulse, and                            oxygen saturations were monitored continuously. The                            Model GIF-HQ190 [id number]) scope was introduced                            through the mouth, and advanced to the second part                            of  duodenum. The upper GI endoscopy was                            accomplished without difficulty. The patient                            tolerated the procedure well. Scope In: Scope Out: Findings:                 LA Grade A (one or more mucosal breaks less than 5                            mm, not extending between tops of 2 mucosal folds)                            esophagitis with no bleeding was found at the                            gastroesophageal junction.                           A 3 cm hiatal hernia was found. The proximal extent                            of the gastric folds (end of tubular esophagus) was                            38 cm from the incisors. The hiatal narrowing was                            40 cm from the incisors.                           The exam of the esophagus was otherwise normal.                           The entire examined stomach was normal. Biopsies  were taken with a cold forceps for histology and                            Helicobacter pylori testing.                           The examined duodenum was normal. Complications:            No immediate complications. Estimated Blood Loss:     Estimated blood loss was minimal. Impression:               - LA Grade A reflux esophagitis.                           - 3 cm hiatal hernia.                           - Normal stomach. Biopsied.                           - Normal examined duodenum. Recommendation:           - Patient has a contact number available for                            emergencies. The signs and symptoms of potential                            delayed complications were discussed with the                            patient. Return to normal activities tomorrow.                            Written discharge instructions were provided to the                            patient.                           - Resume previous diet.                           -  Continue present medications.                           - Begin pantoprazole 40 mg daily, 30 minutes before                            breakfast daily for acid reflux with esophagitis.                           - See colonoscopy report. Jerene Bears, MD 02/16/2017 2:39:48 PM This report has been signed electronically.

## 2017-02-16 NOTE — Patient Instructions (Signed)
YOU HAD AN ENDOSCOPIC PROCEDURE TODAY AT [facility]:   Refer to the procedure report that was given to you for any specific questions about what was found during the examination.  If the procedure report does not answer your questions, please call your gastroenterologist to clarify.  If you requested that your care partner not be given the details of your procedure findings, then the procedure report has been included in a sealed envelope for you to review at your convenience later.  YOU SHOULD EXPECT: Some feelings of bloating in the abdomen. Passage of more gas than usual.  Walking can help get rid of the air that was put into your GI tract during the procedure and reduce the bloating. If you had a lower endoscopy (such as a colonoscopy or flexible sigmoidoscopy) you may notice spotting of blood in your stool or on the toilet paper. If you underwent a bowel prep for your procedure, you may not have a normal bowel movement for a few days.  Please Note:  You might notice some irritation and congestion in your nose or some drainage.  This is from the oxygen used during your procedure.  There is no need for concern and it should clear up in a day or so.  SYMPTOMS TO REPORT IMMEDIATELY:   Following lower endoscopy (colonoscopy or flexible sigmoidoscopy):  Excessive amounts of blood in the stool  Significant tenderness or worsening of abdominal pains  Swelling of the abdomen that is new, acute  Fever of 100F or higher   Following upper endoscopy (EGD)  Vomiting of blood or coffee ground material  New chest pain or pain under the shoulder blades  Painful or persistently difficult swallowing  New shortness of breath  Fever of 100F or higher  Black, tarry-looking stools  For urgent or emergent issues, a gastroenterologist can be reached at any hour by calling [phone].   DIET:  We do recommend a small meal at first, but then you may proceed to your regular diet.  Drink  plenty of fluids but you should avoid alcoholic beverages for 24 hours.  ACTIVITY:  You should plan to take it easy for the rest of today and you should NOT DRIVE or use heavy machinery until tomorrow (because of the sedation medicines used during the test).    FOLLOW UP: Our staff will call the number listed on your records the next business day following your procedure to check on you and address any questions or concerns that you may have regarding the information given to you following your procedure. If we do not reach you, we will leave a message.  However, if you are feeling well and you are not experiencing any problems, there is no need to return our call.  We will assume that you have returned to your regular daily activities without incident.  If any biopsies were taken you will be contacted by phone or by letter within the next 1-3 weeks.  Please call us at [phone] if you have not heard about the biopsies in 3 weeks.    SIGNATURES/CONFIDENTIALITY: You and/or your care partner have signed paperwork which will be entered into your electronic medical record.  These signatures attest to the fact that that the information above on your After Visit Summary has been reviewed and is understood.  Full responsibility of the confidentiality of this discharge information lies with you and/or your care-partner.     Handouts were given to your care partner on  GERD, hiatal hernia, polyps, diverticulosis, and hemorrhoids. Begin taking PANTOPRAZOLE 40 mg daily.  Take 30 minutes before breakfast for acid reflux with esophagitis. You may resume your other current medications today. Await biopsy results. Please call if any questions or concerns.

## 2017-02-16 NOTE — Progress Notes (Signed)
No problems noted in the recovery room. maw 

## 2017-02-16 NOTE — Op Note (Signed)
[place] Patient Name: Hannah Short Procedure Date: 02/16/2017 2:05 PM MRN: [id number] Endoscopist: Jerene Bears , MD Age: 61 Referring MD:  Date of Birth: 1956-03-15 Gender: Female Account #: [id number] Procedure:                Colonoscopy Indications:              Generalized abdominal pain, bloating Medicines:                Monitored Anesthesia Care Procedure:                Pre-Anesthesia Assessment:                           - Prior to the procedure, a History and Physical                            was performed, and patient medications and                            allergies were reviewed. The patient's tolerance of                            previous anesthesia was also reviewed. The risks                            and benefits of the procedure and the sedation                            options and risks were discussed with the patient.                            All questions were answered, and informed consent                            was obtained. Prior Anticoagulants: The patient has                            taken no previous anticoagulant or antiplatelet                            agents. ASA Grade Assessment: II - A patient with                            mild systemic disease. After reviewing the risks                            and benefits, the patient was deemed in                            satisfactory condition to undergo the procedure.                           After obtaining informed consent, the colonoscope  was passed under direct vision. Throughout the                            procedure, the patient's blood pressure, pulse, and                            oxygen saturations were monitored continuously. The                            Colonoscope was introduced through the anus and                            advanced to the the cecum, identified by                            appendiceal orifice and ileocecal  valve. The                            patient tolerated the procedure well. The quality                            of the bowel preparation was good. The ileocecal                            valve, appendiceal orifice, and rectum were                            photographed. Scope In: 2:19:19 PM Scope Out: 2:33:52 PM Scope Withdrawal Time: 0 hours 10 minutes 29 seconds  Total Procedure Duration: 0 hours 14 minutes 33 seconds  Findings:                 A 5 mm polyp was found in the transverse colon. The                            polyp was sessile. The polyp was removed with a                            cold snare. Resection and retrieval were complete.                           There was a medium-sized lipoma, [Diameter] in the                            ascending colon.                           Multiple small-mouthed diverticula were found in                            the sigmoid colon.                           Internal hemorrhoids were found during  retroflexion. The hemorrhoids were small.                           The exam was otherwise without abnormality. Complications:            No immediate complications. Estimated Blood Loss:     Estimated blood loss was minimal. Impression:               - One 5 mm polyp in the transverse colon, removed                            with a cold snare. Resected and retrieved.                           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                           - Diverticulosis in the sigmoid colon.                           - Internal hemorrhoids.                           - The examination was otherwise normal. Recommendation:           - Patient has a contact number available for                            emergencies. The signs and symptoms of potential                            delayed complications were discussed with the                            patient. Return to normal activities tomorrow.                             Written discharge instructions were provided to the                            patient.                           - Resume previous diet.                           - Continue present medications.                           - Await pathology results.                           - Repeat colonoscopy is recommended. The                            colonoscopy date will be determined after pathology  results from today's exam become available for                            review.                           - If pathology results unrevealing and persistent                            abdominal pain and bloating after trial of PPI,                            consider SIBO testing Jerene Bears, MD 02/16/2017 2:43:13 PM This report has been signed electronically.

## 2017-02-16 NOTE — Progress Notes (Signed)
Called to room to assist during endoscopic procedure.  Patient ID and intended procedure confirmed with present staff. Received instructions for my participation in the procedure from the performing physician.  

## 2017-02-16 NOTE — Progress Notes (Signed)
Spontaneous respirations throughout. VSS. Resting comfortably. To PACU on room air. Report to  Annette RN.  

## 2017-02-17 ENCOUNTER — Telehealth: Payer: Self-pay | Admitting: *Deleted

## 2017-02-17 ENCOUNTER — Telehealth: Payer: Self-pay

## 2017-02-17 NOTE — Telephone Encounter (Signed)
Called [phone] and the phone call did not go through.  Unable to leave a message. maw

## 2017-02-17 NOTE — Telephone Encounter (Signed)
  Follow up Call-  Call back number 02/16/2017  Post procedure Call Back phone  # [phone]  Permission to leave phone message Yes  Some recent data might be hidden     Patient questions:  Do you have a fever, pain , or abdominal swelling? No. Pain Score  0 *  Have you tolerated food without any problems? Yes.    Have you been able to return to your normal activities? Yes.    Do you have any questions about your discharge instructions: Diet   No. Medications  No. Follow up visit  No.  Do you have questions or concerns about your Care? No.  Actions: * If pain score is 4 or above: No action needed, pain <4.

## 2017-02-18 ENCOUNTER — Telehealth: Payer: Self-pay | Admitting: Internal Medicine

## 2017-02-18 NOTE — Telephone Encounter (Signed)
Patient had endocolon on 7/18, patient of Dr. Hilarie Fredrickson, routed message to DOD. Patient is having a lot of bloating/gas pains, is able to pass gas but not yet had a bm. She was given IBgard with peppermint before and this helped, she didn't know if this would be okay following her EGD results.

## 2017-02-20 NOTE — Telephone Encounter (Signed)
My apologies, I did not receive this message before the end of the day Friday. Common not to have a BM for a couple days after a colonoscopy.  Yes, those remedies are fine. Route to Pyrtle Monday if needed

## 2017-02-21 ENCOUNTER — Encounter: Payer: Self-pay | Admitting: Internal Medicine

## 2017-02-21 NOTE — Telephone Encounter (Signed)
Patient notified She is doing better, she has not had a BM yet.  She will try MOM if she does not have  BM today

## 2017-05-05 DIAGNOSIS — M545 Low back pain: Secondary | ICD-10-CM | POA: Diagnosis not present

## 2017-05-19 DIAGNOSIS — M2012 Hallux valgus (acquired), left foot: Secondary | ICD-10-CM | POA: Diagnosis not present

## 2017-05-19 DIAGNOSIS — G5790 Unspecified mononeuropathy of unspecified lower limb: Secondary | ICD-10-CM | POA: Diagnosis not present

## 2017-05-19 DIAGNOSIS — M2011 Hallux valgus (acquired), right foot: Secondary | ICD-10-CM | POA: Diagnosis not present

## 2017-05-19 DIAGNOSIS — M79671 Pain in right foot: Secondary | ICD-10-CM | POA: Diagnosis not present

## 2017-05-20 DIAGNOSIS — R202 Paresthesia of skin: Secondary | ICD-10-CM | POA: Diagnosis not present

## 2017-05-20 DIAGNOSIS — K219 Gastro-esophageal reflux disease without esophagitis: Secondary | ICD-10-CM | POA: Diagnosis not present

## 2017-05-20 DIAGNOSIS — M5432 Sciatica, left side: Secondary | ICD-10-CM | POA: Diagnosis not present

## 2017-05-20 DIAGNOSIS — Z6831 Body mass index (BMI) 31.0-31.9, adult: Secondary | ICD-10-CM | POA: Diagnosis not present

## 2017-05-20 DIAGNOSIS — M545 Low back pain: Secondary | ICD-10-CM | POA: Diagnosis not present

## 2017-05-20 DIAGNOSIS — M47816 Spondylosis without myelopathy or radiculopathy, lumbar region: Secondary | ICD-10-CM | POA: Diagnosis not present

## 2017-05-30 DIAGNOSIS — M5136 Other intervertebral disc degeneration, lumbar region: Secondary | ICD-10-CM | POA: Diagnosis not present

## 2017-06-14 DIAGNOSIS — M5136 Other intervertebral disc degeneration, lumbar region: Secondary | ICD-10-CM | POA: Diagnosis not present

## 2017-07-14 DIAGNOSIS — R079 Chest pain, unspecified: Secondary | ICD-10-CM | POA: Diagnosis not present

## 2017-07-14 DIAGNOSIS — I10 Essential (primary) hypertension: Secondary | ICD-10-CM | POA: Diagnosis not present

## 2017-07-14 DIAGNOSIS — K219 Gastro-esophageal reflux disease without esophagitis: Secondary | ICD-10-CM | POA: Diagnosis not present

## 2017-07-14 DIAGNOSIS — F411 Generalized anxiety disorder: Secondary | ICD-10-CM | POA: Diagnosis not present

## 2017-07-18 DIAGNOSIS — Z6832 Body mass index (BMI) 32.0-32.9, adult: Secondary | ICD-10-CM | POA: Diagnosis not present

## 2017-07-18 DIAGNOSIS — M25562 Pain in left knee: Secondary | ICD-10-CM | POA: Diagnosis not present

## 2017-07-18 DIAGNOSIS — Z0000 Encounter for general adult medical examination without abnormal findings: Secondary | ICD-10-CM | POA: Diagnosis not present

## 2017-08-02 DIAGNOSIS — S83207A Unspecified tear of unspecified meniscus, current injury, left knee, initial encounter: Secondary | ICD-10-CM

## 2017-08-02 HISTORY — DX: Unspecified tear of unspecified meniscus, current injury, left knee, initial encounter: S83.207A

## 2017-08-24 DIAGNOSIS — I10 Essential (primary) hypertension: Secondary | ICD-10-CM | POA: Diagnosis not present

## 2017-08-24 DIAGNOSIS — Z6831 Body mass index (BMI) 31.0-31.9, adult: Secondary | ICD-10-CM | POA: Diagnosis not present

## 2017-08-24 DIAGNOSIS — R0789 Other chest pain: Secondary | ICD-10-CM | POA: Diagnosis not present

## 2017-08-30 DIAGNOSIS — J189 Pneumonia, unspecified organism: Secondary | ICD-10-CM | POA: Diagnosis not present

## 2017-08-30 DIAGNOSIS — Z79899 Other long term (current) drug therapy: Secondary | ICD-10-CM | POA: Diagnosis not present

## 2017-08-30 DIAGNOSIS — E876 Hypokalemia: Secondary | ICD-10-CM | POA: Diagnosis not present

## 2017-08-30 DIAGNOSIS — I10 Essential (primary) hypertension: Secondary | ICD-10-CM | POA: Diagnosis not present

## 2017-08-30 DIAGNOSIS — R0789 Other chest pain: Secondary | ICD-10-CM | POA: Diagnosis not present

## 2017-08-30 DIAGNOSIS — Z881 Allergy status to other antibiotic agents status: Secondary | ICD-10-CM | POA: Diagnosis not present

## 2017-08-30 DIAGNOSIS — R079 Chest pain, unspecified: Secondary | ICD-10-CM | POA: Diagnosis not present

## 2017-08-30 DIAGNOSIS — Z23 Encounter for immunization: Secondary | ICD-10-CM | POA: Diagnosis not present

## 2017-08-30 DIAGNOSIS — R911 Solitary pulmonary nodule: Secondary | ICD-10-CM | POA: Diagnosis not present

## 2017-08-30 DIAGNOSIS — E119 Type 2 diabetes mellitus without complications: Secondary | ICD-10-CM | POA: Diagnosis not present

## 2017-08-30 DIAGNOSIS — Z91040 Latex allergy status: Secondary | ICD-10-CM | POA: Diagnosis not present

## 2017-08-30 DIAGNOSIS — Z888 Allergy status to other drugs, medicaments and biological substances status: Secondary | ICD-10-CM | POA: Diagnosis not present

## 2017-08-30 DIAGNOSIS — Z8249 Family history of ischemic heart disease and other diseases of the circulatory system: Secondary | ICD-10-CM | POA: Diagnosis not present

## 2017-08-30 DIAGNOSIS — J209 Acute bronchitis, unspecified: Secondary | ICD-10-CM | POA: Diagnosis not present

## 2017-08-31 DIAGNOSIS — J209 Acute bronchitis, unspecified: Secondary | ICD-10-CM | POA: Diagnosis not present

## 2017-08-31 DIAGNOSIS — E876 Hypokalemia: Secondary | ICD-10-CM | POA: Diagnosis not present

## 2017-08-31 DIAGNOSIS — Z23 Encounter for immunization: Secondary | ICD-10-CM | POA: Diagnosis not present

## 2017-08-31 DIAGNOSIS — I10 Essential (primary) hypertension: Secondary | ICD-10-CM | POA: Diagnosis not present

## 2017-08-31 DIAGNOSIS — Z79899 Other long term (current) drug therapy: Secondary | ICD-10-CM | POA: Diagnosis not present

## 2017-08-31 DIAGNOSIS — E119 Type 2 diabetes mellitus without complications: Secondary | ICD-10-CM | POA: Diagnosis not present

## 2017-08-31 DIAGNOSIS — Z881 Allergy status to other antibiotic agents status: Secondary | ICD-10-CM | POA: Diagnosis not present

## 2017-08-31 DIAGNOSIS — R911 Solitary pulmonary nodule: Secondary | ICD-10-CM | POA: Diagnosis not present

## 2017-08-31 DIAGNOSIS — Z8249 Family history of ischemic heart disease and other diseases of the circulatory system: Secondary | ICD-10-CM | POA: Diagnosis not present

## 2017-08-31 DIAGNOSIS — Z91040 Latex allergy status: Secondary | ICD-10-CM | POA: Diagnosis not present

## 2017-08-31 DIAGNOSIS — R0789 Other chest pain: Secondary | ICD-10-CM | POA: Diagnosis not present

## 2017-08-31 DIAGNOSIS — Z888 Allergy status to other drugs, medicaments and biological substances status: Secondary | ICD-10-CM | POA: Diagnosis not present

## 2017-08-31 DIAGNOSIS — R079 Chest pain, unspecified: Secondary | ICD-10-CM | POA: Diagnosis not present

## 2017-09-02 DIAGNOSIS — J209 Acute bronchitis, unspecified: Secondary | ICD-10-CM | POA: Diagnosis not present

## 2017-09-02 DIAGNOSIS — Z6831 Body mass index (BMI) 31.0-31.9, adult: Secondary | ICD-10-CM | POA: Diagnosis not present

## 2017-09-02 DIAGNOSIS — M940 Chondrocostal junction syndrome [Tietze]: Secondary | ICD-10-CM | POA: Diagnosis not present

## 2017-09-02 DIAGNOSIS — I10 Essential (primary) hypertension: Secondary | ICD-10-CM | POA: Diagnosis not present

## 2017-09-30 DIAGNOSIS — Z6831 Body mass index (BMI) 31.0-31.9, adult: Secondary | ICD-10-CM | POA: Diagnosis not present

## 2017-09-30 DIAGNOSIS — J209 Acute bronchitis, unspecified: Secondary | ICD-10-CM | POA: Diagnosis not present

## 2017-09-30 DIAGNOSIS — I10 Essential (primary) hypertension: Secondary | ICD-10-CM | POA: Diagnosis not present

## 2017-09-30 DIAGNOSIS — M940 Chondrocostal junction syndrome [Tietze]: Secondary | ICD-10-CM | POA: Diagnosis not present

## 2017-11-04 DIAGNOSIS — Z6831 Body mass index (BMI) 31.0-31.9, adult: Secondary | ICD-10-CM | POA: Diagnosis not present

## 2017-11-04 DIAGNOSIS — Z1231 Encounter for screening mammogram for malignant neoplasm of breast: Secondary | ICD-10-CM | POA: Diagnosis not present

## 2017-11-04 DIAGNOSIS — L219 Seborrheic dermatitis, unspecified: Secondary | ICD-10-CM | POA: Diagnosis not present

## 2017-11-07 DIAGNOSIS — M546 Pain in thoracic spine: Secondary | ICD-10-CM | POA: Diagnosis not present

## 2017-11-07 DIAGNOSIS — Z6831 Body mass index (BMI) 31.0-31.9, adult: Secondary | ICD-10-CM | POA: Diagnosis not present

## 2017-11-14 DIAGNOSIS — Z6831 Body mass index (BMI) 31.0-31.9, adult: Secondary | ICD-10-CM | POA: Diagnosis not present

## 2017-11-14 DIAGNOSIS — M546 Pain in thoracic spine: Secondary | ICD-10-CM | POA: Diagnosis not present

## 2017-11-14 DIAGNOSIS — M545 Low back pain: Secondary | ICD-10-CM | POA: Diagnosis not present

## 2018-01-04 DIAGNOSIS — Z01411 Encounter for gynecological examination (general) (routine) with abnormal findings: Secondary | ICD-10-CM | POA: Diagnosis not present

## 2018-01-04 DIAGNOSIS — R42 Dizziness and giddiness: Secondary | ICD-10-CM | POA: Diagnosis not present

## 2018-01-04 DIAGNOSIS — Z6831 Body mass index (BMI) 31.0-31.9, adult: Secondary | ICD-10-CM | POA: Diagnosis not present

## 2018-01-04 DIAGNOSIS — N950 Postmenopausal bleeding: Secondary | ICD-10-CM | POA: Diagnosis not present

## 2018-01-10 DIAGNOSIS — N950 Postmenopausal bleeding: Secondary | ICD-10-CM | POA: Diagnosis not present

## 2018-04-12 DIAGNOSIS — Z23 Encounter for immunization: Secondary | ICD-10-CM | POA: Diagnosis not present

## 2018-04-12 DIAGNOSIS — Z1159 Encounter for screening for other viral diseases: Secondary | ICD-10-CM | POA: Diagnosis not present

## 2018-05-12 DIAGNOSIS — Z23 Encounter for immunization: Secondary | ICD-10-CM | POA: Diagnosis not present

## 2018-05-26 DIAGNOSIS — Z6831 Body mass index (BMI) 31.0-31.9, adult: Secondary | ICD-10-CM | POA: Diagnosis not present

## 2018-05-26 DIAGNOSIS — R51 Headache: Secondary | ICD-10-CM | POA: Diagnosis not present

## 2018-05-26 DIAGNOSIS — R079 Chest pain, unspecified: Secondary | ICD-10-CM | POA: Diagnosis not present

## 2018-07-20 DIAGNOSIS — Z6831 Body mass index (BMI) 31.0-31.9, adult: Secondary | ICD-10-CM | POA: Diagnosis not present

## 2018-07-20 DIAGNOSIS — Z0000 Encounter for general adult medical examination without abnormal findings: Secondary | ICD-10-CM | POA: Diagnosis not present

## 2018-08-21 DIAGNOSIS — S83412A Sprain of medial collateral ligament of left knee, initial encounter: Secondary | ICD-10-CM | POA: Diagnosis not present

## 2018-08-21 DIAGNOSIS — Z6831 Body mass index (BMI) 31.0-31.9, adult: Secondary | ICD-10-CM | POA: Diagnosis not present

## 2018-08-21 DIAGNOSIS — S8392XA Sprain of unspecified site of left knee, initial encounter: Secondary | ICD-10-CM | POA: Diagnosis not present

## 2018-08-21 DIAGNOSIS — I10 Essential (primary) hypertension: Secondary | ICD-10-CM | POA: Diagnosis not present

## 2018-09-01 DIAGNOSIS — Z23 Encounter for immunization: Secondary | ICD-10-CM | POA: Diagnosis not present

## 2018-09-12 ENCOUNTER — Ambulatory Visit (INDEPENDENT_AMBULATORY_CARE_PROVIDER_SITE_OTHER): Payer: Self-pay

## 2018-09-12 ENCOUNTER — Ambulatory Visit (INDEPENDENT_AMBULATORY_CARE_PROVIDER_SITE_OTHER): Payer: BLUE CROSS/BLUE SHIELD | Admitting: Orthopedic Surgery

## 2018-09-12 VITALS — Ht 64.0 in | Wt 189.0 lb

## 2018-09-12 DIAGNOSIS — M5410 Radiculopathy, site unspecified: Secondary | ICD-10-CM | POA: Diagnosis not present

## 2018-09-12 DIAGNOSIS — M25562 Pain in left knee: Secondary | ICD-10-CM

## 2018-09-12 DIAGNOSIS — I87323 Chronic venous hypertension (idiopathic) with inflammation of bilateral lower extremity: Secondary | ICD-10-CM | POA: Diagnosis not present

## 2018-09-14 ENCOUNTER — Encounter (INDEPENDENT_AMBULATORY_CARE_PROVIDER_SITE_OTHER): Payer: Self-pay | Admitting: Orthopedic Surgery

## 2018-09-14 NOTE — Progress Notes (Addendum)
Office Visit Note   Patient: Hannah Short           Date of Birth: July 31, 1956           MRN: [id number] Visit Date: 09/12/2018              Requested by: Lanelle Bal, PA-C [place], [place] [address] PCP: Lanelle Bal, PA-C  Chief Complaint  Patient presents with  . Left Knee - Pain    S/p fall 08/04/2018      HPI: Patient is a 63 year old woman who presents after a fall on her left knee on January 3.  She complains of medial joint line pain and swelling states she does have swelling in both legs.  She has been undergoing previous epidural steroid's injections with Dr. Herma Mering.  Patient states that her pain is worse with going up and down stairs and she states she has decreased range of motion.  Assessment & Plan: Visit Diagnoses:  1. Acute pain of left knee   2. Idiopathic chronic venous hypertension of both lower extremities with inflammation   3. Radicular pain of left lower extremity     Plan: Recommended compression socks for both lower extremities.  Patient will increase her activities as tolerated for the knee she will follow-up with Dr. Herma Mering for any further epidural steroid injections.  Follow-Up Instructions: Return if symptoms worsen or fail to improve.   Ortho Exam  Patient is alert, oriented, no adenopathy, well-dressed, normal affect, normal respiratory effort. Examination patient has a normal gait.  She has a negative straight leg raise bilaterally with no focal motor weakness.  She has venous stasis swelling in both legs with a calf circumference of 46 cm worse on the left than the right.  Examination the left knee there is no effusion there is no redness there is no abrasions collaterals and cruciates are stable the joint line is nontender to palpation.  Imaging: No results found. No images are attached to the encounter.  Labs: No results found for: HGBA1C, ESRSEDRATE, CRP, LABURIC, REPTSTATUS, GRAMSTAIN, CULT, LABORGA   No results found for:  ALBUMIN, PREALBUMIN, LABURIC  Body mass index is 32.44 kg/m.  Orders:  Orders Placed This Encounter  Procedures  . XR Knee 1-2 Views Left   No orders of the defined types were placed in this encounter.    Procedures: No procedures performed  Clinical Data: No additional findings.  ROS:  All other systems negative, except as noted in the HPI. Review of Systems  Objective: Vital Signs: Ht 5\' 4"  (1.626 m)   Wt 189 lb (85.7 kg)   BMI 32.44 kg/m   Specialty Comments:  No specialty comments available.  PMFS History: Patient Active Problem List   Diagnosis Date Noted  . Abdominal pain 11/19/2016  . Change in bowel habits 11/19/2016  . Atypical chest pain 11/19/2016  . Bloating 11/19/2016  . Migraine   . Fibromyalgia    Past Medical History:  Diagnosis Date  . Anemia   . Anxiety   . Arthritis   . Fibromyalgia   . GERD (gastroesophageal reflux disease)   . Heart murmur    mitral valve prolapse  . Hypertension   . Migraine   . Neck pain     Family History  Problem Relation Age of Onset  . Heart failure Mother   . Heart failure Father     Past Surgical History:  Procedure Laterality Date  . CESAREAN SECTION    .  HERNIA REPAIR     Social History   Occupational History  . Occupation: employed  Tobacco Use  . Smoking status: Never Smoker  . Smokeless tobacco: Never Used  Substance and Sexual Activity  . Alcohol use: No  . Drug use: No  . Sexual activity: Not on file

## 2018-09-14 NOTE — Addendum Note (Signed)
Addended by: Meridee Score on: 09/14/2018 08:51 AM   Modules accepted: Level of Service

## 2018-12-01 DIAGNOSIS — S83412A Sprain of medial collateral ligament of left knee, initial encounter: Secondary | ICD-10-CM | POA: Diagnosis not present

## 2018-12-19 DIAGNOSIS — Z6831 Body mass index (BMI) 31.0-31.9, adult: Secondary | ICD-10-CM | POA: Diagnosis not present

## 2018-12-19 DIAGNOSIS — S83412A Sprain of medial collateral ligament of left knee, initial encounter: Secondary | ICD-10-CM | POA: Diagnosis not present

## 2018-12-19 DIAGNOSIS — M25569 Pain in unspecified knee: Secondary | ICD-10-CM | POA: Diagnosis not present

## 2018-12-19 DIAGNOSIS — I10 Essential (primary) hypertension: Secondary | ICD-10-CM | POA: Diagnosis not present

## 2018-12-26 DIAGNOSIS — R531 Weakness: Secondary | ICD-10-CM | POA: Diagnosis not present

## 2018-12-26 DIAGNOSIS — M25562 Pain in left knee: Secondary | ICD-10-CM | POA: Diagnosis not present

## 2018-12-28 DIAGNOSIS — R531 Weakness: Secondary | ICD-10-CM | POA: Diagnosis not present

## 2018-12-28 DIAGNOSIS — M25562 Pain in left knee: Secondary | ICD-10-CM | POA: Diagnosis not present

## 2019-01-04 DIAGNOSIS — M25562 Pain in left knee: Secondary | ICD-10-CM | POA: Diagnosis not present

## 2019-01-04 DIAGNOSIS — R531 Weakness: Secondary | ICD-10-CM | POA: Diagnosis not present

## 2019-01-09 DIAGNOSIS — M25562 Pain in left knee: Secondary | ICD-10-CM | POA: Diagnosis not present

## 2019-01-09 DIAGNOSIS — R531 Weakness: Secondary | ICD-10-CM | POA: Diagnosis not present

## 2019-01-11 DIAGNOSIS — M25562 Pain in left knee: Secondary | ICD-10-CM | POA: Diagnosis not present

## 2019-01-11 DIAGNOSIS — R531 Weakness: Secondary | ICD-10-CM | POA: Diagnosis not present

## 2019-01-23 DIAGNOSIS — M25562 Pain in left knee: Secondary | ICD-10-CM | POA: Diagnosis not present

## 2019-01-23 DIAGNOSIS — R531 Weakness: Secondary | ICD-10-CM | POA: Diagnosis not present

## 2019-01-25 DIAGNOSIS — M25562 Pain in left knee: Secondary | ICD-10-CM | POA: Diagnosis not present

## 2019-01-25 DIAGNOSIS — R531 Weakness: Secondary | ICD-10-CM | POA: Diagnosis not present

## 2019-01-30 DIAGNOSIS — R531 Weakness: Secondary | ICD-10-CM | POA: Diagnosis not present

## 2019-01-30 DIAGNOSIS — M25562 Pain in left knee: Secondary | ICD-10-CM | POA: Diagnosis not present

## 2019-02-13 DIAGNOSIS — R531 Weakness: Secondary | ICD-10-CM | POA: Diagnosis not present

## 2019-02-13 DIAGNOSIS — M25562 Pain in left knee: Secondary | ICD-10-CM | POA: Diagnosis not present

## 2019-02-20 DIAGNOSIS — R531 Weakness: Secondary | ICD-10-CM | POA: Diagnosis not present

## 2019-02-20 DIAGNOSIS — M25562 Pain in left knee: Secondary | ICD-10-CM | POA: Diagnosis not present

## 2019-02-22 DIAGNOSIS — R531 Weakness: Secondary | ICD-10-CM | POA: Diagnosis not present

## 2019-02-22 DIAGNOSIS — M25562 Pain in left knee: Secondary | ICD-10-CM | POA: Diagnosis not present

## 2019-02-28 DIAGNOSIS — S83412A Sprain of medial collateral ligament of left knee, initial encounter: Secondary | ICD-10-CM | POA: Diagnosis not present

## 2019-02-28 DIAGNOSIS — Z6831 Body mass index (BMI) 31.0-31.9, adult: Secondary | ICD-10-CM | POA: Diagnosis not present

## 2019-02-28 DIAGNOSIS — I10 Essential (primary) hypertension: Secondary | ICD-10-CM | POA: Diagnosis not present

## 2019-02-28 DIAGNOSIS — M25569 Pain in unspecified knee: Secondary | ICD-10-CM | POA: Diagnosis not present

## 2019-04-19 DIAGNOSIS — Z23 Encounter for immunization: Secondary | ICD-10-CM | POA: Diagnosis not present

## 2019-06-06 ENCOUNTER — Other Ambulatory Visit (HOSPITAL_COMMUNITY): Payer: Self-pay | Admitting: Family Medicine

## 2019-06-06 DIAGNOSIS — Z1231 Encounter for screening mammogram for malignant neoplasm of breast: Secondary | ICD-10-CM

## 2019-07-02 DIAGNOSIS — N760 Acute vaginitis: Secondary | ICD-10-CM | POA: Diagnosis not present

## 2019-07-02 DIAGNOSIS — N644 Mastodynia: Secondary | ICD-10-CM | POA: Diagnosis not present

## 2019-07-02 DIAGNOSIS — I10 Essential (primary) hypertension: Secondary | ICD-10-CM | POA: Diagnosis not present

## 2019-07-02 DIAGNOSIS — M25562 Pain in left knee: Secondary | ICD-10-CM | POA: Diagnosis not present

## 2019-07-05 DIAGNOSIS — M7122 Synovial cyst of popliteal space [Baker], left knee: Secondary | ICD-10-CM | POA: Diagnosis not present

## 2019-07-05 DIAGNOSIS — M25462 Effusion, left knee: Secondary | ICD-10-CM | POA: Diagnosis not present

## 2019-07-05 DIAGNOSIS — M25562 Pain in left knee: Secondary | ICD-10-CM | POA: Diagnosis not present

## 2019-07-16 ENCOUNTER — Encounter: Payer: Self-pay | Admitting: Orthopedic Surgery

## 2019-07-16 ENCOUNTER — Other Ambulatory Visit: Payer: Self-pay

## 2019-07-16 ENCOUNTER — Ambulatory Visit: Payer: BLUE CROSS/BLUE SHIELD | Admitting: Orthopedic Surgery

## 2019-07-16 VITALS — Ht 64.0 in | Wt 189.0 lb

## 2019-07-16 DIAGNOSIS — M1712 Unilateral primary osteoarthritis, left knee: Secondary | ICD-10-CM

## 2019-07-16 MED ORDER — LIDOCAINE HCL (PF) 1 % IJ SOLN
5.0000 mL | INTRAMUSCULAR | Status: AC | PRN
  Administered 2019-07-16: 5 mL

## 2019-07-16 MED ORDER — METHYLPREDNISOLONE ACETATE 40 MG/ML IJ SUSP
40.0000 mg | INTRAMUSCULAR | Status: AC | PRN
  Administered 2019-07-16: 12:00:00 40 mg via INTRA_ARTICULAR

## 2019-07-16 NOTE — Progress Notes (Signed)
Office Visit Note   Patient: Hannah Short           Date of Birth: [birth year]           MRN: [id number] Visit Date: 07/16/2019              Requested by: Hannah Bal, PA-C [place],  Hannah Short [id number] PCP: Hannah Bal, PA-C  Chief Complaint  Patient presents with  . Left Knee - Pain      HPI: Patient is a 63 year old woman is seen for initial evaluation for left knee pain she states she has had several falls recently directly on the left knee she complains of pain primarily over the medial joint line she has tried 2 different types of braces without relief she states she has had a steroid injection as well as oral prednisone with minimal relief.  Assessment & Plan: Visit Diagnoses:  1. Unilateral primary osteoarthritis, left knee     Plan: The left knee was injected she tolerated this well follow-up in 4 weeks.  Discussed that if we do not not getting sufficient relief with the injection arthroscopic debridement is an option.  Discussed that with arthroscopy her chance of improvement is about 50%.  Follow-Up Instructions: Return in about 4 weeks (around 08/13/2019).   Ortho Exam  Patient is alert, oriented, no adenopathy, well-dressed, normal affect, normal respiratory effort. Examination patient has an antalgic gait she does have some bruising medially over the left knee with swelling she is maximally tender to palpation of the medial joint line left knee collaterals and cruciates are stable.  Review of her MRI scan shows arthritic changes with no ligamentous or meniscal tear.  Imaging: No results found. No images are attached to the encounter.  Labs: No results found for: HGBA1C, ESRSEDRATE, CRP, LABURIC, REPTSTATUS, GRAMSTAIN, CULT, LABORGA   No results found for: ALBUMIN, PREALBUMIN, LABURIC  No results found for: MG No results found for: VD25OH  No results found for: PREALBUMIN No flowsheet data found.   Body mass index is 32.44 kg/m.  Orders:   Orders Placed This Encounter  Procedures  . Large Joint Inj: L knee   No orders of the defined types were placed in this encounter.    Procedures: Large Joint Inj: L knee on 07/16/2019 11:39 AM Indications: pain and diagnostic evaluation Details: 22 G 1.5 in needle, anteromedial approach  Arthrogram: No  Medications: 5 mL lidocaine (PF) 1 %; 40 mg methylPREDNISolone acetate 40 MG/ML Outcome: tolerated well, no immediate complications Procedure, treatment alternatives, risks and benefits explained, specific risks discussed. Consent was given by the patient. Immediately prior to procedure a time out was called to verify the correct patient, procedure, equipment, support staff and site/side marked as required. Patient was prepped and draped in the usual sterile fashion.      Clinical Data: No additional findings.  ROS:  All other systems negative, except as noted in the HPI. Review of Systems  Objective: Vital Signs: Ht 5\' 4"  (1.626 m)   Wt 189 lb (85.7 kg)   BMI 32.44 kg/m   Specialty Comments:  No specialty comments available.  PMFS History: Patient Active Problem List   Diagnosis Date Noted  . Abdominal pain 11/19/2016  . Change in bowel habits 11/19/2016  . Atypical chest pain 11/19/2016  . Bloating 11/19/2016  . Migraine   . Fibromyalgia    Past Medical History:  Diagnosis Date  . Anemia   . Anxiety   . Arthritis   .  Fibromyalgia   . GERD (gastroesophageal reflux disease)   . Heart murmur    mitral valve prolapse  . Hypertension   . Migraine   . Neck pain     Family History  Problem Relation Age of Onset  . Heart failure Mother   . Heart failure Father     Past Surgical History:  Procedure Laterality Date  . CESAREAN SECTION    . HERNIA REPAIR     Social History   Occupational History  . Occupation: employed  Tobacco Use  . Smoking status: Never Smoker  . Smokeless tobacco: Never Used  Substance and Sexual Activity  . Alcohol use: No   . Drug use: No  . Sexual activity: Not on file

## 2019-07-18 ENCOUNTER — Ambulatory Visit: Payer: BLUE CROSS/BLUE SHIELD | Admitting: Physician Assistant

## 2019-08-09 ENCOUNTER — Encounter: Payer: Self-pay | Admitting: Cardiovascular Disease

## 2019-08-09 DIAGNOSIS — E559 Vitamin D deficiency, unspecified: Secondary | ICD-10-CM | POA: Diagnosis not present

## 2019-08-09 DIAGNOSIS — Z6832 Body mass index (BMI) 32.0-32.9, adult: Secondary | ICD-10-CM | POA: Diagnosis not present

## 2019-08-09 DIAGNOSIS — Z0000 Encounter for general adult medical examination without abnormal findings: Secondary | ICD-10-CM | POA: Diagnosis not present

## 2019-08-13 ENCOUNTER — Encounter: Payer: Self-pay | Admitting: Orthopedic Surgery

## 2019-08-13 ENCOUNTER — Other Ambulatory Visit: Payer: Self-pay

## 2019-08-13 ENCOUNTER — Ambulatory Visit: Payer: BLUE CROSS/BLUE SHIELD | Admitting: Orthopedic Surgery

## 2019-08-13 VITALS — Ht 64.0 in | Wt 189.0 lb

## 2019-08-13 DIAGNOSIS — M25562 Pain in left knee: Secondary | ICD-10-CM

## 2019-08-13 MED ORDER — PREDNISONE 10 MG PO TABS: 10.0000 mg | ORAL_TABLET | Freq: Every day | ORAL | 0 refills | Status: DC

## 2019-08-13 NOTE — Progress Notes (Signed)
Office Visit Note   Patient: Hannah Short           Date of Birth: 12/31/1955           MRN: [id number] Visit Date: 08/13/2019              Requested by: Lanelle Bal, PA-C [place],  [place] [address] PCP: Lanelle Bal, PA-C  Chief Complaint  Patient presents with  . Left Knee - Follow-up    07/16/2019 Left Knee Cortisone Inj f/u      HPI: Patient presents in today for follow-up on her left knee injection she feels she had about a 75% relief in her symptoms.  She still complains of swelling about the knee into the top of the leg.  She denies any fever, chills, or calf pain  Assessment & Plan: Visit Diagnoses: No diagnosis found.  Plan: She states she had similar problems with this knee about a year ago.  She was given some prednisone at that time which significantly helped with the swelling I will give her a course of prednisone which she can wean off of if she feels better.  She will follow-up in 3 weeks. Follow-Up Instructions: No follow-ups on file.   Ortho Exam  Patient is alert, oriented, no adenopathy, well-dressed, normal affect, normal respiratory effort. Left knee.  No cellulitis no effusion some mild soft tissue swelling when compared to the right side.  Compartments are soft she has a negative Homans' sign  Imaging: No results found. No images are attached to the encounter.  Labs: No results found for: HGBA1C, ESRSEDRATE, CRP, LABURIC, REPTSTATUS, GRAMSTAIN, CULT, LABORGA   No results found for: ALBUMIN, PREALBUMIN, LABURIC  No results found for: MG No results found for: VD25OH  No results found for: PREALBUMIN No flowsheet data found.   Body mass index is 32.44 kg/m.  Orders:  No orders of the defined types were placed in this encounter.  Meds ordered this encounter  Medications  . predniSONE (DELTASONE) 10 MG tablet    Sig: Take 1 tablet (10 mg total) by mouth daily with breakfast.    Dispense:  30 tablet    Refill:  0     Procedures: No procedures performed  Clinical Data: No additional findings.  ROS:  All other systems negative, except as noted in the HPI. Review of Systems  Objective: Vital Signs: Ht 5\' 4"  (1.626 m)   Wt 189 lb (85.7 kg)   BMI 32.44 kg/m   Specialty Comments:  No specialty comments available.  PMFS History: Patient Active Problem List   Diagnosis Date Noted  . Abdominal pain 11/19/2016  . Change in bowel habits 11/19/2016  . Atypical chest pain 11/19/2016  . Bloating 11/19/2016  . Migraine   . Fibromyalgia    Past Medical History:  Diagnosis Date  . Anemia   . Anxiety   . Arthritis   . Fibromyalgia   . GERD (gastroesophageal reflux disease)   . Heart murmur    mitral valve prolapse  . Hypertension   . Migraine   . Neck pain     Family History  Problem Relation Age of Onset  . Heart failure Mother   . Heart failure Father     Past Surgical History:  Procedure Laterality Date  . CESAREAN SECTION    . HERNIA REPAIR     Social History   Occupational History  . Occupation: employed  Tobacco Use  . Smoking status: Never Smoker  .  Smokeless tobacco: Never Used  Substance and Sexual Activity  . Alcohol use: No  . Drug use: No  . Sexual activity: Not on file

## 2019-08-14 DIAGNOSIS — Z1231 Encounter for screening mammogram for malignant neoplasm of breast: Secondary | ICD-10-CM | POA: Diagnosis not present

## 2019-09-03 ENCOUNTER — Ambulatory Visit: Payer: Self-pay

## 2019-09-03 ENCOUNTER — Other Ambulatory Visit: Payer: Self-pay

## 2019-09-03 ENCOUNTER — Encounter: Payer: Self-pay | Admitting: Orthopedic Surgery

## 2019-09-03 ENCOUNTER — Ambulatory Visit: Payer: BLUE CROSS/BLUE SHIELD | Admitting: Orthopedic Surgery

## 2019-09-03 VITALS — Ht 64.0 in | Wt 189.0 lb

## 2019-09-03 DIAGNOSIS — G8929 Other chronic pain: Secondary | ICD-10-CM | POA: Diagnosis not present

## 2019-09-03 DIAGNOSIS — M5442 Lumbago with sciatica, left side: Secondary | ICD-10-CM | POA: Diagnosis not present

## 2019-09-03 MED ORDER — PREDNISONE 10 MG PO TABS: 20.0000 mg | ORAL_TABLET | Freq: Every day | ORAL | 0 refills | Status: DC

## 2019-09-03 NOTE — Progress Notes (Signed)
Office Visit Note   Patient: Hannah Short           Date of Birth: March 16, 1956           MRN: [id number] Visit Date: 09/03/2019              Requested by: Lanelle Bal, PA-C [place],  [place] [address] PCP: Lanelle Bal, PA-C  Chief Complaint  Patient presents with  . Left Knee - Pain  . 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  Patient states the prednisone has helped a little but patient states she has felt 4 pops in her back today which caused severe pain to go down her left thigh.  Patient states that she had good relief from epidural steroid injection with Dr. Nelva Bush 10 years ago and then probably 3 to 4 years ago she had injections that provided her no relief.   Assessment & Plan: Visit Diagnoses:  1. Chronic left-sided low back pain with left-sided sciatica     Plan: A refill prescription for prednisone was called in.  Patient states she has had no relief with using a back brace.  Recommended heat and we will obtain an MRI scan to further evaluate her lumbar spine for the possibility of epidural steroid injection.  Follow-Up Instructions: No follow-ups on file.   Ortho Exam  Patient is alert, oriented, no adenopathy, well-dressed, normal affect, normal respiratory effort. Examination patient has difficulty getting from a sitting to a standing position.  She has pain across her lumbar spine as well as radiating to the lateral aspect of her left thigh she has pain with a straight leg raise she has no focal motor weakness.  Imaging: No results found. No images are attached to the encounter.  Labs: No results found for: HGBA1C, ESRSEDRATE, CRP, LABURIC, REPTSTATUS, GRAMSTAIN, CULT, LABORGA   No results found for: ALBUMIN, PREALBUMIN, LABURIC  No results found for: MG No results found for: VD25OH  No results found for:  PREALBUMIN No flowsheet data found.   Body mass index is 32.44 kg/m.  Orders:  Orders Placed This Encounter  Procedures  . XR Lumbar Spine 2-3 Views   No orders of the defined types were placed in this encounter.    Procedures: No procedures performed  Clinical Data: No additional findings.  ROS:  All other systems negative, except as noted in the HPI. Review of Systems  Objective: Vital Signs: Ht 5\' 4"  (1.626 m)   Wt 189 lb (85.7 kg)   BMI 32.44 kg/m   Specialty Comments:  No specialty comments available.  PMFS History: Patient Active Problem List   Diagnosis Date Noted  . Abdominal pain 11/19/2016  . Change in bowel habits 11/19/2016  . Atypical chest pain 11/19/2016  . Bloating 11/19/2016  . Migraine   . Fibromyalgia    Past Medical History:  Diagnosis Date  . Anemia   . Anxiety   . Arthritis   . Fibromyalgia   . GERD (gastroesophageal reflux disease)   . Heart murmur    mitral valve prolapse  . Hypertension   . Migraine   . Neck pain     Family History  Problem Relation Age of Onset  . Heart failure Mother   . Heart failure Father     Past Surgical History:  Procedure Laterality Date  .  CESAREAN SECTION    . HERNIA REPAIR     Social History   Occupational History  . Occupation: employed  Tobacco Use  . Smoking status: Never Smoker  . Smokeless tobacco: Never Used  Substance and Sexual Activity  . Alcohol use: No  . Drug use: No  . Sexual activity: Not on file

## 2019-09-14 ENCOUNTER — Other Ambulatory Visit: Payer: Self-pay

## 2019-09-14 ENCOUNTER — Ambulatory Visit (HOSPITAL_COMMUNITY)
Admission: RE | Admit: 2019-09-14 | Discharge: 2019-09-14 | Disposition: A | Discharge status: Home / Self Care | Payer: BLUE CROSS/BLUE SHIELD | Source: Ambulatory Visit | Attending: Orthopedic Surgery | Admitting: Orthopedic Surgery

## 2019-09-14 DIAGNOSIS — G8929 Other chronic pain: Secondary | ICD-10-CM | POA: Diagnosis not present

## 2019-09-14 DIAGNOSIS — M5442 Lumbago with sciatica, left side: Secondary | ICD-10-CM | POA: Diagnosis not present

## 2019-09-14 IMAGING — MR MR LUMBAR SPINE W/O CM
4 of 5 series · 15 of 48 positions shown · non-contrast
Comparison: None.

CLINICAL DATA: Low back pain radiating into the left leg

EXAM:
MRI LUMBAR SPINE WITHOUT CONTRAST
TECHNIQUE: Multiplanar, multisequence MR imaging of the lumbar spine was
performed. No intravenous contrast was administered.

[Series 3: T2 · sagittal · 4.0mm · 0.75mm/px · 5 of 13 slices shown (1 of 2)]
[im 1/13]
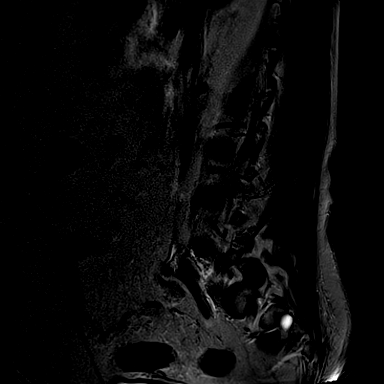
[im 4/13]
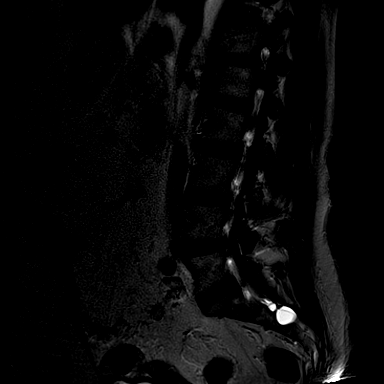
[im 7/13]
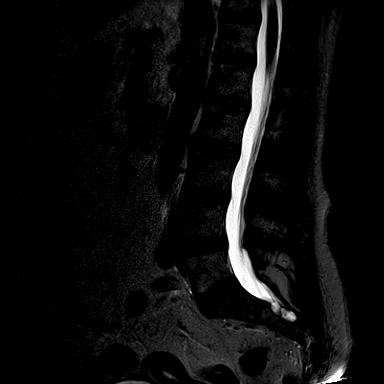
[im 10/13]
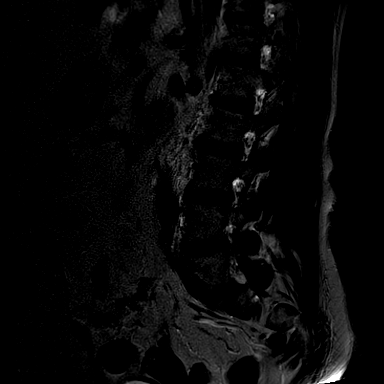
[im 13/13]
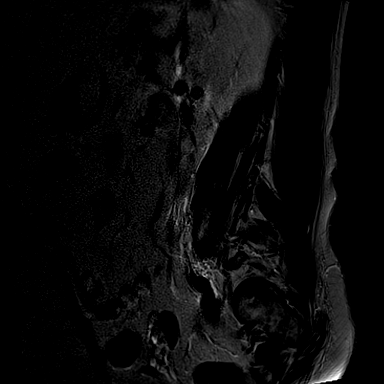

[Series 4: T1 · sagittal · 4.0mm · 0.37mm/px · 3 of 13 slices shown (1 of 2)]
[im 1/13]
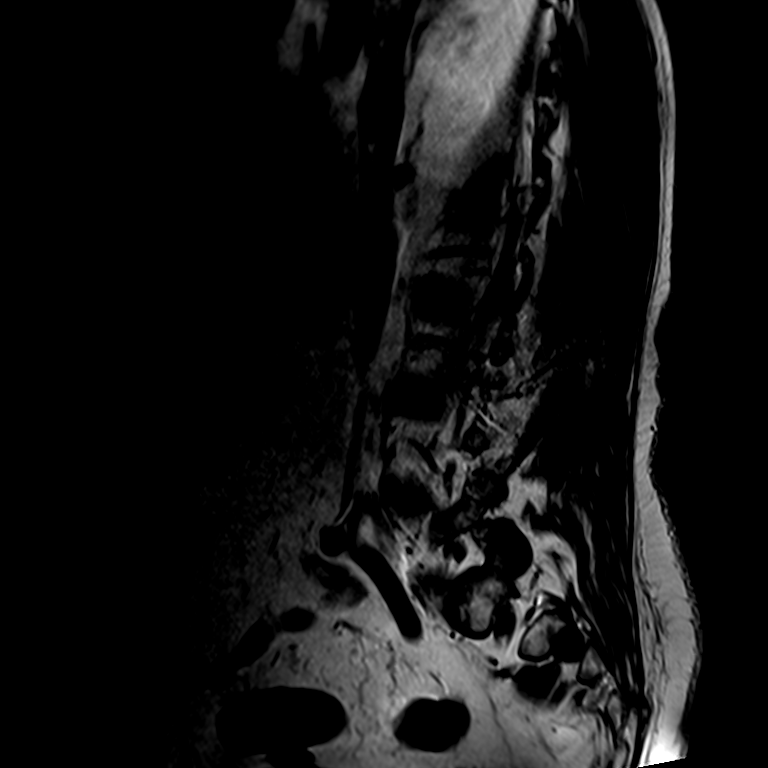
[im 7/13]
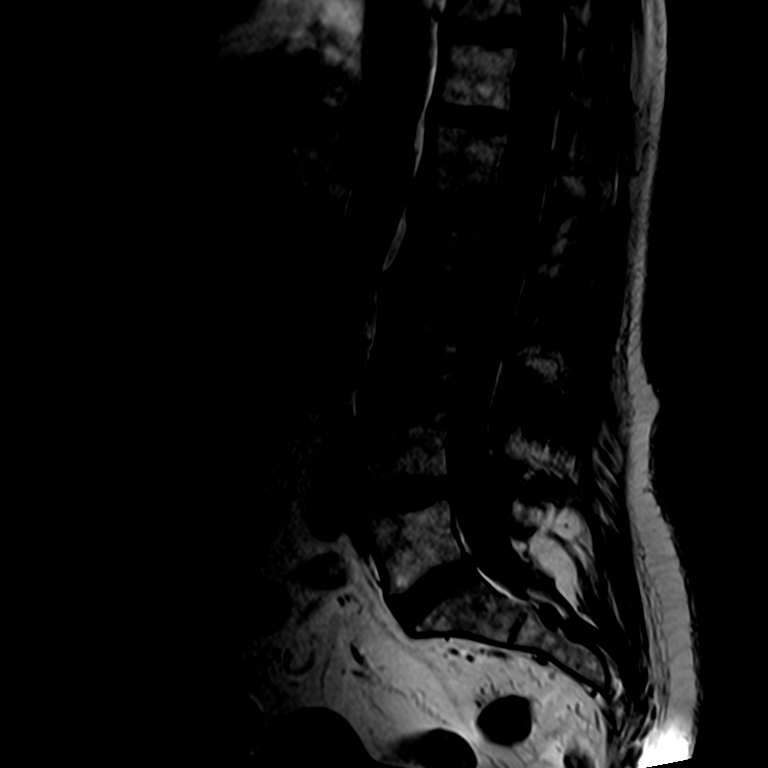
[im 13/13]
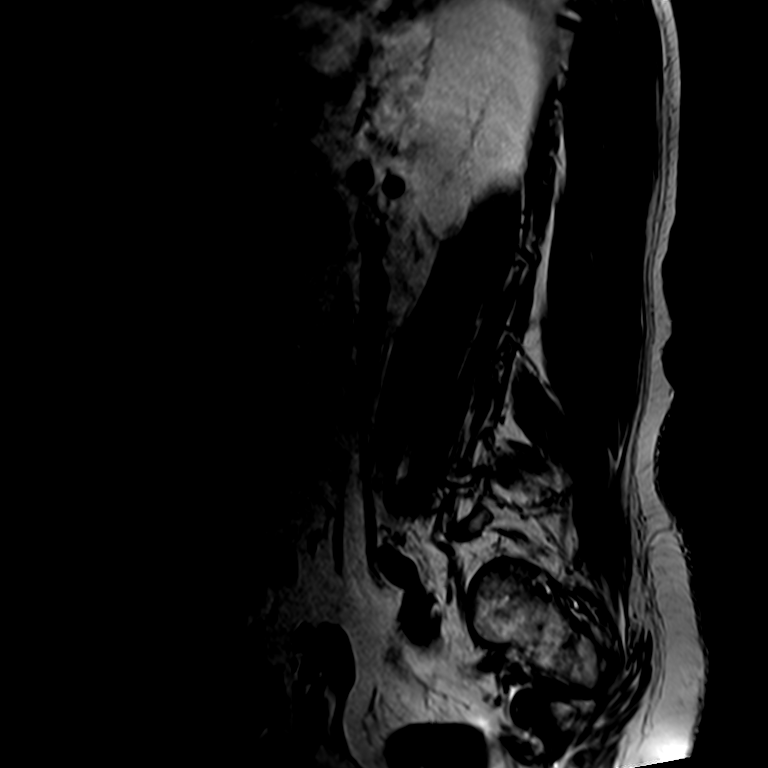

[Series 6: T2 · axial · 4.0mm · 0.29mm/px · z∈[-89,+111]mm · 4 of 50 slices shown (2 of 2)]
[im 4/50]
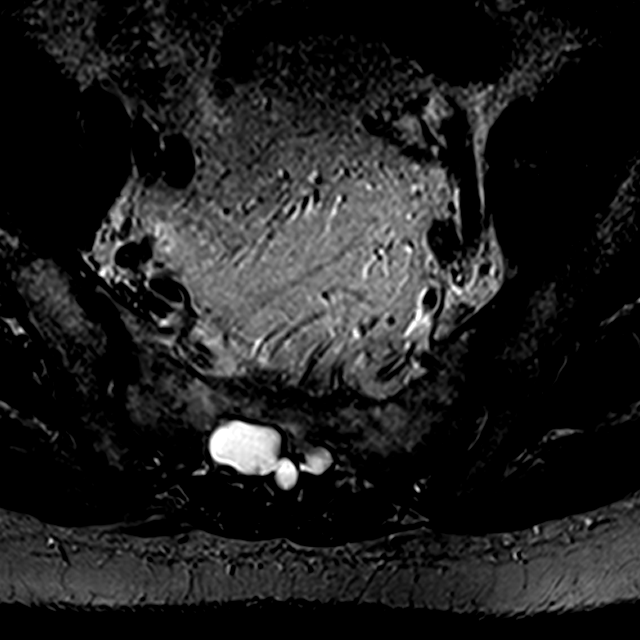
[im 7/50]
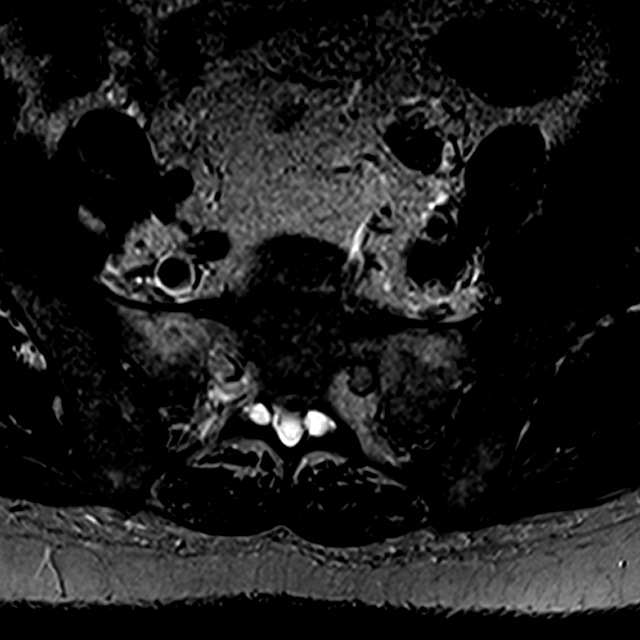
[im 25/50]
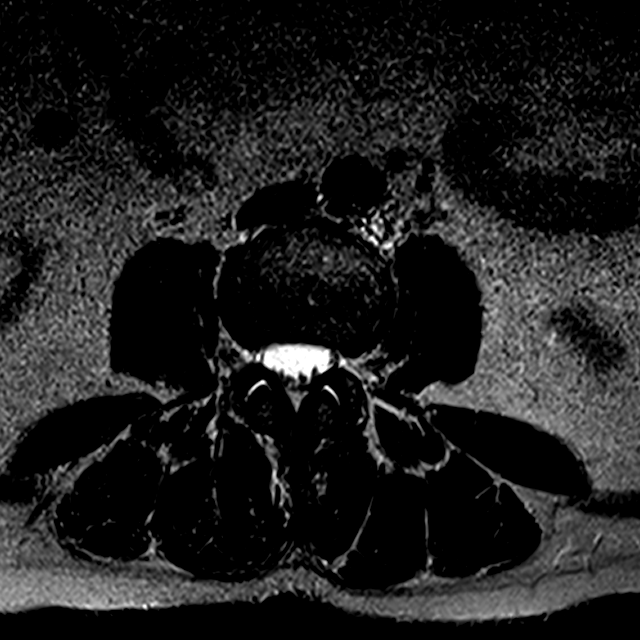
[im 43/50]
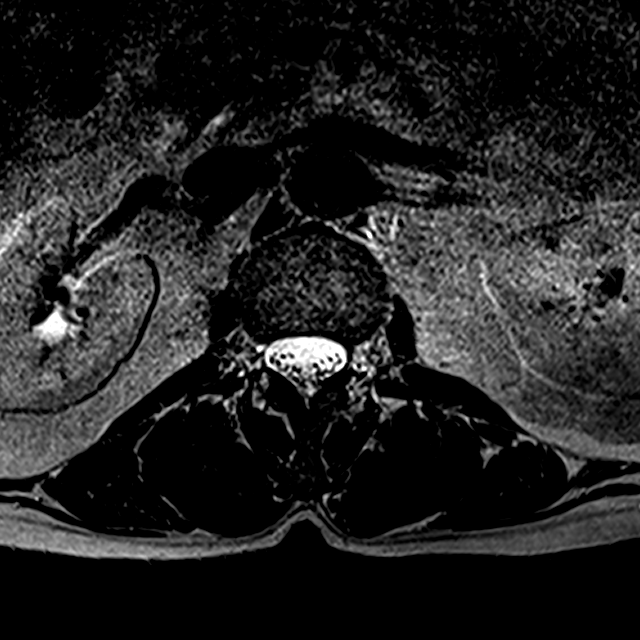

[Series 7: T1 · axial · 4.0mm · 0.29mm/px · z∈[-76,+111]mm · 3 of 50 slices shown (2 of 2)]
[im 7/50]
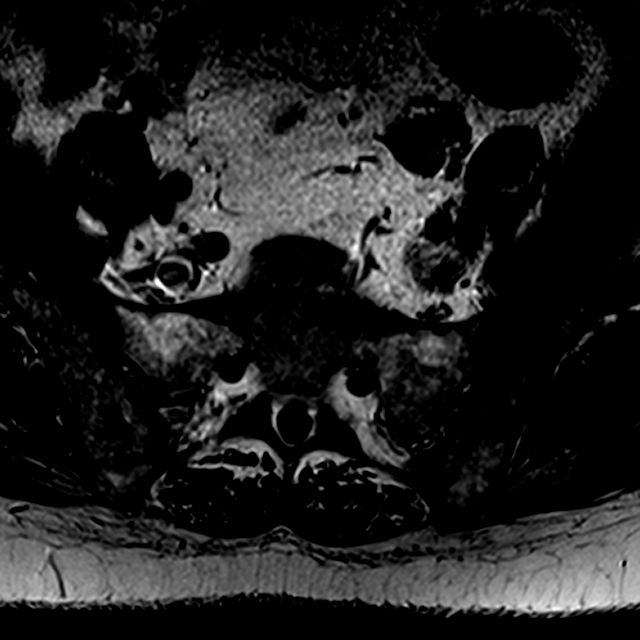
[im 25/50]
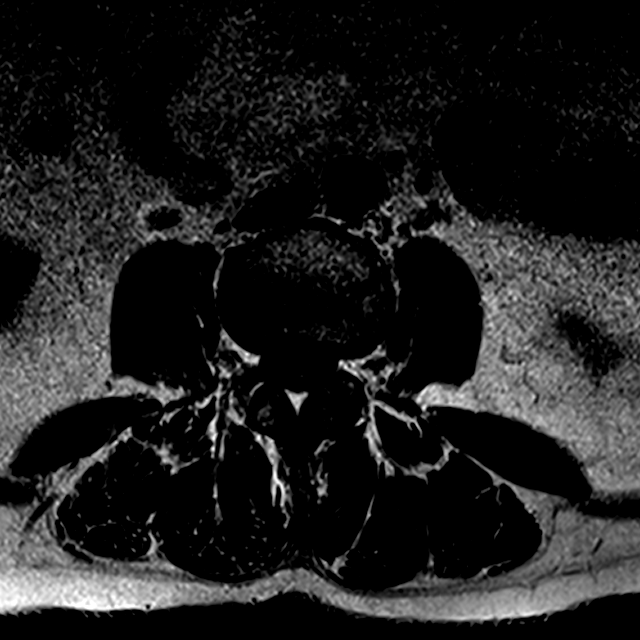
[im 43/50]
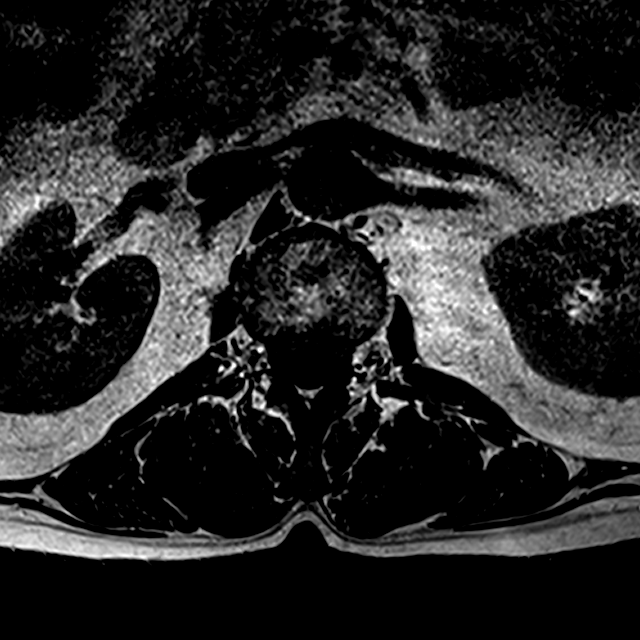

[15 of 48 positions shown; findings below may reference images not displayed]

FINDINGS: Segmentation:  Standard.

Alignment:  Physiologic.

Vertebrae:  No fracture, evidence of discitis, or bone lesion.

Conus medullaris and cauda equina: Conus extends to the L1 level.
Conus and cauda equina appear normal.

Paraspinal and other soft tissues: Negative.

Disc levels:

There is no disc herniation or spinal canal stenosis. Facets are
normal. No foraminal stenosis.
IMPRESSION: Normal lumbar spine.

## 2019-09-19 ENCOUNTER — Telehealth: Payer: Self-pay | Admitting: Orthopedic Surgery

## 2019-09-19 NOTE — Telephone Encounter (Signed)
I called pt and she wanted to make sure we had her MRI L spine because she picked up a CD and the radiologist report if needed. and advised that yes we have the report.She is aware that the imaging is WNL she wants to discuss next steps with Dr. Sharol Given. The pt did have any appointment for tomorrow but due to the weather we cancelled the appointment and I advised that I would hold this message and give a call back once Dr. Sharol Given has reviewed and with suggestions for next steps. Pt voiced understanding and is happy with plan.

## 2019-09-19 NOTE — Telephone Encounter (Signed)
Patient called and stated that she wanted to know if her images were in system so Sharol Given can see them before she comes to appt.  Please call patient to advise.[phone]

## 2019-09-20 ENCOUNTER — Ambulatory Visit (INDEPENDENT_AMBULATORY_CARE_PROVIDER_SITE_OTHER): Payer: BLUE CROSS/BLUE SHIELD | Admitting: Orthopedic Surgery

## 2019-09-20 DIAGNOSIS — G8929 Other chronic pain: Secondary | ICD-10-CM

## 2019-09-20 DIAGNOSIS — M5442 Lumbago with sciatica, left side: Secondary | ICD-10-CM

## 2019-09-24 ENCOUNTER — Encounter: Payer: Self-pay | Admitting: Orthopedic Surgery

## 2019-09-24 NOTE — Telephone Encounter (Signed)
Can you please review this pt's MRI l spine. It was a normal exam and she is wanting to know what the next steps are for treatment of the pain that she is having.

## 2019-09-24 NOTE — Progress Notes (Signed)
I called patient this morning and discussed her MRI findings that showed no herniated disc no stenosis.  Recommended that she continue her sports massage recommended continuing with the prednisone 10 mg with breakfast and recommended a walking program.  Patient states she wants to hold on physical therapy at this time due to the expense.

## 2019-09-28 ENCOUNTER — Encounter: Payer: Self-pay | Admitting: Cardiovascular Disease

## 2019-09-28 DIAGNOSIS — K219 Gastro-esophageal reflux disease without esophagitis: Secondary | ICD-10-CM | POA: Diagnosis not present

## 2019-09-28 DIAGNOSIS — R079 Chest pain, unspecified: Secondary | ICD-10-CM | POA: Diagnosis not present

## 2019-10-04 DIAGNOSIS — N281 Cyst of kidney, acquired: Secondary | ICD-10-CM | POA: Diagnosis not present

## 2019-10-04 DIAGNOSIS — R1084 Generalized abdominal pain: Secondary | ICD-10-CM | POA: Diagnosis not present

## 2019-10-04 DIAGNOSIS — K8080 Other cholelithiasis without obstruction: Secondary | ICD-10-CM | POA: Diagnosis not present

## 2019-10-31 ENCOUNTER — Telehealth: Payer: Self-pay | Admitting: Family Medicine

## 2019-10-31 DIAGNOSIS — K8020 Calculus of gallbladder without cholecystitis without obstruction: Secondary | ICD-10-CM | POA: Diagnosis not present

## 2019-10-31 NOTE — Telephone Encounter (Signed)
Patient called to set up appt - she believed she had seen Dr. Gwenlyn Found before. Looked through chart and did not see any OV with one of our cardiologists. Advised patient to have referral sent over.

## 2019-11-16 DIAGNOSIS — Z23 Encounter for immunization: Secondary | ICD-10-CM | POA: Diagnosis not present

## 2019-11-28 ENCOUNTER — Other Ambulatory Visit: Payer: Self-pay

## 2019-11-28 ENCOUNTER — Encounter: Payer: Self-pay | Admitting: Cardiovascular Disease

## 2019-11-28 ENCOUNTER — Ambulatory Visit (INDEPENDENT_AMBULATORY_CARE_PROVIDER_SITE_OTHER): Payer: BLUE CROSS/BLUE SHIELD | Admitting: Cardiovascular Disease

## 2019-11-28 DIAGNOSIS — R0600 Dyspnea, unspecified: Secondary | ICD-10-CM

## 2019-11-28 DIAGNOSIS — R0789 Other chest pain: Secondary | ICD-10-CM

## 2019-11-28 DIAGNOSIS — R0609 Other forms of dyspnea: Secondary | ICD-10-CM

## 2019-11-28 DIAGNOSIS — R0602 Shortness of breath: Secondary | ICD-10-CM | POA: Insufficient documentation

## 2019-11-28 NOTE — Progress Notes (Signed)
11/28/2019 Hannah Short   [birth year]  [id number]  Primary Physician Hannah Bal, PA-C Primary Cardiologist: Hannah Harp MD Hannah Short, [place]  HPI:  Hannah Short is a 64 y.o. moderately overweight married Caucasian female mother of 5 children, grandmother of 40 grandchildren and great grandmother of 3 grandchildren who is referred by Hannah Bal, PA-C, her PCP, for evaluation of atypical chest pain and dyspnea.  I have taken care of her pastor Hannah Short, her mother Hannah Short and her brother Hannah Short.  She works as a [name] and takes care of her daughter who has spina bifida.  She has no cardiac risk factors.  She is never had a heart attack or stroke.  She has had a Nissen fundoplication 24 years ago for hiatal hernia.  She also has GERD and is on a PPI which somewhat improves her chest pain.  She is also noticed increasing dyspnea over the last month.   Current Meds  Medication Sig  . cetirizine (ZYRTEC) 10 MG tablet Take 10 mg by mouth as needed for allergies.  Marland Kitchen omeprazole (PRILOSEC) 40 MG capsule Take 40 mg by mouth daily.     Allergies  Allergen Reactions  . Topamax [Topiramate] Other (See Comments)    Pt.states makes legs go numb  . Betanidine [Bethanidine]   . Flagyl [Metronidazole]   . Sulfa Antibiotics     Social History   Socioeconomic History  . Marital status: Married    Spouse name: Hannah Short  . Number of children: 5  . Years of education: 14  . Highest education level: Not on file  Occupational History  . Occupation: employed  Tobacco Use  . Smoking status: Never Smoker  . Smokeless tobacco: Never Used  Substance and Sexual Activity  . Alcohol use: No  . Drug use: No  . Sexual activity: Not on file  Other Topics Concern  . Not on file  Social History Narrative   Patient lives at home with her husband Hannah Short) and father in law and Hannah Short her daughter.   Patient is taking care of her father full time and disabled  daughter.   Education some college.    Right handed.   Caffeine one cup of coffee daily.   Pt states she is increasing her water intake.    Drinks decaf tea.       Social Determinants of Health   Financial Resource Strain:   . Difficulty of Paying Living Expenses:   Food Insecurity:   . Worried About Charity fundraiser in the Last Year:   . Arboriculturist in the Last Year:   Transportation Needs:   . Film/video editor (Medical):   Marland Kitchen Lack of Transportation (Non-Medical):   Physical Activity:   . Days of Exercise per Week:   . Minutes of Exercise per Session:   Stress:   . Feeling of Stress :   Social Connections:   . Frequency of Communication with Friends and Family:   . Frequency of Social Gatherings with Friends and Family:   . Attends Religious Services:   . Active Member of Clubs or Organizations:   . Attends Archivist Meetings:   Marland Kitchen Marital Status:   Intimate Partner Violence:   . Fear of Current or Ex-Partner:   . Emotionally Abused:   Marland Kitchen Physically Abused:   . Sexually Abused:      Review of Systems: General: negative for chills, fever, night sweats or  weight changes.  Cardiovascular: negative for chest pain, dyspnea on exertion, edema, orthopnea, palpitations, paroxysmal nocturnal dyspnea or shortness of breath Dermatological: negative for rash Respiratory: negative for cough or wheezing Urologic: negative for hematuria Abdominal: negative for nausea, vomiting, diarrhea, bright red blood per rectum, melena, or hematemesis Neurologic: negative for visual changes, syncope, or dizziness All other systems reviewed and are otherwise negative except as noted above.    Blood pressure 132/81, pulse 63, height 5\' 4"  (1.626 m), weight 194 lb (88 kg), SpO2 97 %.  General appearance: alert and no distress Neck: no adenopathy, no carotid bruit, no JVD, supple, symmetrical, trachea midline and thyroid not enlarged, symmetric, no  tenderness/mass/nodules Lungs: clear to auscultation bilaterally Heart: regular rate and rhythm, S1, S2 normal, no murmur, click, rub or gallop Extremities: extremities normal, atraumatic, no cyanosis or edema Pulses: 2+ and symmetric Skin: Skin color, texture, turgor normal. No rashes or lesions Neurologic: Alert and oriented X 3, normal strength and tone. Normal symmetric reflexes. Normal coordination and gait  EKG sinus rhythm at 63 with LVH, nonspecific ST and T wave changes, left axis deviation and lateral Q waves.  I personally reviewed this EKG.  ASSESSMENT AND PLAN:   Atypical chest pain [name]  is referred by her PCP for atypical chest pain.  She basically has no cardiac risk factors.  She has had a Nissen fundoplication for hiatal hernia in the past.  She had an episode of chest pain a year ago which resolved and again last month she has had recurrence of several episodes of chest pain.  These are worse after eating.  She basically has no risk factors.  I am going to get a coronary calcium score to risk stratify  Dyspnea on exertion More noticeable in the last month or so.  No history of tobacco abuse.  I am going to order a 2D echo to further evaluate      Hannah Harp MD [facility], [facility] 11/28/2019 11:41 AM

## 2019-11-28 NOTE — Assessment & Plan Note (Addendum)
More noticeable in the last month or so.  No history of tobacco abuse.  I am going to order a 2D echo to further evaluate

## 2019-11-28 NOTE — Patient Instructions (Signed)
Medication Instructions:  The current medical regimen is effective;  continue present plan and medications.  *If you need a refill on your cardiac medications before your next appointment, please call your pharmacy*   Testing/Procedures: Echocardiogram - Your physician has requested that you have an echocardiogram. Echocardiography is a painless test that uses sound waves to create images of your heart. It provides your doctor with information about the size and shape of your heart and how well your heart's chambers and valves are working. This procedure takes approximately one hour. There are no restrictions for this procedure. This will be performed at our [facility] location - [address].  CALCIUM SCORE   Follow-Up: At [facility], you and your health needs are our priority.  As part of our continuing mission to provide you with exceptional heart care, we have created designated Provider Care Teams.  These Care Teams include your primary Cardiologist (physician) and Advanced Practice Providers (APPs -  Physician Assistants and Nurse Practitioners) who all work together to provide you with the care you need, when you need it.  We recommend signing up for the patient portal called "MyChart".  Sign up information is provided on this After Visit Summary.  MyChart is used to connect with patients for Virtual Visits (Telemedicine).  Patients are able to view lab/test results, encounter notes, upcoming appointments, etc.  Non-urgent messages can be sent to your provider as well.   To learn more about what you can do with MyChart, go to [email or web address].    Your next appointment:   3 month(s)  The format for your next appointment:   In Person  Provider:   Quay Burow, MD

## 2019-11-28 NOTE — Assessment & Plan Note (Signed)
Hannah Short  is referred by her PCP for atypical chest pain.  She basically has no cardiac risk factors.  She has had a Nissen fundoplication for hiatal hernia in the past.  She had an episode of chest pain a year ago which resolved and again last month she has had recurrence of several episodes of chest pain.  These are worse after eating.  She basically has no risk factors.  I am going to get a coronary calcium score to risk stratify

## 2019-12-14 DIAGNOSIS — Z23 Encounter for immunization: Secondary | ICD-10-CM | POA: Diagnosis not present

## 2019-12-17 ENCOUNTER — Encounter: Payer: Self-pay | Admitting: Nutrition

## 2019-12-17 ENCOUNTER — Other Ambulatory Visit: Payer: Self-pay

## 2019-12-17 ENCOUNTER — Encounter: Payer: BLUE CROSS/BLUE SHIELD | Attending: Family Medicine | Admitting: Nutrition

## 2019-12-17 VITALS — Wt 194.0 lb

## 2019-12-17 NOTE — Progress Notes (Addendum)
  Medical Nutrition Therapy:  Appt start time: 1100 end time:  1200.   Assessment:  Primary concerns today: Overweight. PMH: Fatty liver, fibromyalgia, migraines, anxiety, GERD and anemia. Here with her daughter who has Spina bifida . She wants to lose weight. Eats 2 meals per day. Skips breakfast. Eats out quite a bit. Is very busy and eats on the road often.   Drinks a lot of sweet tea. Likes late night snacks. Eats most foods. Willing to make changes with her diet and exercise. Does walk some. Stays busy. Current diet is high in processed sugar, fat and salt and low in fresh fruits, vegetables and whole grains.    Preferred Learning Style:   Visual   Learning Readiness:   Ready  Change in progress   MEDICATIONS:    DIETARY INTAKE:   24-hr recall:  B ( AM): SKipped Snk ( AM):  L ( PM): [employer] chicken sandwich and sweet tea,  Snk ( PM):  D ( PM): [employer]  3 chicken legs, slaw, corn, sweet tea Snk ( PM): misc snacks,  Beverages: sweet tea,   Usual physical activity: ADL  Estimated energy needs: 1200 calories 135 g carbohydrates 90 g protein 33 g fat  Progress Towards Goal(s):  In progress.   Nutritional Diagnosis:  NI-1.5 Excessive energy intake As related to High calorie diet.  As evidenced by BMI of 33  and high calorie diet recall.    Intervention:  Nutrition and weight loss education provided on My Plate, CHO counting, meal planning, portion sizes, timing of meals, avoiding snacks between meals, taking medications as prescribed, benefits of exercising 30 minutes per day and prevention of complications of DM.  Goals Follow My Plate Eat meals on time Eat 30 g CHO at each meal Increase fresh fruits and vegetables. Don't skip meals Cut out sweet tea and only drink water. Walk 30 minutes or more daily. Lose 1 lb per week.   Teaching Method Utilized:  Visual Auditory Hands on  Handouts given during visit include:  The Plate Method   Meal Plan  Card  Weight loss tips     Barriers to learning/adherence to lifestyle change: none  Demonstrated degree of understanding via:  Teach Back   Monitoring/Evaluation:  Dietary intake, exercise,  and body weight in PRN. month(s).

## 2019-12-17 NOTE — Patient Instructions (Signed)
  Goals Follow My Plate Eat meals on time Eat 30 g CHO at each meal Increase fresh fruits and vegetables. Don't skip meals Cut out sweet tea and only drink water. Walk 30 minutes or more daily. Lose 1 lb per week.

## 2019-12-18 ENCOUNTER — Ambulatory Visit (HOSPITAL_COMMUNITY): Payer: BLUE CROSS/BLUE SHIELD | Attending: Cardiovascular Disease

## 2019-12-18 ENCOUNTER — Ambulatory Visit (INDEPENDENT_AMBULATORY_CARE_PROVIDER_SITE_OTHER)
Admission: RE | Admit: 2019-12-18 | Discharge: 2019-12-18 | Disposition: A | Discharge status: Home / Self Care | Payer: Self-pay | Source: Ambulatory Visit | Attending: Cardiovascular Disease | Admitting: Cardiovascular Disease

## 2019-12-18 DIAGNOSIS — R0609 Other forms of dyspnea: Secondary | ICD-10-CM

## 2019-12-18 DIAGNOSIS — R0789 Other chest pain: Secondary | ICD-10-CM | POA: Diagnosis not present

## 2019-12-18 DIAGNOSIS — R0600 Dyspnea, unspecified: Secondary | ICD-10-CM | POA: Diagnosis not present

## 2019-12-18 IMAGING — CT CT CARDIAC CORONARY ARTERY CALCIUM SCORE
3 series · 14 of 20 positions shown, 15 images · non-contrast
Comparison: Abdominal ultrasound [DATE]
COMPARISON: Abdominal ultrasound [DATE]

Addendum:
EXAM:
OVER-READ INTERPRETATION  CT CHEST

The following report is an over-read performed by radiologist Dr.
AUJLA [REDACTED] on [DATE]. This over-read
does not include interpretation of cardiac or coronary anatomy or
pathology. The coronary calcium score interpretation by the
cardiologist is attached.
CLINICAL DATA: Risk stratification
Coronary Calcium Score
TECHNIQUE: The patient was scanned on a Siemens Force scanner. Axial
non-contrast 3 mm slices were carried out through the heart. The
data set was analyzed on a dedicated work station and scored using
the Agatson method.

[Series 2: casc 3.0 bv41 2 bestdiast 69 % · axial · 0.34mm/px · z∈[-245,-164]mm · 4 of 47 slices shown, 5 images]
[im 10/47  vessel]
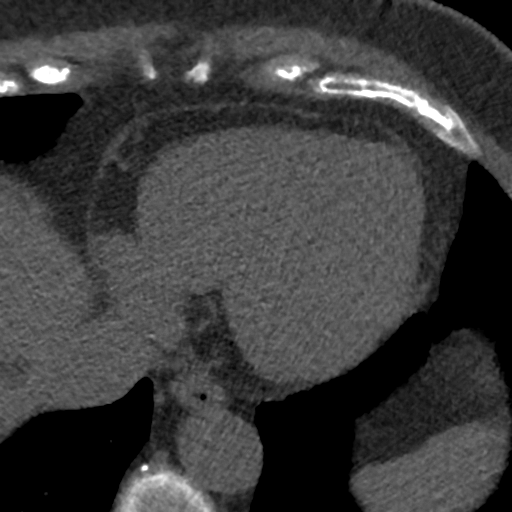
[im 10/47  lung]
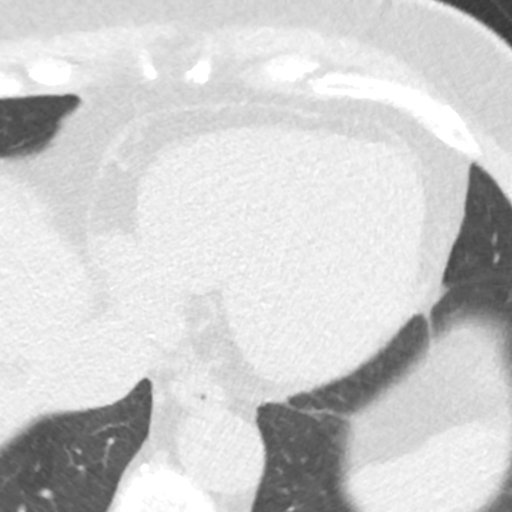
[im 19/47  vessel]
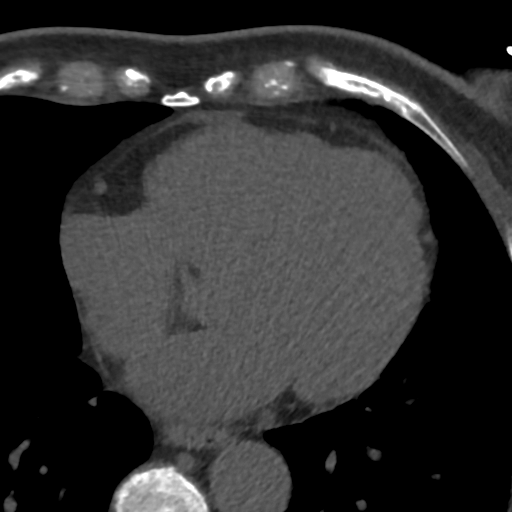
[im 28/47  vessel]
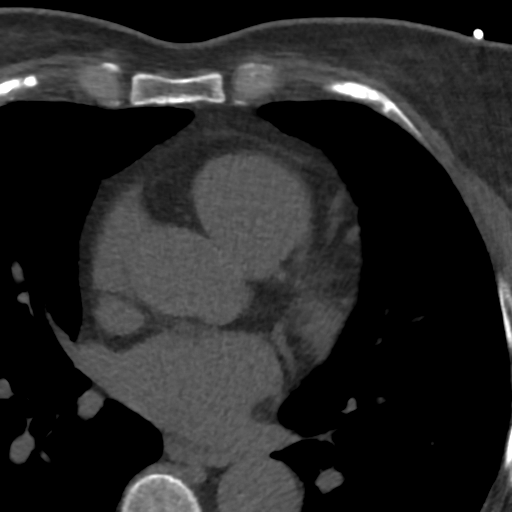
[im 37/47  vessel]
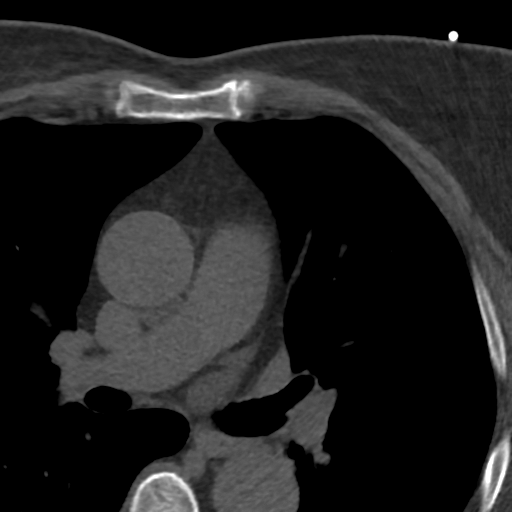

[Series 3: lung 69 % · axial · 0.68mm/px · z∈[-254,-158]mm · 5 of 48 slices shown]
[im 8/48  lung]
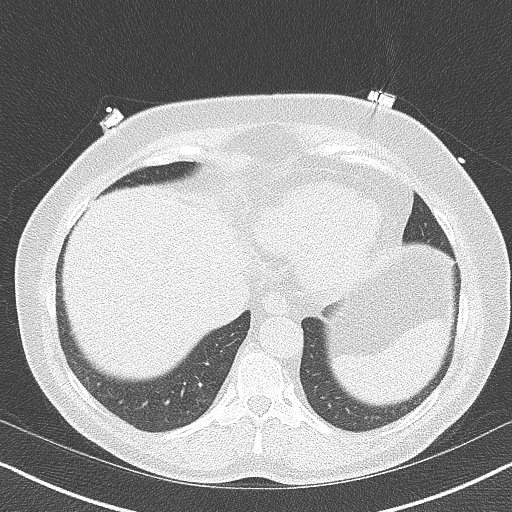
[im 16/48  lung]
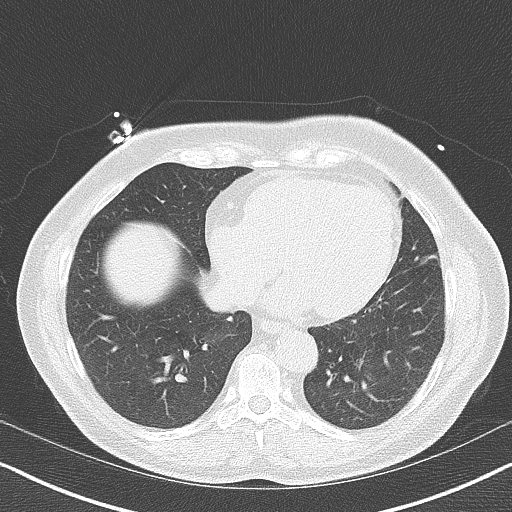
[im 24/48  lung]
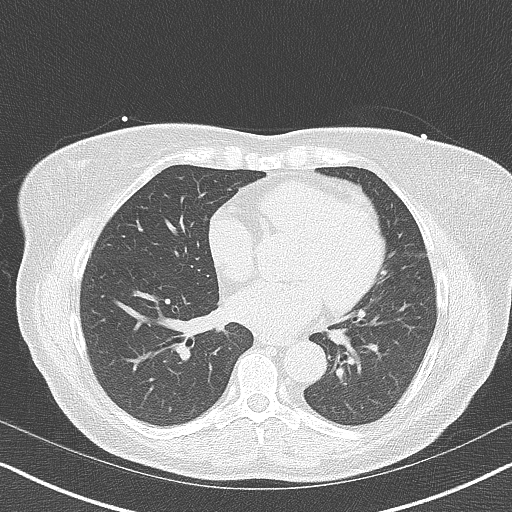
[im 32/48  lung]
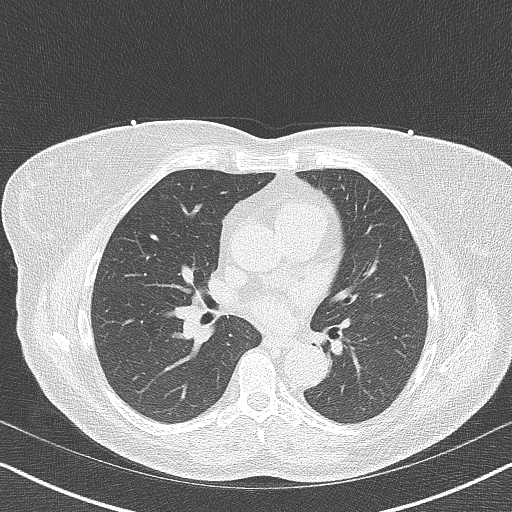
[im 40/48  lung]
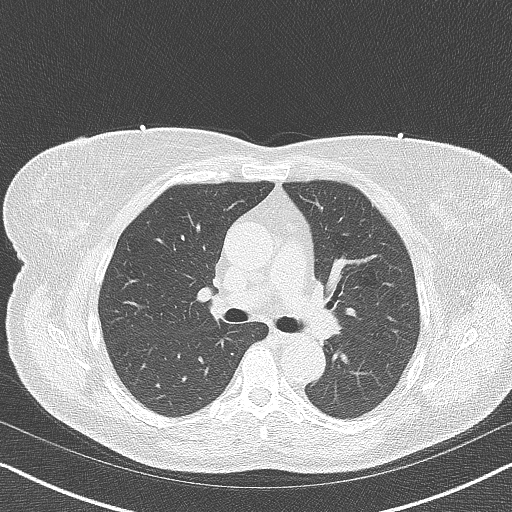

[Series 4: lung st 69 % · axial · 0.68mm/px · z∈[-254,-158]mm · 5 of 48 slices shown]
[im 8/48  lung]
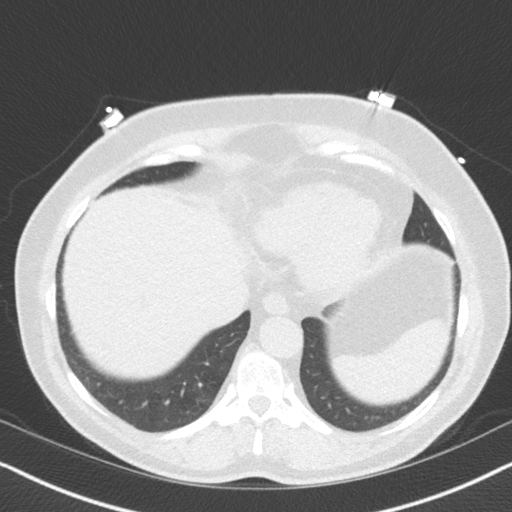
[im 16/48  lung]
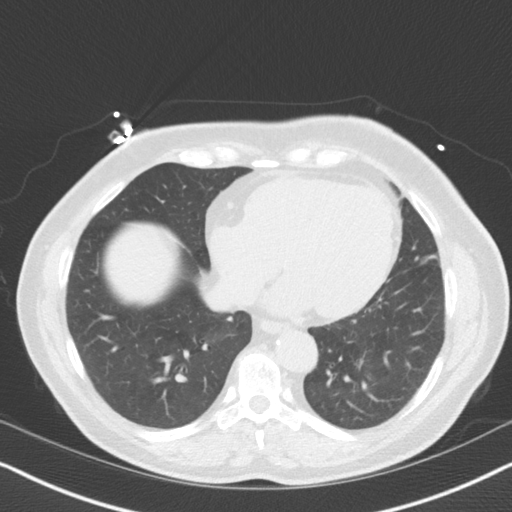
[im 24/48  lung]
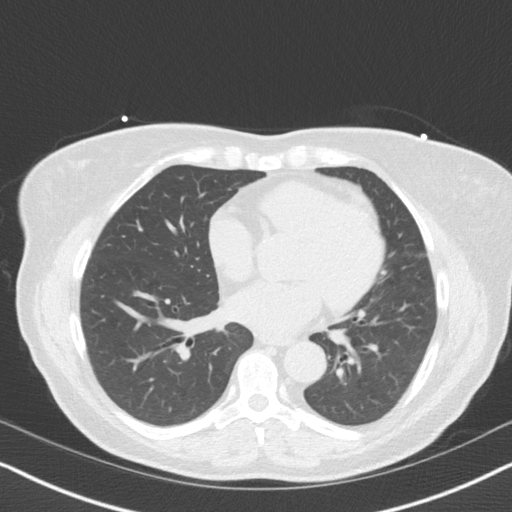
[im 32/48  lung]
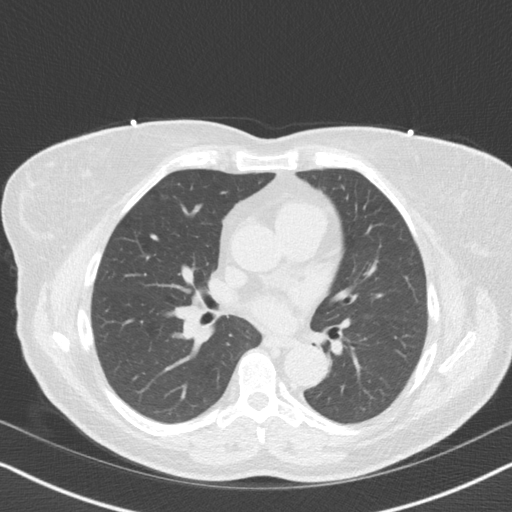
[im 40/48  lung]
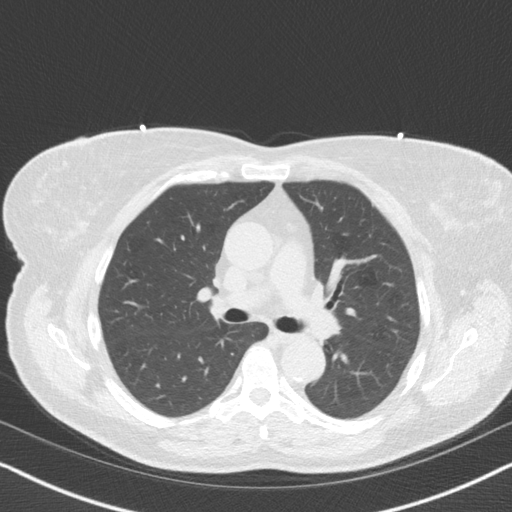

[14 of 20 positions shown; findings below may reference images not displayed]

FINDINGS: Vascular: Heart is normal size. Aorta normal caliber. Scattered
aortic calcifications.

Mediastinum/Nodes: No adenopathy

Lungs/Pleura: No confluent opacities or effusions. Small peripheral
nodule in the right lower lobe on image 13 measures 5 mm. Posterior
2-3 mm right lower lobe nodule on image 20. No effusions.

Upper Abdomen: Scattered hypodensities within the liver cannot be
characterized on this noncontrast study.

Musculoskeletal: Chest wall soft tissues are unremarkable. No acute
bony abnormality.
IMPRESSION: Small right lower lobe pulmonary nodules, the largest 5 mm. No
follow-up needed if patient is low-risk (and has no known or
suspected primary neoplasm). Non-contrast chest CT can be considered
in 12 months if patient is high-risk. This recommendation follows
the consensus statement: Guidelines for Management of Incidental
Pulmonary Nodules Detected on CT Images: From the [HOSPITAL]

Aortic atherosclerosis.

Scattered hypodensities in the liver cannot be characterized on this
noncontrast study but were seen on recent abdominal ultrasound and
were felt to represent cysts.
FINDINGS: Non-cardiac: See separate report from [REDACTED].

Ascending Aorta: Normal caliber.  Mild aortic root calcification.

Pericardium: Normal.

Coronary arteries: Normal origins.
IMPRESSION: Coronary calcium score of 0.

*** End of Addendum ***
EXAM:
OVER-READ INTERPRETATION  CT CHEST

The following report is an over-read performed by radiologist Dr.
AUJLA [REDACTED] on [DATE]. This over-read
does not include interpretation of cardiac or coronary anatomy or
pathology. The coronary calcium score interpretation by the
cardiologist is attached.
FINDINGS: Vascular: Heart is normal size. Aorta normal caliber. Scattered
aortic calcifications.

Mediastinum/Nodes: No adenopathy

Lungs/Pleura: No confluent opacities or effusions. Small peripheral
nodule in the right lower lobe on image 13 measures 5 mm. Posterior
2-3 mm right lower lobe nodule on image 20. No effusions.

Upper Abdomen: Scattered hypodensities within the liver cannot be
characterized on this noncontrast study.

Musculoskeletal: Chest wall soft tissues are unremarkable. No acute
bony abnormality.
IMPRESSION: Small right lower lobe pulmonary nodules, the largest 5 mm. No
follow-up needed if patient is low-risk (and has no known or
suspected primary neoplasm). Non-contrast chest CT can be considered
in 12 months if patient is high-risk. This recommendation follows
the consensus statement: Guidelines for Management of Incidental
Pulmonary Nodules Detected on CT Images: From the [HOSPITAL]

Aortic atherosclerosis.

Scattered hypodensities in the liver cannot be characterized on this
noncontrast study but were seen on recent abdominal ultrasound and
were felt to represent cysts.

## 2019-12-19 ENCOUNTER — Telehealth: Payer: Self-pay | Admitting: Cardiovascular Disease

## 2019-12-19 NOTE — Telephone Encounter (Signed)
CT CARDIAC SCORING (Accession [id number]) (Order [id number]) Imaging Date: 12/18/2019 Department: [facility] [place] Released By: Hannah Short Authorizing: Hannah Harp, MD  Exam Status  Status  Final [99]  PACS Intelerad Image Link  Show images for CT CARDIAC SCORING  Addendum  ADDENDUM REPORT: 12/18/2019 17:35  CLINICAL DATA:  Risk stratification  EXAM: Coronary Calcium Score  TECHNIQUE: The patient was scanned on a [employer] scanner. Axial non-contrast 3 mm slices were carried out through the heart. The data set was analyzed on a dedicated work station and scored using the [place].  FINDINGS: Non-cardiac: See separate report from [facility].  Ascending Aorta: Normal caliber.  Mild aortic root calcification.  Pericardium: Normal.  Coronary arteries: Normal origins.  IMPRESSION: Coronary calcium score of 0.  Hannah Chiquito, MD   Electronically Signed   By: Hannah Short   On: 12/18/2019 17:35   Addended by Hannah Rile, MD on 12/18/2019 5:38 PM    Study Result  EXAM: OVER-READ INTERPRETATION  CT CHEST  The following report is an over-read performed by radiologist Dr. Rolm Short of [facility], [place] on 12/18/2019. This over-read does not include interpretation of cardiac or coronary anatomy or pathology. The coronary calcium score interpretation by the cardiologist is attached.  COMPARISON:  Abdominal ultrasound 10/04/2018  FINDINGS: Vascular: Heart is normal size. Aorta normal caliber. Scattered aortic calcifications.  Mediastinum/Nodes: No adenopathy  Lungs/Pleura: No confluent opacities or effusions. Small peripheral nodule in the right lower lobe on image 13 measures 5 mm. Posterior 2-3 mm right lower lobe nodule on image 20. No effusions.  Upper Abdomen: Scattered hypodensities within the liver cannot be characterized on this noncontrast  study.  Musculoskeletal: Chest wall soft tissues are unremarkable. No acute bony abnormality.  IMPRESSION: Small right lower lobe pulmonary nodules, the largest 5 mm. No follow-up needed if patient is low-risk (and has no known or suspected primary neoplasm). Non-contrast chest CT can be considered in 12 months if patient is high-risk. This recommendation follows the consensus statement: Guidelines for Management of Incidental Pulmonary Nodules Detected on CT Images: From the Fleischner Society 2017; Radiology 2017; 284:228-243.  Aortic atherosclerosis.  Scattered hypodensities in the liver cannot be characterized on this noncontrast study but were seen on recent abdominal ultrasound and were felt to represent cysts.  Electronically Signed: By: Hannah Short [name] On: 12/18/2019 15:07     Result History  CT CARDIAC SCORING (Order [id number]) on 12/18/2019 - Order Result History Report - Result Edited  CT CARDIAC SCORING: Result Notes  Hannah Lay, RN  12/19/2019 3:12 PM EDT    Viewed in [place], result note released.    Hannah Harp, MD  12/18/2019 7:05 PM EDT    Cor CA Score of 0. No evid of CAD   Hannah Harp, MD  12/18/2019 5:31 PM EDT    No significant extracardiac findings on chest CT. There were small pulmonary nodules but patient does not appear to be at high risk      Encounter-Level Documents - 12/18/2019:  Electronic signature on 12/18/2019 12:31 PM - Hannah Short     Order-Level Documents:  There are no order-level documents. Vitals  Height Weight BMI (Calculated)  5\' 4"  (1.626 m) 194 lb (88 kg) 33.28  Protocol Documents  Imaging Protocol  Imaging  Imaging Information  CT CARDIAC SCORING: Comments to Patient   Dr. Gwenlyn Short reviewed your CT calcium score and it was 0!  No evidence of coronary artery  disease.  There were no significant findings on the chest CT. Small pulmonary nodules were noted but your are considered "low  risk", therefore no follow up is needed.  Please call if you have questions or concerns.  Written by Hannah Lay, RN on 12/19/2019 3:12 PM EDT Not seen Resulted by:  Signed Date/Time  Phone Pager  Hannah Short 12/18/2019 3:07 PM [phone]  Hannah Short 12/18/2019 5:35 PM [phone]   Link to IR Documentation Timeline  Sedation  CT CARDIAC SCORING: Result Notes  Hannah Lay, RN  12/19/2019 3:12 PM EDT    Viewed in [place], result note released.    Hannah Harp, MD  12/18/2019 7:05 PM EDT    Cor CA Score of 0. No evid of CAD   Hannah Harp, MD  12/18/2019 5:31 PM EDT    No significant extracardiac findings on chest CT. There were small pulmonary nodules but patient does not appear to be at high risk      Study Notes   Fonda Kinder on 12/18/2019 1:36 PM  Over read only. Family hx of CAD. DOE. Screening for calcium    Original Order  Ordered On Ordered By   11/28/2019 11:42 AM Hannah Beaver, LPN       External Result Report  External Result Report   [facility] ECHO COMPLETE WO IMAGING ENHANCING AGENT Order# [id number] Reading physician: Hannah Latch, MD Ordering physician: Hannah Harp, MD Study date: 12/18/19  ECHOCARDIOGRAM COMPLETE (Accession [id number]) (Order [id number]) Echocardiography Date: 12/18/2019 Department: [place] Released By: Hannah Short Authorizing: Hannah Harp, MD  ECHOCARDIOGRAM COMPLETE Order #: [id number] Accession #: [id number] Patient Info  Patient name: Hannah Short  MRN: [id number]  Age: 64 y.o.  Sex: female  Vitals  BP Height Weight BSA (Calculated - sq m)  132/81 5\' 4"  (1.626 m) 194 lb (88 kg) 1.99 sq meters  ECHOCARDIOGRAM COMPLETE: Result Notes  Hannah Lay, RN  12/19/2019 3:48 PM EDT    Viewed in [place], result note released.    Hannah Harp, MD  12/18/2019 7:06 PM EDT    Nl LV systolic FXN with [id number]       Order-Level Documents:  Scan on 12/18/2019 4:59 PM by Default, Provider, MD     Study Result    ECHOCARDIOGRAM REPORT       Patient Name:  Hannah Short Date of Exam: 12/18/2019  Medical Rec #: [id number]   Height:    64.0 in  Accession #:  [id number]   Weight:    194.0 lb  Date of Birth: 11/12/1955   BSA:     1.931 m  Patient Age:  64 years    BP:      122/81 mmHg  Patient Gender: F       HR:      61 bpm.  Exam Location: [place]   Procedure: 2D Echo, Cardiac Doppler and Color Doppler   Indications:  R06.00    History:    Patient has no prior history of Echocardiogram  examinations.         Signs/Symptoms:Shortness of Breath and Chest Pain.    Sonographer:  Coralyn Helling RDCS  Referring Phys: [place]    1. Left ventricular ejection fraction, by estimation, is 55 to 60%. The  left ventricle has normal function. The left ventricle has no regional  wall motion abnormalities. There is mild concentric  left ventricular  hypertrophy. Left ventricular diastolic  parameters are consistent with Grade I diastolic dysfunction (impaired  relaxation).  2. Right ventricular systolic function is normal. The right ventricular  size is normal. There is normal pulmonary artery systolic pressure.  3. Left atrial size was mildly dilated.  4. The mitral valve is normal in structure. Trivial mitral valve  regurgitation. No evidence of mitral stenosis.  5. The aortic valve is tricuspid. Aortic valve regurgitation is not  visualized. No aortic stenosis is present.  6. The inferior vena cava is normal in size with greater than 50%  respiratory variability, suggesting right atrial pressure of 3 mmHg.   FINDINGS  Left Ventricle: Left ventricular ejection fraction, by estimation, is 55  to 60%. The left ventricle has normal function. The left ventricle has no  regional wall motion  abnormalities. The left ventricular internal cavity  size was normal in size. There is  mild concentric left ventricular hypertrophy. Left ventricular diastolic  parameters are consistent with Grade I diastolic dysfunction (impaired  relaxation). Indeterminate filling pressures.   Right Ventricle: The right ventricular size is normal. No increase in  right ventricular wall thickness. Right ventricular systolic function is  normal. There is normal pulmonary artery systolic pressure. The tricuspid  regurgitant velocity is 2.38 m/s, and  with an assumed right atrial pressure of 3 mmHg, the estimated right  ventricular systolic pressure is [id number] mmHg.   Left Atrium: Left atrial size was mildly dilated.   Right Atrium: Right atrial size was normal in size.   Pericardium: There is no evidence of pericardial effusion.   Mitral Valve: The mitral valve is normal in structure. Normal mobility of  the mitral valve leaflets. Trivial mitral valve regurgitation. No evidence  of mitral valve stenosis.   Tricuspid Valve: The tricuspid valve is normal in structure. Tricuspid  valve regurgitation is trivial. No evidence of tricuspid stenosis.   Aortic Valve: The aortic valve is tricuspid. Aortic valve regurgitation is  not visualized. No aortic stenosis is present.   Pulmonic Valve: The pulmonic valve was normal in structure. Pulmonic valve  regurgitation is not visualized. No evidence of pulmonic stenosis.   Aorta: The aortic root is normal in size and structure.   Venous: The inferior vena cava is normal in size with greater than 50%  respiratory variability, suggesting right atrial pressure of 3 mmHg.   IAS/Shunts: No atrial level shunt detected by color flow Doppler.     LEFT VENTRICLE  PLAX 2D  LVIDd:     4.20 cm Diastology  LVIDs:     3.00 cm LV e' lateral:  4.79 cm/s  LV PW:     1.20 cm LV E/e' lateral: 15.5  LV IVS:    1.20 cm LV e' medial:  5.66 cm/s    LVOT diam:   2.00 cm LV E/e' medial: 13.1  LV SV:     78  LV SV Index:  40  LVOT Area:   3.14 cm     RIGHT VENTRICLE       IVC  RV S prime:   12.90 cm/s IVC diam: 1.80 cm  TAPSE (M-mode): 1.6 cm  RVSP:      25.7 mmHg   LEFT ATRIUM       Index    RIGHT ATRIUM      Index  LA diam:    4.00 cm 2.07 cm/m RA Pressure: 3.00 mmHg  LA Vol (A2C):  55.1 ml 28.53 ml/m RA Area:  9.80 cm  LA Vol (A4C):  55.1 ml 28.53 ml/m RA Volume:  18.90 ml 9.79 ml/m  LA Biplane Vol: 56.9 ml 29.46 ml/m  AORTIC VALVE  LVOT Vmax:  118.00 cm/s  LVOT Vmean: 82.300 cm/s  LVOT VTI:  0.247 m    AORTA  Ao Root diam: 3.20 cm  Ao Asc diam: 3.30 cm   MV E velocity: 74.10 cm/s TRICUSPID VALVE  MV A velocity: 92.40 cm/s TR Peak grad:  22.7 mmHg  MV E/A ratio: 0.80    TR Vmax:    238.00 cm/s               Estimated RAP: 3.00 mmHg               RVSP:      25.7 mmHg                 SHUNTS               Systemic VTI: 0.25 m               Systemic Diam: 2.00 cm   Hannah Latch MD  Electronically signed by Hannah Latch MD  Signature Date/Time: 12/18/2019/4:59:28 PM      Final   Imaging Info  ECHOCARDIOGRAM COMPLETE (Order [id number]) on 12/18/19  Study History  ECHOCARDIOGRAM COMPLETE (Order [id number]) on 12/18/19  [place] Images  Show images for ECHOCARDIOGRAM COMPLETE  Images on [employer]  Show images for Hannah Short  Performing Technologist/Nurse  Performing Technologist/Nurse: Elyse Jarvis  Reason for Exam Priority: Routine Dx: Dyspnea on exertion [R06.00 (ICD-10-CM)]; Atypical chest pain [R07.89 (ICD-10-CM)]                       Surgical History  Surgical History  No past medical history on file.    Other Surgical History  Procedure Laterality Date Comment Source  CESAREAN SECTION      HERNIA  REPAIR        Implants   No active implants to display in this view.  Encounter-Level Documents - 12/18/2019:  Electronic signature on 12/18/2019 12:34 PM - E-signed Electronic signature on 12/18/2019 12:33 PM - E-signed     Order-Level Documents - 12/18/2019:  Scan on 12/18/2019 4:59 PM by Default, Provider, MD     Resulted by:  Signed Date/Time  Phone Pager  [place], [place] 12/18/2019 4:59 PM [id number]   External Result Report  External Result Report     ECHOCARDIOGRAM COMPLETE: Comments to Patient   Dr. Gwenlyn Short reviewed your echocardiogram and noted the following:  --normal pumping function   of the heart muscle, heart muscle is strong! --mild stiffening of the heart muscle.    No changes recommended by Dr. Gwenlyn Short at this time. Please call or reach out via Mychart with questions or concerns.  Written by Hannah Lay, RN on 12/19/2019 3:48 PM EDT Not seen Pt aware of echo and cal score results./cy

## 2019-12-19 NOTE — Telephone Encounter (Signed)
Follow Up:    Pt would like to know if her Echo and CT results are ready please?

## 2020-01-22 ENCOUNTER — Encounter (INDEPENDENT_AMBULATORY_CARE_PROVIDER_SITE_OTHER): Payer: BLUE CROSS/BLUE SHIELD | Admitting: Ophthalmology

## 2020-01-22 ENCOUNTER — Other Ambulatory Visit: Payer: Self-pay

## 2020-01-22 DIAGNOSIS — D3131 Benign neoplasm of right choroid: Secondary | ICD-10-CM

## 2020-01-22 DIAGNOSIS — G43909 Migraine, unspecified, not intractable, without status migrainosus: Secondary | ICD-10-CM | POA: Diagnosis not present

## 2020-01-22 DIAGNOSIS — H43813 Vitreous degeneration, bilateral: Secondary | ICD-10-CM | POA: Diagnosis not present

## 2020-01-31 DIAGNOSIS — Z049 Encounter for examination and observation for unspecified reason: Secondary | ICD-10-CM | POA: Diagnosis not present

## 2020-01-31 DIAGNOSIS — G43719 Chronic migraine without aura, intractable, without status migrainosus: Secondary | ICD-10-CM | POA: Diagnosis not present

## 2020-01-31 DIAGNOSIS — Z79899 Other long term (current) drug therapy: Secondary | ICD-10-CM | POA: Diagnosis not present

## 2020-02-22 DIAGNOSIS — R030 Elevated blood-pressure reading, without diagnosis of hypertension: Secondary | ICD-10-CM | POA: Diagnosis not present

## 2020-02-22 DIAGNOSIS — M25569 Pain in unspecified knee: Secondary | ICD-10-CM | POA: Diagnosis not present

## 2020-02-22 DIAGNOSIS — R519 Headache, unspecified: Secondary | ICD-10-CM | POA: Diagnosis not present

## 2020-03-04 ENCOUNTER — Encounter: Payer: Self-pay | Admitting: Cardiovascular Disease

## 2020-03-04 ENCOUNTER — Other Ambulatory Visit: Payer: Self-pay

## 2020-03-04 ENCOUNTER — Ambulatory Visit (INDEPENDENT_AMBULATORY_CARE_PROVIDER_SITE_OTHER): Payer: BLUE CROSS/BLUE SHIELD | Admitting: Cardiovascular Disease

## 2020-03-04 VITALS — BP 150/96 | HR 81 | Ht 64.0 in | Wt 194.2 lb

## 2020-03-04 DIAGNOSIS — R634 Abnormal weight loss: Secondary | ICD-10-CM | POA: Diagnosis not present

## 2020-03-04 DIAGNOSIS — I10 Essential (primary) hypertension: Secondary | ICD-10-CM

## 2020-03-04 DIAGNOSIS — R0600 Dyspnea, unspecified: Secondary | ICD-10-CM | POA: Diagnosis not present

## 2020-03-04 DIAGNOSIS — R0609 Other forms of dyspnea: Secondary | ICD-10-CM

## 2020-03-04 DIAGNOSIS — R0789 Other chest pain: Secondary | ICD-10-CM | POA: Diagnosis not present

## 2020-03-04 MED ORDER — AMLODIPINE BESYLATE 5 MG PO TABS: 5.0000 mg | ORAL_TABLET | Freq: Every day | ORAL | 3 refills | Status: DC

## 2020-03-04 NOTE — Assessment & Plan Note (Signed)
BMI of 33.  Can refer her to Dr. Migdalia Dk office, nutrition and wellness, for diet and weight reduction.

## 2020-03-04 NOTE — Patient Instructions (Addendum)
Medication Instructions:   START Amlodipine 5 mg daily *If you need a refill on your cardiac medications before your next appointment, please call your pharmacy*  Lab Work: NONE ordered at this time of appointment   If you have labs (blood work) drawn today and your tests are completely normal, you will receive your results only by: Marland Kitchen MyChart Message (if you have MyChart) OR . A paper copy in the mail If you have any lab test that is abnormal or we need to change your treatment, we will call you to review the results.  Testing/Procedures: NONE ordered at this time of appointment   Follow-Up: At [facility], you and your health needs are our priority.  As part of our continuing mission to provide you with exceptional heart care, we have created designated Provider Care Teams.  These Care Teams include your primary Cardiologist (physician) and Advanced Practice Providers (APPs -  Physician Assistants and Nurse Practitioners) who all work together to provide you with the care you need, when you need it.   Your next appointment:   4 weeks  6 month(s)  The format for your next appointment:   In Person In Person  Provider:   Pharm D Quay Burow, MD  Other Instructions  Monitor blood pressure (BP) daily for 1 month. Keep a log and bring to follow up appointment with Pharm D  You have been referred to Dr. Leafy Ro

## 2020-03-04 NOTE — Assessment & Plan Note (Signed)
History of dyspnea exertion with recent 2D echo performed 12/18/2019 which was essentially normal.

## 2020-03-04 NOTE — Assessment & Plan Note (Signed)
Patient has been hypertensive lately.  She is kept her blood pressure log with systolic blood pressures in the one 50-1 80 range and diastolics in the 00-379 range.  She does say she watches her salt intake.  We will start her on amlodipine 5 mg a day and we will have her keep a blood pressure log for 4 weeks.  She will see a Pharm.D. back after that for review and further medication modification.

## 2020-03-04 NOTE — Progress Notes (Signed)
03/04/2020 [name]   November 13, 1955  [id number]  Primary Physician Lanelle Bal, PA-C Primary Cardiologist: Lorretta Harp MD [name], [place]  HPI:  Hannah Short is a 64 y.o.  moderately overweight married Caucasian female mother of 5 children, grandmother of 41 grandchildren and great grandmother of 3 grandchildren who is referred by Lanelle Bal, PA-C, her PCP, for evaluation of atypical chest pain and dyspnea.  I have taken care of her pastor [name], her mother [name] and her brother [name].  She works as a [name] and takes care of her daughter who has spina bifida.  I last saw her in the office 11/20/2019.  She has no cardiac risk factors.  She is never had a heart attack or stroke.  She has had a Nissen fundoplication 24 years ago for hiatal hernia.  She also has GERD and is on a PPI which somewhat improves her chest pain.  She is also noticed increasing dyspnea over the last month.  Since I saw her I ordered 2D echo for evaluation of dyspnea which is normal according to coronary calcium score as well which was zero.  Her major complaint now is of leg pain and back pain.  She is scheduled to see an orthopedic surgeon for evaluation of this.  At her request, I will refer her to Dr. Leafy Ro for nutrition evaluation and weight loss.    Current Meds  Medication Sig  . fexofenadine (ALLEGRA) 180 MG tablet Take 180 mg by mouth daily.  Marland Kitchen gabapentin (NEURONTIN) 300 MG capsule Take 300 mg by mouth in the morning and at bedtime.  Marland Kitchen omeprazole (PRILOSEC) 40 MG capsule Take 40 mg by mouth daily.     Allergies  Allergen Reactions  . Topamax [Topiramate] Other (See Comments)    Pt.states makes legs go numb  . Betanidine [Bethanidine]   . Flagyl [Metronidazole]   . Sulfa Antibiotics     Social History   Socioeconomic History  . Marital status: Married    Spouse name: [name]  . Number of children: 5  . Years of education: 77  . Highest education  level: Not on file  Occupational History  . Occupation: employed  Tobacco Use  . Smoking status: Never Smoker  . Smokeless tobacco: Never Used  Substance and Sexual Activity  . Alcohol use: No  . Drug use: No  . Sexual activity: Not on file  Other Topics Concern  . Not on file  Social History Narrative   Patient lives at home with her husband [name]) and father in law and [name] her daughter.   Patient is taking care of her father full time and disabled daughter.   Education some college.    Right handed.   Caffeine one cup of coffee daily.   Pt states she is increasing her water intake.    Drinks decaf tea.       Social Determinants of Health   Financial Resource Strain:   . Difficulty of Paying Living Expenses:   Food Insecurity:   . Worried About Charity fundraiser in the Last Year:   . Arboriculturist in the Last Year:   Transportation Needs:   . Film/video editor (Medical):   Marland Kitchen Lack of Transportation (Non-Medical):   Physical Activity:   . Days of Exercise per Week:   . Minutes of Exercise per Session:   Stress:   . Feeling of Stress :   Social  Connections:   . Frequency of Communication with Friends and Family:   . Frequency of Social Gatherings with Friends and Family:   . Attends Religious Services:   . Active Member of Clubs or Organizations:   . Attends Archivist Meetings:   Marland Kitchen Marital Status:   Intimate Partner Violence:   . Fear of Current or Ex-Partner:   . Emotionally Abused:   Marland Kitchen Physically Abused:   . Sexually Abused:      Review of Systems: General: negative for chills, fever, night sweats or weight changes.  Cardiovascular: negative for chest pain, dyspnea on exertion, edema, orthopnea, palpitations, paroxysmal nocturnal dyspnea or shortness of breath Dermatological: negative for rash Respiratory: negative for cough or wheezing Urologic: negative for hematuria Abdominal: negative for nausea, vomiting, diarrhea, bright red  blood per rectum, melena, or hematemesis Neurologic: negative for visual changes, syncope, or dizziness All other systems reviewed and are otherwise negative except as noted above.    Blood pressure (!) 150/96, pulse 81, height 5\' 4"  (1.626 m), weight 194 lb 3.2 oz (88.1 kg), SpO2 99 %.  General appearance: alert and no distress Neck: no adenopathy, no carotid bruit, no JVD, supple, symmetrical, trachea midline and thyroid not enlarged, symmetric, no tenderness/mass/nodules Lungs: clear to auscultation bilaterally Heart: regular rate and rhythm, S1, S2 normal, no murmur, click, rub or gallop Extremities: extremities normal, atraumatic, no cyanosis or edema Pulses: 2+ and symmetric Skin: Skin color, texture, turgor normal. No rashes or lesions Neurologic: Alert and oriented X 3, normal strength and tone. Normal symmetric reflexes. Normal coordination and gait  EKG not performed today  ASSESSMENT AND PLAN:   Atypical chest pain History of atypical chest pain better with PPI with recent coronary calcium score performed 12/18/2019 which was zero suggesting no evidence of CAD.  Dyspnea on exertion History of dyspnea exertion with recent 2D echo performed 12/18/2019 which was essentially normal.  Essential hypertension Patient has been hypertensive lately.  She is kept her blood pressure log with systolic blood pressures in the one 50-1 80 range and diastolics in the 23-536 range.  She does say she watches her salt intake.  We will start her on amlodipine 5 mg a day and we will have her keep a blood pressure log for 4 weeks.  She will see a Pharm.D. back after that for review and further medication modification.  Morbid obesity (HCC) BMI of 33.  Can refer her to Dr. Migdalia Dk office, nutrition and wellness, for diet and weight reduction.      Lorretta Harp MD FACP,FACC,FAHA, FSCAI 03/04/2020 2:00 PM

## 2020-03-04 NOTE — Assessment & Plan Note (Signed)
History of atypical chest pain better with PPI with recent coronary calcium score performed 12/18/2019 which was zero suggesting no evidence of CAD.

## 2020-03-06 ENCOUNTER — Encounter: Payer: Self-pay | Admitting: Physician Assistant

## 2020-03-06 ENCOUNTER — Ambulatory Visit (INDEPENDENT_AMBULATORY_CARE_PROVIDER_SITE_OTHER): Payer: BLUE CROSS/BLUE SHIELD | Admitting: Physician Assistant

## 2020-03-06 DIAGNOSIS — M25562 Pain in left knee: Secondary | ICD-10-CM

## 2020-03-06 DIAGNOSIS — M25561 Pain in right knee: Secondary | ICD-10-CM | POA: Diagnosis not present

## 2020-03-06 DIAGNOSIS — M5442 Lumbago with sciatica, left side: Secondary | ICD-10-CM

## 2020-03-06 DIAGNOSIS — G8929 Other chronic pain: Secondary | ICD-10-CM

## 2020-03-06 MED ORDER — METHYLPREDNISOLONE ACETATE 40 MG/ML IJ SUSP
40.0000 mg | INTRAMUSCULAR | Status: AC | PRN
  Administered 2020-03-06: 40 mg via INTRA_ARTICULAR

## 2020-03-06 MED ORDER — LIDOCAINE HCL 1 % IJ SOLN
5.0000 mL | INTRAMUSCULAR | Status: AC | PRN
  Administered 2020-03-06: 5 mL

## 2020-03-06 NOTE — Progress Notes (Signed)
Office Visit Note   Patient: Hannah Short           Date of Birth: 07/26/1956           MRN: [id number] Visit Date: 03/06/2020              Requested by: Lanelle Bal, PA-C [place],  [place] [address] PCP: Lanelle Bal, PA-C  No chief complaint on file.     HPI: This is a pleasant woman who has a history of lower back pain.  She also has a history of bilateral knee pain.  Her MRI done in February did not show any pathology in her lower back.  At that time she was advised by Dr. Sharol Given to begin physical therapy which she declined.  She is wondering if perhaps an injection in her knees may help.  She describes the pain from the knee into her thighs bilaterally.  She does say however that she has had a shot in her left knee in the past and has found it quite helpful.  Assessment & Plan: Visit Diagnoses:  1. Chronic left-sided low back pain with left-sided sciatica     Plan: Bilateral knees were injected today.  Of also ordered physical therapy.  She will follow-up in 4 weeks.  Follow-Up Instructions: No follow-ups on file.   Ortho Exam  Patient is alert, oriented, no adenopathy, well-dressed, normal affect, normal respiratory effort. Focused examination nation of her knee she does have some bilateral medial joint line tenderness no effusions she has good strength with plantar flexion dorsiflexion and negative straight leg raise  Imaging: No results found. No images are attached to the encounter.  Labs: No results found for: HGBA1C, ESRSEDRATE, CRP, LABURIC, REPTSTATUS, GRAMSTAIN, CULT, LABORGA   No results found for: ALBUMIN, PREALBUMIN, LABURIC  No results found for: MG No results found for: VD25OH  No results found for: PREALBUMIN No flowsheet data found.   There is no height or weight on file to calculate BMI.  Orders:  Orders Placed This Encounter  Procedures  . Ambulatory referral to Physical Therapy   No orders of the defined types were placed in  this encounter.    Procedures: Large Joint Inj: bilateral knee on 03/06/2020 4:52 PM Indications: pain and diagnostic evaluation Details: 22 G 1.5 in needle, anteromedial approach  Arthrogram: No  Medications (Right): 5 mL lidocaine 1 %; 40 mg methylPREDNISolone acetate 40 MG/ML Outcome: tolerated well, no immediate complications Procedure, treatment alternatives, risks and benefits explained, specific risks discussed. Consent was given by the patient.      Clinical Data: No additional findings.  ROS:  All other systems negative, except as noted in the HPI. Review of Systems  Objective: Vital Signs: There were no vitals taken for this visit.  Specialty Comments:  No specialty comments available.  PMFS History: Patient Active Problem List   Diagnosis Date Noted  . Essential hypertension 03/04/2020  . Morbid obesity ([place]) 03/04/2020  . Dyspnea on exertion 11/28/2019  . Abdominal pain 11/19/2016  . Change in bowel habits 11/19/2016  . Atypical chest pain 11/19/2016  . Bloating 11/19/2016  . Migraine   . Fibromyalgia    Past Medical History:  Diagnosis Date  . Anemia   . Anxiety   . Arthritis   . Fibromyalgia   . GERD (gastroesophageal reflux disease)   . Heart murmur    mitral valve prolapse  . Hypertension   . Migraine   . Neck pain  Family History  Problem Relation Age of Onset  . Heart failure Mother   . Heart failure Father     Past Surgical History:  Procedure Laterality Date  . CESAREAN SECTION    . HERNIA REPAIR     Social History   Occupational History  . Occupation: employed  Tobacco Use  . Smoking status: Never Smoker  . Smokeless tobacco: Never Used  Substance and Sexual Activity  . Alcohol use: No  . Drug use: No  . Sexual activity: Not on file

## 2020-03-11 DIAGNOSIS — G43719 Chronic migraine without aura, intractable, without status migrainosus: Secondary | ICD-10-CM | POA: Diagnosis not present

## 2020-03-26 ENCOUNTER — Other Ambulatory Visit: Payer: Self-pay

## 2020-03-26 ENCOUNTER — Ambulatory Visit (HOSPITAL_COMMUNITY): Payer: BLUE CROSS/BLUE SHIELD | Attending: Physician Assistant | Admitting: Physical Therapy

## 2020-03-26 ENCOUNTER — Encounter (HOSPITAL_COMMUNITY): Payer: Self-pay | Admitting: Physical Therapy

## 2020-03-26 DIAGNOSIS — G8929 Other chronic pain: Secondary | ICD-10-CM

## 2020-03-26 DIAGNOSIS — M6281 Muscle weakness (generalized): Secondary | ICD-10-CM

## 2020-03-26 DIAGNOSIS — M5442 Lumbago with sciatica, left side: Secondary | ICD-10-CM

## 2020-03-26 DIAGNOSIS — M25562 Pain in left knee: Secondary | ICD-10-CM

## 2020-03-26 DIAGNOSIS — M25561 Pain in right knee: Secondary | ICD-10-CM | POA: Diagnosis not present

## 2020-03-26 DIAGNOSIS — M5440 Lumbago with sciatica, unspecified side: Secondary | ICD-10-CM | POA: Diagnosis not present

## 2020-03-26 NOTE — Therapy (Signed)
[place], [place], [address] Phone: [phone]   Fax:  [phone]  Physical Therapy Evaluation  Patient Details  Name: Hannah Short MRN: [id number] Date of Birth: 07-May-1956 Referring Provider (PT): Bevely Palmer Persons, [place]   Encounter Date: 03/26/2020   PT End of Session - 03/26/20 1727    Visit Number 1    Number of Visits 12    Date for PT Re-Evaluation 05/07/20    Authorization Type BCBS    Authorization - Visit Number 1    Authorization - Number of Visits 30    Progress Note Due on Visit 10    PT Start Time 2595    PT Stop Time 1527    PT Time Calculation (min) 50 min    Activity Tolerance Patient tolerated treatment well    Behavior During Therapy [name] for tasks assessed/performed           Past Medical History:  Diagnosis Date  . Anemia   . Anxiety   . Arthritis   . Fibromyalgia   . GERD (gastroesophageal reflux disease)   . Heart murmur    mitral valve prolapse  . Hypertension   . Migraine   . Neck pain     Past Surgical History:  Procedure Laterality Date  . CESAREAN SECTION    . HERNIA REPAIR      There were no vitals filed for this visit.    Subjective Assessment - 03/26/20 1446    Subjective Patient reported that she fell in January 2020 onto her knees and that she had physical therapy scheduled, but that she did not ever get her physical therapy due to the Covid-19 pandemic. She reported that she has lower back pain from modifying for the knee pain from 3 falls she's had onto her knees over the years. She reported that she cannot bear weight well on her right knee. Patient report that the pain radiates all the way down her left leg down to her foot. She stated that the pain in her right leg radiates down her thigh to her knee. Denied any major changes in her bowel or bladder function.    Patient is accompained by: Family member   Daughter, Joellen [place]   Pertinent History History of  several falls onto her knees during her life    Limitations Standing;Walking    How long can you stand comfortably? 5-10 minutes    How long can you walk comfortably? 5-10 minutes    Diagnostic tests X-ray, MRI lumbar and x-ray of knees    Patient Stated Goals Make it so her knees and back don't hurt    Currently in Pain? No/denies              [facility] PT Assessment - 03/26/20 0001      Assessment   Medical Diagnosis Chronic Left-sided LBP with left-sided sciatica, bilateral knee pain    Referring Provider (PT) Bevely Palmer Persons, PA    Onset Date/Surgical Date --   Many years ago; increased pain in January 2020   Next MD Visit 04/06/20    Prior Therapy Yes, on knees      Precautions   Precautions None      Restrictions   Weight Bearing Restrictions No      Balance Screen   Has the patient fallen in the past 6 months No      [place] residence  Living Arrangements Spouse/significant other    Home Layout Multi-level    Alternate Level Stairs-Number of Steps 12    Alternate Level Stairs-Rails Right   Right going up   Home Equipment None      Prior Function   Level of Independence Independent    Vocation Full time employment    Vocation Requirements CNA; taking care of people      Cognition   Overall Cognitive Status Within Functional Limits for tasks assessed      Observation/Other Assessments   Observations No noted edema    Focus on Therapeutic Outcomes (FOTO)  Perform next session      ROM / Strength   AROM / PROM / Strength AROM;Strength      AROM   AROM Assessment Site Lumbar;Knee    Right/Left Knee Right;Left    Right Knee Extension 0    Right Knee Flexion 121    Left Knee Extension 0    Left Knee Flexion 105    Lumbar Flexion 50% limited; painful in low back gower sign    Lumbar Extension 75% limited; painful    Lumbar - Right Side Bend 25% limited; painful     Lumbar - Left Side Bend 25% limited; painful      Lumbar - Right Rotation 25% limited; painful    Lumbar - Left Rotation 25% limited; painful       Strength   Strength Assessment Site Hip;Knee;Ankle    Right/Left Hip Right;Left    Right Hip Flexion 4/5    Right Hip Extension 2+/5    Right Hip ABduction 4/5    Left Hip Flexion 4-/5    Left Hip Extension 2+/5    Left Hip ABduction 4/5    Right/Left Knee Right;Left    Right Knee Flexion 5/5    Right Knee Extension 5/5    Left Knee Flexion 5/5    Left Knee Extension 5/5    Right/Left Ankle Right;Left    Right Ankle Dorsiflexion 5/5    Left Ankle Dorsiflexion 5/5      Flexibility   Soft Tissue Assessment /Muscle Length yes    Hamstrings 25% limited bilaterally      Palpation   Palpation comment TTP of lumbar spine and bilateral glutes                      Objective measurements completed on examination: See above findings.               PT Education - 03/26/20 1726    Education Details Discussed examination findings, POC, and initial HEP.    Person(s) Educated Patient    Methods Explanation;Handout    Comprehension Verbalized understanding            PT Short Term Goals - 03/26/20 1736      PT SHORT TERM GOAL #1   Title Patient will report understanding and regular compliance with HEP to improve strength, mobility and decrease pain.    Time 3    Period Weeks    Status New    Target Date 04/16/20      PT SHORT TERM GOAL #2   Title Patient will report improvement in overall subjective complaint of at least 25% for improved QOL.    Time 3    Period Weeks    Status New    Target Date 04/16/20             PT Long Term Goals -  03/26/20 1737      PT LONG TERM GOAL #1   Title Patient will report improvement in overall subjective complaint of at least 50% for improved QOL.    Time 6    Period Weeks    Status New    Target Date 05/07/20      PT LONG TERM GOAL #2   Title Patient will demonstrate improvement of at least 1/2 MMT strength  grade indicating improved strength for improved functional mobility.    Time 6    Period Weeks    Status New    Target Date 05/07/20      PT LONG TERM GOAL #3   Title Patient will report that her pain has not radiated past her knee for at least 1 week indicating improved centralization of symptoms.    Time 6    Period Weeks    Status New    Target Date 05/07/20      PT LONG TERM GOAL #4   Title Patient will report ability to walk for at least 30 minutes in order to go to grocery store without issues.    Time 6    Period Weeks    Status New    Target Date 05/07/20                  Plan - 03/26/20 1745    Clinical Impression Statement Patient is a 64 year old female who presents to outpatient physical therapy with complaint of chronic back pain and bilateral knee pain which has been ongoing for over a year. Upon examination, patient demonstrates decreased lumbar AROM, decreased left knee flexion, and decreased lower extremity strength. Patient is currently limited in her functional mobility due to pain as well which is interfering with her participation in daily activities and leisure activities. Patient would benefit from skilled physical therapy in order to address the abovementioned deficits and help patient return to her PLOF.    Personal Factors and Comorbidities Comorbidity 3+    Comorbidities Fibromyalgia, HTN, history of falls, hiatal hernia, anxiety    Examination-Activity Limitations Stand;Locomotion Level;Bend;Transfers;Squat;Stairs    Examination-Participation Restrictions Cleaning;Meal Prep;Yard Work;Community Activity;Occupation;Laundry;Shop    Stability/Clinical Decision Making Evolving/Moderate complexity    Clinical Decision Making Moderate    Rehab Potential Fair    PT Frequency 2x / week    PT Duration 6 weeks    PT Treatment/Interventions ADLs/Self Care Home Management;Aquatic Therapy;Cryotherapy;Electrical Stimulation;Moist Heat;DME Instruction;Gait  training;Stair training;Functional mobility training;Therapeutic activities;Therapeutic exercise;Balance training;Neuromuscular re-education;Patient/family education;Orthotic Fit/Training;Manual techniques;Passive range of motion;Dry needling;Energy conservation;Taping    PT Next Visit Plan Review eval/goals. Perform FOTO, review initial HEP. Focus on core strengthening and mat level LE strengthening. STM for pain relief.    PT Home Exercise Plan 03/26/20: Seated ab set    Consulted and Agree with Plan of Care Patient           Patient will benefit from skilled therapeutic intervention in order to improve the following deficits and impairments:  Improper body mechanics, Pain, Decreased mobility, Decreased activity tolerance, Decreased endurance, Decreased range of motion, Decreased strength, Difficulty walking, Hypomobility  Visit Diagnosis: Chronic bilateral low back pain with sciatica, sciatica laterality unspecified  Left knee pain, unspecified chronicity  Right knee pain, unspecified chronicity  Muscle weakness (generalized)     Problem List Patient Active Problem List   Diagnosis Date Noted  . Essential hypertension 03/04/2020  . Morbid obesity ([place]) 03/04/2020  . Dyspnea on exertion 11/28/2019  . Abdominal pain 11/19/2016  .  Change in bowel habits 11/19/2016  . Atypical chest pain 11/19/2016  . Bloating 11/19/2016  . Migraine   . Fibromyalgia    Clarene Critchley PT, DPT 5:49 PM, 03/26/20 [place] [address] [place], [place], [address] Phone: [phone]   Fax:  [phone]  Name: Hannah Short MRN: [id number] Date of Birth: [birth year]

## 2020-03-26 NOTE — Patient Instructions (Signed)
Access Code: [email or web address] URL: [email or web address] Date: 03/26/2020 Prepared by: Ishmael Berkovich  Exercises Isometric Abdominal Contraction - 1 x daily - 7 x weekly - 1 sets - 10 reps - 5 seconds hold

## 2020-04-03 ENCOUNTER — Other Ambulatory Visit: Payer: Self-pay

## 2020-04-03 ENCOUNTER — Ambulatory Visit (HOSPITAL_COMMUNITY): Payer: BLUE CROSS/BLUE SHIELD | Attending: Physician Assistant | Admitting: Physical Therapy

## 2020-04-03 ENCOUNTER — Ambulatory Visit: Payer: BLUE CROSS/BLUE SHIELD

## 2020-04-03 ENCOUNTER — Encounter (HOSPITAL_COMMUNITY): Payer: Self-pay | Admitting: Physical Therapy

## 2020-04-03 DIAGNOSIS — M25561 Pain in right knee: Secondary | ICD-10-CM | POA: Diagnosis not present

## 2020-04-03 DIAGNOSIS — M6281 Muscle weakness (generalized): Secondary | ICD-10-CM | POA: Diagnosis not present

## 2020-04-03 DIAGNOSIS — M25562 Pain in left knee: Secondary | ICD-10-CM | POA: Diagnosis not present

## 2020-04-03 DIAGNOSIS — M5440 Lumbago with sciatica, unspecified side: Secondary | ICD-10-CM | POA: Diagnosis not present

## 2020-04-03 DIAGNOSIS — G8929 Other chronic pain: Secondary | ICD-10-CM | POA: Diagnosis not present

## 2020-04-03 NOTE — Therapy (Signed)
[place], [place], [address] Phone: [phone]   Fax:  [phone]  Physical Therapy Treatment  Patient Details  Name: Hannah Short MRN: [id number] Date of Birth: August 01, 1956 Referring Provider (PT): Bevely Palmer Persons, [place]   Encounter Date: 04/03/2020   PT End of Session - 04/03/20 0957    Visit Number 2    Number of Visits 12    Date for PT Re-Evaluation 05/07/20    Authorization Type BCBS    Authorization - Visit Number 2    Authorization - Number of Visits 30    Progress Note Due on Visit 10    PT Start Time [phone]    PT Stop Time 1026    PT Time Calculation (min) 40 min    Activity Tolerance Patient tolerated treatment well    Behavior During Therapy [facility] for tasks assessed/performed           Past Medical History:  Diagnosis Date  . Anemia   . Anxiety   . Arthritis   . Fibromyalgia   . GERD (gastroesophageal reflux disease)   . Heart murmur    mitral valve prolapse  . Hypertension   . Migraine   . Neck pain     Past Surgical History:  Procedure Laterality Date  . CESAREAN SECTION    . HERNIA REPAIR      There were no vitals filed for this visit.   Subjective Assessment - 04/03/20 0951    Subjective Patient reported that she is having 6/10 pain currently in her low back. She reported that some hay hit her eye and she is having a little eye problem which she saw her MD to treat.    Patient is accompained by: Family member   Daughter, Joellen [place]   Pertinent History History of several falls onto her knees during her life    Limitations Standing;Walking    How long can you stand comfortably? 5-10 minutes    How long can you walk comfortably? 5-10 minutes    Diagnostic tests X-ray, MRI lumbar and x-ray of knees    Patient Stated Goals Make it so her knees and back don't hurt    Currently in Pain? Yes    Pain Score 6     Pain Location Back    Pain Orientation Lower    Pain Descriptors /  Indicators Tightness    Pain Type Chronic pain    Pain Onset More than a month ago    Pain Frequency Intermittent              [facility] PT Assessment - 04/03/20 0001      Observation/Other Assessments   Focus on Therapeutic Outcomes (FOTO)  28.4%                         [facility] Adult PT Treatment/Exercise - 04/03/20 0001      Exercises   Exercises Lumbar      Lumbar Exercises: Stretches   Other Lumbar Stretch Exercise LTR with bil LEs on green physioball 10x10'' each direction      Lumbar Exercises: Supine   Ab Set 15 reps;5 seconds    AB Set Limitations LEs at 90/90 on green physioball    Bridge 15 reps    Bridge Limitations Partial ROM    Other Supine Lumbar Exercises Hamstring set 10x5''       Manual Therapy   Manual Therapy Soft tissue  mobilization    Manual therapy comments all manual therapy completed separately from other skilled interventions    Soft tissue mobilization STM patient prone to lumbar and thoracic paraspinals for pain relief                    PT Short Term Goals - 04/03/20 0959      PT SHORT TERM GOAL #1   Title Patient will report understanding and regular compliance with HEP to improve strength, mobility and decrease pain.    Time 3    Period Weeks    Status On-going    Target Date 04/16/20      PT SHORT TERM GOAL #2   Title Patient will report improvement in overall subjective complaint of at least 25% for improved QOL.    Time 3    Period Weeks    Status On-going    Target Date 04/16/20             PT Long Term Goals - 04/03/20 0959      PT LONG TERM GOAL #1   Title Patient will report improvement in overall subjective complaint of at least 50% for improved QOL.    Time 6    Period Weeks    Status On-going      PT LONG TERM GOAL #2   Title Patient will demonstrate improvement of at least 1/2 MMT strength grade indicating improved strength for improved functional mobility.    Time 6    Period Weeks     Status On-going      PT LONG TERM GOAL #3   Title Patient will report that her pain has not radiated past her knee for at least 1 week indicating improved centralization of symptoms.    Time 6    Period Weeks    Status On-going      PT LONG TERM GOAL #4   Title Patient will report ability to walk for at least 30 minutes in order to go to grocery store without issues.    Time 6    Period Weeks    Status On-going                 Plan - 04/03/20 1014    Clinical Impression Statement Focused on abdominal activation and introduced use of physioball to maintain flexion of hip and knees to reduce stress on lower back. Educated patient on updating HEP to include physioball exercises as patient reported an improvement in symptoms with this. Ended session with STM to lumbar and thoracic paraspinals with patient reporting a reduction in symptoms following manual therapy.    Personal Factors and Comorbidities Comorbidity 3+    Comorbidities Fibromyalgia, HTN, history of falls, hiatal hernia, anxiety    Examination-Activity Limitations Stand;Locomotion Level;Bend;Transfers;Squat;Stairs    Examination-Participation Restrictions Cleaning;Meal Prep;Yard Work;Community Activity;Occupation;Laundry;Shop    Stability/Clinical Decision Making Evolving/Moderate complexity    Rehab Potential Fair    PT Frequency 2x / week    PT Duration 6 weeks    PT Treatment/Interventions ADLs/Self Care Home Management;Aquatic Therapy;Cryotherapy;Electrical Stimulation;Moist Heat;DME Instruction;Gait training;Stair training;Functional mobility training;Therapeutic activities;Therapeutic exercise;Balance training;Neuromuscular re-education;Patient/family education;Orthotic Fit/Training;Manual techniques;Passive range of motion;Dry needling;Energy conservation;Taping    PT Next Visit Plan Focus on core strengthening and mat level LE strengthening. STM for pain relief. Continue physioball exercises. Add bent knee raise  next session with ab set.    PT Home Exercise Plan 03/26/20: Seated ab set; 04/03/20: Physioball ab set and hamstring set    Consulted  and Agree with Plan of Care Patient           Patient will benefit from skilled therapeutic intervention in order to improve the following deficits and impairments:  Improper body mechanics, Pain, Decreased mobility, Decreased activity tolerance, Decreased endurance, Decreased range of motion, Decreased strength, Difficulty walking, Hypomobility  Visit Diagnosis: Chronic bilateral low back pain with sciatica, sciatica laterality unspecified  Left knee pain, unspecified chronicity  Right knee pain, unspecified chronicity  Muscle weakness (generalized)     Problem List Patient Active Problem List   Diagnosis Date Noted  . Essential hypertension 03/04/2020  . Morbid obesity ([place]) 03/04/2020  . Dyspnea on exertion 11/28/2019  . Abdominal pain 11/19/2016  . Change in bowel habits 11/19/2016  . Atypical chest pain 11/19/2016  . Bloating 11/19/2016  . Migraine   . Fibromyalgia    Clarene Critchley PT, DPT 10:39 AM, 04/03/20 [facility] [place] [address] [place], [place], [address] Phone: [phone]   Fax:  [phone]  Name: Hannah Short MRN: [id number] Date of Birth: Apr 03, 1956

## 2020-04-08 ENCOUNTER — Ambulatory Visit: Payer: BLUE CROSS/BLUE SHIELD | Admitting: Orthopedic Surgery

## 2020-04-09 ENCOUNTER — Ambulatory Visit (HOSPITAL_COMMUNITY): Payer: BLUE CROSS/BLUE SHIELD | Admitting: Physical Therapy

## 2020-04-09 ENCOUNTER — Other Ambulatory Visit: Payer: Self-pay

## 2020-04-09 DIAGNOSIS — G8929 Other chronic pain: Secondary | ICD-10-CM

## 2020-04-09 DIAGNOSIS — M25561 Pain in right knee: Secondary | ICD-10-CM

## 2020-04-09 DIAGNOSIS — M25562 Pain in left knee: Secondary | ICD-10-CM | POA: Diagnosis not present

## 2020-04-09 DIAGNOSIS — M6281 Muscle weakness (generalized): Secondary | ICD-10-CM | POA: Diagnosis not present

## 2020-04-09 DIAGNOSIS — M5440 Lumbago with sciatica, unspecified side: Secondary | ICD-10-CM

## 2020-04-09 NOTE — Therapy (Addendum)
[place], [place], [address] Phone: [phone]   Fax:  [phone]  Physical Therapy Treatment  Patient Details  Name: Hannah Short MRN: [id number] Date of Birth: 24-Jun-1956 Referring Provider (PT): Bevely Palmer Persons, [place]   Encounter Date: 04/09/2020   PT End of Session - 04/09/20 1132    Visit Number 3    Number of Visits 12    Date for PT Re-Evaluation 05/07/20    Authorization Type BCBS    Authorization - Visit Number 3    Authorization - Number of Visits 30    Progress Note Due on Visit 10    PT Start Time 1055    PT Stop Time 1134    PT Time Calculation (min) 39 min    Activity Tolerance Patient tolerated treatment well    Behavior During Therapy [facility] for tasks assessed/performed           Past Medical History:  Diagnosis Date  . Anemia   . Anxiety   . Arthritis   . Fibromyalgia   . GERD (gastroesophageal reflux disease)   . Heart murmur    mitral valve prolapse  . Hypertension   . Migraine   . Neck pain     Past Surgical History:  Procedure Laterality Date  . CESAREAN SECTION    . HERNIA REPAIR      There were no vitals filed for this visit.   Subjective Assessment - 04/09/20 1102    Subjective pt states her pain was 12/10 on saturday from moving furniture and reports the numbness on Lt lateral leg was bad yesterday.  Currently no pain just pressure in lower back.    Currently in Pain? No/denies                             [facility] Adult PT Treatment/Exercise - 04/09/20 0001      Lumbar Exercises: Stretches   Active Hamstring Stretch Right;Left;2 reps;30 seconds    Lower Trunk Rotation 10 seconds;Limitations    Lower Trunk Rotation Limitations 10 reps    Piriformis Stretch Right;Left;30 seconds    Piriformis Stretch Limitations seated 3 reps each      Lumbar Exercises: Supine   Ab Set 15 reps;5 seconds    Bent Knee Raise 10 reps    Bridge 15 reps    Bridge  Limitations Partial ROM    Straight Leg Raise 15 reps    Straight Leg Raises Limitations with ab set      Lumbar Exercises: Sidelying   Hip Abduction Both;15 reps                    PT Short Term Goals - 04/03/20 0959      PT SHORT TERM GOAL #1   Title Patient will report understanding and regular compliance with HEP to improve strength, mobility and decrease pain.    Time 3    Period Weeks    Status On-going    Target Date 04/16/20      PT SHORT TERM GOAL #2   Title Patient will report improvement in overall subjective complaint of at least 25% for improved QOL.    Time 3    Period Weeks    Status On-going    Target Date 04/16/20             PT Long Term Goals - 04/03/20 8250  PT LONG TERM GOAL #1   Title Patient will report improvement in overall subjective complaint of at least 50% for improved QOL.    Time 6    Period Weeks    Status On-going      PT LONG TERM GOAL #2   Title Patient will demonstrate improvement of at least 1/2 MMT strength grade indicating improved strength for improved functional mobility.    Time 6    Period Weeks    Status On-going      PT LONG TERM GOAL #3   Title Patient will report that her pain has not radiated past her knee for at least 1 week indicating improved centralization of symptoms.    Time 6    Period Weeks    Status On-going      PT LONG TERM GOAL #4   Title Patient will report ability to walk for at least 30 minutes in order to go to grocery store without issues.    Time 6    Period Weeks    Status On-going                 Plan - 04/09/20 1142    Clinical Impression Statement pt was late for appointment today.  Worked on core stab and general mobiltiy.  Added piriformis stretch in seated position with more tightness noted on Lt vs Rt. Pt was given printout of this actvity to add to HEP.  Also added SLR and BNR with core stab, hamstring stretch and sidelying hip abduction activty. Noted weakness in  bil abductors and cues for proper form. did not have time this session to complete manual therapy.    Personal Factors and Comorbidities Comorbidity 3+    Comorbidities Fibromyalgia, HTN, history of falls, hiatal hernia, anxiety    Examination-Activity Limitations Stand;Locomotion Level;Bend;Transfers;Squat;Stairs    Examination-Participation Restrictions Cleaning;Meal Prep;Yard Work;Community Activity;Occupation;Laundry;Shop    Stability/Clinical Decision Making Evolving/Moderate complexity    Rehab Potential Fair    PT Frequency 2x / week    PT Duration 6 weeks    PT Treatment/Interventions ADLs/Self Care Home Management;Aquatic Therapy;Cryotherapy;Electrical Stimulation;Moist Heat;DME Instruction;Gait training;Stair training;Functional mobility training;Therapeutic activities;Therapeutic exercise;Balance training;Neuromuscular re-education;Patient/family education;Orthotic Fit/Training;Manual techniques;Passive range of motion;Dry needling;Energy conservation;Taping    PT Next Visit Plan Focus on core strengthening and mat level LE strengthening. STM for pain relief. Continue mobility exercises.    PT Home Exercise Plan 03/26/20: Seated ab set; 04/03/20: Physioball ab set and hamstring set  04/09/20:  seated piriformis stretch    Consulted and Agree with Plan of Care Patient           Patient will benefit from skilled therapeutic intervention in order to improve the following deficits and impairments:  Improper body mechanics, Pain, Decreased mobility, Decreased activity tolerance, Decreased endurance, Decreased range of motion, Decreased strength, Difficulty walking, Hypomobility  Visit Diagnosis: Chronic bilateral low back pain with sciatica, sciatica laterality unspecified  Left knee pain, unspecified chronicity  Right knee pain, unspecified chronicity  Muscle weakness (generalized)     Problem List Patient Active Problem List   Diagnosis Date Noted  . Essential hypertension  03/04/2020  . Morbid obesity ([place]) 03/04/2020  . Dyspnea on exertion 11/28/2019  . Abdominal pain 11/19/2016  . Change in bowel habits 11/19/2016  . Atypical chest pain 11/19/2016  . Bloating 11/19/2016  . Migraine   . Fibromyalgia    Teena Irani, PTA/CLT [phone]  Roseanne Reno B 04/09/2020, 11:49 AM  [facility] [address] [place], [place], [address] Phone: [phone]   Fax:  [phone]  Name: Hannah Short MRN: [id number] Date of Birth: [birth year]

## 2020-04-09 NOTE — Patient Instructions (Signed)
Piriformis Stretch, Sitting    Sit, one ankle on opposite knee, same-side hand on crossed knee. Push down on knee, keeping spine straight. Lean torso forward, with flat back, until tension is felt in hamstrings and gluteals of crossed-leg side. Hold _30__ seconds. Repeat _3__ times per session. Do _2__ sessions per day.  Copyright  VHI. All rights reserved.   

## 2020-04-14 ENCOUNTER — Telehealth (HOSPITAL_COMMUNITY): Payer: Self-pay

## 2020-04-14 ENCOUNTER — Ambulatory Visit (HOSPITAL_COMMUNITY): Payer: BLUE CROSS/BLUE SHIELD | Admitting: Physical Therapy

## 2020-04-14 ENCOUNTER — Encounter (HOSPITAL_COMMUNITY): Payer: Self-pay | Admitting: Physical Therapy

## 2020-04-14 ENCOUNTER — Other Ambulatory Visit: Payer: Self-pay

## 2020-04-14 DIAGNOSIS — M25561 Pain in right knee: Secondary | ICD-10-CM | POA: Diagnosis not present

## 2020-04-14 DIAGNOSIS — M5440 Lumbago with sciatica, unspecified side: Secondary | ICD-10-CM | POA: Diagnosis not present

## 2020-04-14 DIAGNOSIS — G8929 Other chronic pain: Secondary | ICD-10-CM

## 2020-04-14 DIAGNOSIS — M25562 Pain in left knee: Secondary | ICD-10-CM | POA: Diagnosis not present

## 2020-04-14 DIAGNOSIS — M6281 Muscle weakness (generalized): Secondary | ICD-10-CM | POA: Diagnosis not present

## 2020-04-14 NOTE — Telephone Encounter (Signed)
Patient appointment this am at 11:15 will be counted as no show. Next appointment Thursday, 9/16 at 11:15. Please call if need to changes appointments. [place] office number provided.  Floria Raveling. Hartnett-Rands, MS, PT Per [place] #[id number] 04/14/2020 11:38 am

## 2020-04-14 NOTE — Patient Instructions (Signed)
Access Code: [email or web address] URL: [email or web address] Date: 04/14/2020 Prepared by: Josue Hector  Exercises Clamshell - 2 x daily - 7 x weekly - 2 sets - 10 reps Supine Bridge - 2 x daily - 7 x weekly - 2 sets - 10 reps - 5 hold

## 2020-04-14 NOTE — Therapy (Signed)
[place], [place], [address] Phone: [phone]   Fax:  [phone]  Physical Therapy Treatment  Patient Details  Name: Hannah Short MRN: [id number] Date of Birth: [birth year] Referring Provider (PT): Bevely Palmer Persons, [place]   Encounter Date: 04/14/2020   PT End of Session - 04/14/20 1307    Visit Number 4    Number of Visits 12    Date for PT Re-Evaluation 05/07/20    Authorization Type BCBS    Authorization - Visit Number 4    Authorization - Number of Visits 30    Progress Note Due on Visit 10    PT Start Time 1300    PT Stop Time 1342    PT Time Calculation (min) 42 min    Activity Tolerance Patient tolerated treatment well    Behavior During Therapy [facility] for tasks assessed/performed           Past Medical History:  Diagnosis Date   Anemia    Anxiety    Arthritis    Fibromyalgia    GERD (gastroesophageal reflux disease)    Heart murmur    mitral valve prolapse   Hypertension    Migraine    Neck pain     Past Surgical History:  Procedure Laterality Date   CESAREAN SECTION     HERNIA REPAIR      There were no vitals filed for this visit.   Subjective Assessment - 04/14/20 1304    Subjective Patient says her back feels much better today than last week. Says she had a message on Saturday which was helpful. Says she has been keeping up with exercises at home.    How long can you stand comfortably? 5-10 minutes    How long can you walk comfortably? 5-10 minutes    Diagnostic tests X-ray, MRI lumbar and x-ray of knees    Patient Stated Goals Make it so her knees and back don't hurt    Currently in Pain? Yes    Pain Score 2     Pain Location Back    Pain Orientation Lower;Posterior    Pain Descriptors / Indicators Tightness    Pain Type Chronic pain    Pain Onset More than a month ago    Pain Frequency Intermittent                             [facility] Adult PT  Treatment/Exercise - 04/14/20 0001      Lumbar Exercises: Stretches   Piriformis Stretch Right;Left;2 reps;30 seconds      Lumbar Exercises: Supine   Ab Set 20 reps;5 seconds    Bent Knee Raise 20 reps    Dead Bug 10 reps    Bridge 20 reps;5 seconds    Straight Leg Raise 10 reps    Straight Leg Raises Limitations with ab set      Lumbar Exercises: Sidelying   Clam Both;15 reps                    PT Short Term Goals - 04/03/20 0959      PT SHORT TERM GOAL #1   Title Patient will report understanding and regular compliance with HEP to improve strength, mobility and decrease pain.    Time 3    Period Weeks    Status On-going    Target Date 04/16/20      PT SHORT  TERM GOAL #2   Title Patient will report improvement in overall subjective complaint of at least 25% for improved QOL.    Time 3    Period Weeks    Status On-going    Target Date 04/16/20             PT Long Term Goals - 04/03/20 0959      PT LONG TERM GOAL #1   Title Patient will report improvement in overall subjective complaint of at least 50% for improved QOL.    Time 6    Period Weeks    Status On-going      PT LONG TERM GOAL #2   Title Patient will demonstrate improvement of at least 1/2 MMT strength grade indicating improved strength for improved functional mobility.    Time 6    Period Weeks    Status On-going      PT LONG TERM GOAL #3   Title Patient will report that her pain has not radiated past her knee for at least 1 week indicating improved centralization of symptoms.    Time 6    Period Weeks    Status On-going      PT LONG TERM GOAL #4   Title Patient will report ability to walk for at least 30 minutes in order to go to grocery store without issues.    Time 6    Period Weeks    Status On-going                 Plan - 04/14/20 1342    Clinical Impression Statement Patient tolerated session well today. Patient does note some pulling in lumbar during bridging.  Patient educated on anatomy and importance of deep core muscles for lumbar stabilization. Added dead bug for core strength progressions. Patient required verbal cues and demo for proper form, but did improve with practice. Patient educated on updated HEP handout. Patient will continue to benefit from skilled therapy services to progress core strength to reduce pain and improve LOF with ADLs.    Personal Factors and Comorbidities Comorbidity 3+    Comorbidities Fibromyalgia, HTN, history of falls, hiatal hernia, anxiety    Examination-Activity Limitations Stand;Locomotion Level;Bend;Transfers;Squat;Stairs    Examination-Participation Restrictions Cleaning;Meal Prep;Yard Work;Community Activity;Occupation;Laundry;Shop    Stability/Clinical Decision Making Evolving/Moderate complexity    Rehab Potential Fair    PT Frequency 2x / week    PT Duration 6 weeks    PT Treatment/Interventions ADLs/Self Care Home Management;Aquatic Therapy;Cryotherapy;Electrical Stimulation;Moist Heat;DME Instruction;Gait training;Stair training;Functional mobility training;Therapeutic activities;Therapeutic exercise;Balance training;Neuromuscular re-education;Patient/family education;Orthotic Fit/Training;Manual techniques;Passive range of motion;Dry needling;Energy conservation;Taping    PT Next Visit Plan Focus on core strengthening and mat level LE strengthening. STM for pain relief. Continue mobility exercises.    PT Home Exercise Plan 03/26/20: Seated ab set; 04/03/20: Physioball ab set and hamstring set  04/09/20:  seated piriformis stretch 04/14/20: bridge, clam    Consulted and Agree with Plan of Care Patient           Patient will benefit from skilled therapeutic intervention in order to improve the following deficits and impairments:  Improper body mechanics, Pain, Decreased mobility, Decreased activity tolerance, Decreased endurance, Decreased range of motion, Decreased strength, Difficulty walking,  Hypomobility  Visit Diagnosis: Chronic bilateral low back pain with sciatica, sciatica laterality unspecified  Left knee pain, unspecified chronicity  Right knee pain, unspecified chronicity  Muscle weakness (generalized)     Problem List Patient Active Problem List   Diagnosis Date Noted  Essential hypertension 03/04/2020   Morbid obesity ([place]) 03/04/2020   Dyspnea on exertion 11/28/2019   Abdominal pain 11/19/2016   Change in bowel habits 11/19/2016   Atypical chest pain 11/19/2016   Bloating 11/19/2016   Migraine    Fibromyalgia     1:46 PM, 04/14/20 Josue Hector PT DPT  Physical Therapist with [facility]  [phone] [place] [address] [place], [place], [address] Phone: [phone]   Fax:  [phone]  Name: ZAINAH STEVEN MRN: [id number] Date of Birth: 1956-03-17

## 2020-04-17 ENCOUNTER — Ambulatory Visit (HOSPITAL_COMMUNITY): Payer: BLUE CROSS/BLUE SHIELD

## 2020-04-17 ENCOUNTER — Telehealth (HOSPITAL_COMMUNITY): Payer: Self-pay | Admitting: Physical Therapy

## 2020-04-17 NOTE — Telephone Encounter (Signed)
She has been up all night with a headache and will not be here today - r/s for the end of her schedule

## 2020-04-21 ENCOUNTER — Ambulatory Visit (HOSPITAL_COMMUNITY): Payer: BLUE CROSS/BLUE SHIELD | Admitting: Physical Therapy

## 2020-04-21 ENCOUNTER — Encounter (HOSPITAL_COMMUNITY): Payer: Self-pay | Admitting: Physical Therapy

## 2020-04-21 ENCOUNTER — Other Ambulatory Visit: Payer: Self-pay

## 2020-04-21 DIAGNOSIS — M25562 Pain in left knee: Secondary | ICD-10-CM

## 2020-04-21 DIAGNOSIS — M25561 Pain in right knee: Secondary | ICD-10-CM | POA: Diagnosis not present

## 2020-04-21 DIAGNOSIS — M5440 Lumbago with sciatica, unspecified side: Secondary | ICD-10-CM

## 2020-04-21 DIAGNOSIS — G8929 Other chronic pain: Secondary | ICD-10-CM

## 2020-04-21 DIAGNOSIS — M6281 Muscle weakness (generalized): Secondary | ICD-10-CM | POA: Diagnosis not present

## 2020-04-21 NOTE — Therapy (Signed)
[place], [place], [address] Phone: [phone]   Fax:  [phone]  Physical Therapy Treatment  Patient Details  Name: Hannah Short MRN: [id number] Date of Birth: 07/20/1956 Referring Provider (PT): Bevely Palmer Persons, [place]   Encounter Date: 04/21/2020   PT End of Session - 04/21/20 1127    Visit Number 5    Number of Visits 12    Date for PT Re-Evaluation 05/07/20    Authorization Type BCBS    Authorization - Visit Number 5    Authorization - Number of Visits 30    Progress Note Due on Visit 10    PT Start Time 1120    PT Stop Time 1200    PT Time Calculation (min) 40 min    Activity Tolerance Patient tolerated treatment well    Behavior During Therapy [facility] for tasks assessed/performed           Past Medical History:  Diagnosis Date  . Anemia   . Anxiety   . Arthritis   . Fibromyalgia   . GERD (gastroesophageal reflux disease)   . Heart murmur    mitral valve prolapse  . Hypertension   . Migraine   . Neck pain     Past Surgical History:  Procedure Laterality Date  . CESAREAN SECTION    . HERNIA REPAIR      There were no vitals filed for this visit.   Subjective Assessment - 04/21/20 1126    Subjective Patient reports 10/10 pain in mid back starting from sitting in a chair last night.    How long can you stand comfortably? 5-10 minutes    How long can you walk comfortably? 5-10 minutes    Diagnostic tests X-ray, MRI lumbar and x-ray of knees    Patient Stated Goals Make it so her knees and back don't hurt    Currently in Pain? Yes    Pain Score 6     Pain Location Back    Pain Orientation Mid;Posterior    Pain Descriptors / Indicators Tender    Pain Type Chronic pain    Pain Onset More than a month ago    Pain Frequency Constant                             [facility] Adult PT Treatment/Exercise - 04/21/20 0001      Lumbar Exercises: Stretches   Double Knee to Chest  Stretch 5 reps;10 seconds    Hip Flexor Stretch Right;Left;2 reps;30 seconds      Lumbar Exercises: Supine   Ab Set 10 reps;5 seconds    Bent Knee Raise 20 reps    Dead Bug 10 reps    Bridge 20 reps    Straight Leg Raise 10 reps    Straight Leg Raises Limitations with ab set      Lumbar Exercises: Sidelying   Clam Both;20 reps      Lumbar Exercises: Quadruped   Other Quadruped Lumbar Exercises hip extension x10 each                     PT Short Term Goals - 04/03/20 0959      PT SHORT TERM GOAL #1   Title Patient will report understanding and regular compliance with HEP to improve strength, mobility and decrease pain.    Time 3    Period Weeks    Status  On-going    Target Date 04/16/20      PT SHORT TERM GOAL #2   Title Patient will report improvement in overall subjective complaint of at least 25% for improved QOL.    Time 3    Period Weeks    Status On-going    Target Date 04/16/20             PT Long Term Goals - 04/03/20 0959      PT LONG TERM GOAL #1   Title Patient will report improvement in overall subjective complaint of at least 50% for improved QOL.    Time 6    Period Weeks    Status On-going      PT LONG TERM GOAL #2   Title Patient will demonstrate improvement of at least 1/2 MMT strength grade indicating improved strength for improved functional mobility.    Time 6    Period Weeks    Status On-going      PT LONG TERM GOAL #3   Title Patient will report that her pain has not radiated past her knee for at least 1 week indicating improved centralization of symptoms.    Time 6    Period Weeks    Status On-going      PT LONG TERM GOAL #4   Title Patient will report ability to walk for at least 30 minutes in order to go to grocery store without issues.    Time 6    Period Weeks    Status On-going                 Plan - 04/21/20 1640    Clinical Impression Statement Able to progress core strengthening today. Patient with  good overall tolerance and showed improved carry over for form with dead bugs. Added Thomas stretch for hip flexor tightness and quadruped hip extension for core and multifidus strengthening. Patient educated on proper form and function of all added activity. Patient was well challenged with activity in quadruped, requiring verbal cues for maintained TA activation and avoiding pelvic rotation. Patient noted complete pain reduction post session. Educated patient on, and issued updated HEP handout.    Personal Factors and Comorbidities Comorbidity 3+    Comorbidities Fibromyalgia, HTN, history of falls, hiatal hernia, anxiety    Examination-Activity Limitations Stand;Locomotion Level;Bend;Transfers;Squat;Stairs    Examination-Participation Restrictions Cleaning;Meal Prep;Yard Work;Community Activity;Occupation;Laundry;Shop    Stability/Clinical Decision Making Evolving/Moderate complexity    Rehab Potential Fair    PT Frequency 2x / week    PT Duration 6 weeks    PT Treatment/Interventions ADLs/Self Care Home Management;Aquatic Therapy;Cryotherapy;Electrical Stimulation;Moist Heat;DME Instruction;Gait training;Stair training;Functional mobility training;Therapeutic activities;Therapeutic exercise;Balance training;Neuromuscular re-education;Patient/family education;Orthotic Fit/Training;Manual techniques;Passive range of motion;Dry needling;Energy conservation;Taping    PT Next Visit Plan Focus on core strengthening and mat level LE strengthening. STM for pain relief. Continue mobility exercises.    PT Home Exercise Plan 03/26/20: Seated ab set; 04/03/20: Physioball ab set and hamstring set  04/09/20:  seated piriformis stretch 04/14/20: bridge, clam 04/21/20: thomas stretch    Consulted and Agree with Plan of Care Patient           Patient will benefit from skilled therapeutic intervention in order to improve the following deficits and impairments:  Improper body mechanics, Pain, Decreased mobility,  Decreased activity tolerance, Decreased endurance, Decreased range of motion, Decreased strength, Difficulty walking, Hypomobility  Visit Diagnosis: Chronic bilateral low back pain with sciatica, sciatica laterality unspecified  Left knee pain, unspecified chronicity  Right knee  pain, unspecified chronicity  Muscle weakness (generalized)     Problem List Patient Active Problem List   Diagnosis Date Noted  . Essential hypertension 03/04/2020  . Morbid obesity ([place]) 03/04/2020  . Dyspnea on exertion 11/28/2019  . Abdominal pain 11/19/2016  . Change in bowel habits 11/19/2016  . Atypical chest pain 11/19/2016  . Bloating 11/19/2016  . Migraine   . Fibromyalgia     4:45 PM, 04/21/20 Josue Hector PT DPT  Physical Therapist with [facility]  [phone] [place] [address] [place], [place], [place], [address] Phone: [phone]   Fax:  [phone]  Name: Hannah Short MRN: [id number] Date of Birth: 15-Oct-1955

## 2020-04-21 NOTE — Patient Instructions (Signed)
Access Code: [email or web address] URL: [email or web address] Date: 04/21/2020 Prepared by: Josue Hector  Exercises Supine Double Knee to Chest - 2 x daily - 7 x weekly - 1 sets - 5 reps - 10 sec hold Modified Thomas Stretch - 2 x daily - 7 x weekly - 1 sets - 3 reps - 30 sec hold

## 2020-04-24 ENCOUNTER — Other Ambulatory Visit: Payer: Self-pay

## 2020-04-24 ENCOUNTER — Encounter (HOSPITAL_COMMUNITY): Payer: Self-pay | Admitting: Physical Therapy

## 2020-04-24 ENCOUNTER — Ambulatory Visit (HOSPITAL_COMMUNITY): Payer: BLUE CROSS/BLUE SHIELD | Admitting: Physical Therapy

## 2020-04-24 DIAGNOSIS — G8929 Other chronic pain: Secondary | ICD-10-CM | POA: Diagnosis not present

## 2020-04-24 DIAGNOSIS — M25562 Pain in left knee: Secondary | ICD-10-CM

## 2020-04-24 DIAGNOSIS — M25561 Pain in right knee: Secondary | ICD-10-CM | POA: Diagnosis not present

## 2020-04-24 DIAGNOSIS — M6281 Muscle weakness (generalized): Secondary | ICD-10-CM

## 2020-04-24 DIAGNOSIS — M5440 Lumbago with sciatica, unspecified side: Secondary | ICD-10-CM | POA: Diagnosis not present

## 2020-04-24 NOTE — Therapy (Signed)
[place], [place], [address] Phone: [phone]   Fax:  [phone]  Physical Therapy Treatment  Patient Details  Name: Hannah Short MRN: [id number] Date of Birth: [birth year] Referring Provider (PT): Bevely Palmer Persons, [place]   Encounter Date: 04/24/2020   PT End of Session - 04/24/20 1341    Visit Number 6    Number of Visits 12    Date for PT Re-Evaluation 05/07/20    Authorization Type BCBS    Authorization - Visit Number 6    Authorization - Number of Visits 30    Progress Note Due on Visit 10    PT Start Time 0272    PT Stop Time 1352    PT Time Calculation (min) 44 min    Activity Tolerance Patient tolerated treatment well    Behavior During Therapy [facility] for tasks assessed/performed           Past Medical History:  Diagnosis Date  . Anemia   . Anxiety   . Arthritis   . Fibromyalgia   . GERD (gastroesophageal reflux disease)   . Heart murmur    mitral valve prolapse  . Hypertension   . Migraine   . Neck pain     Past Surgical History:  Procedure Laterality Date  . CESAREAN SECTION    . HERNIA REPAIR      There were no vitals filed for this visit.   Subjective Assessment - 04/24/20 1309    Subjective Patient reported that she is doing pretty well today. She stated that she thinks that she may have slept funny on her neck and is a little tender.    How long can you stand comfortably? 5-10 minutes    How long can you walk comfortably? 5-10 minutes    Diagnostic tests X-ray, MRI lumbar and x-ray of knees    Patient Stated Goals Make it so her knees and back don't hurt    Currently in Pain? Yes    Pain Score 2     Pain Location Back    Pain Orientation Mid;Lower    Pain Descriptors / Indicators Tender    Pain Onset More than a month ago                             [facility] Adult PT Treatment/Exercise - 04/24/20 0001      Lumbar Exercises: Stretches   Hip Flexor  Stretch Right;Left;2 reps;30 seconds      Lumbar Exercises: Standing   Other Standing Lumbar Exercises Pallof press with GTB x 15 each LE in front semi-tandem stance      Lumbar Exercises: Seated   LAQ on Ball Limitations 2x15 with ab set     Sit to Stand 10 reps    Other Seated Lumbar Exercises Posterior shoulder rolls x10 incorporating breathing to improve mobility through trunk    Other Seated Lumbar Exercises Row with GTB seated x10 VCs and demonstration for form      Lumbar Exercises: Supine   Bent Knee Raise 20 reps    Bridge 20 reps    Straight Leg Raise 10 reps   2 sets   Straight Leg Raises Limitations with ab set                    PT Short Term Goals - 04/03/20 0959      PT SHORT TERM GOAL #1  Title Patient will report understanding and regular compliance with HEP to improve strength, mobility and decrease pain.    Time 3    Period Weeks    Status On-going    Target Date 04/16/20      PT SHORT TERM GOAL #2   Title Patient will report improvement in overall subjective complaint of at least 25% for improved QOL.    Time 3    Period Weeks    Status On-going    Target Date 04/16/20             PT Long Term Goals - 04/03/20 0959      PT LONG TERM GOAL #1   Title Patient will report improvement in overall subjective complaint of at least 50% for improved QOL.    Time 6    Period Weeks    Status On-going      PT LONG TERM GOAL #2   Title Patient will demonstrate improvement of at least 1/2 MMT strength grade indicating improved strength for improved functional mobility.    Time 6    Period Weeks    Status On-going      PT LONG TERM GOAL #3   Title Patient will report that her pain has not radiated past her knee for at least 1 week indicating improved centralization of symptoms.    Time 6    Period Weeks    Status On-going      PT LONG TERM GOAL #4   Title Patient will report ability to walk for at least 30 minutes in order to go to grocery  store without issues.    Time 6    Period Weeks    Status On-going                 Plan - 04/24/20 1625    Clinical Impression Statement Focused on core strengthening exercises this session. Progressed to pallof press this session with patient well challenged with strength and balance in semi-tandem stance with this exercise. Added rows to improve posture and posterior shoulder rolls with breathing to improve overall mobility through the trunk and promote relaxation. Patient asks about using bicycle at home and therapist encouraged patient to slowly increase aerobic activity to tolerance.    Personal Factors and Comorbidities Comorbidity 3+    Comorbidities Fibromyalgia, HTN, history of falls, hiatal hernia, anxiety    Examination-Activity Limitations Stand;Locomotion Level;Bend;Transfers;Squat;Stairs    Examination-Participation Restrictions Cleaning;Meal Prep;Yard Work;Community Activity;Occupation;Laundry;Shop    Stability/Clinical Decision Making Evolving/Moderate complexity    Rehab Potential Fair    PT Frequency 2x / week    PT Duration 6 weeks    PT Treatment/Interventions ADLs/Self Care Home Management;Aquatic Therapy;Cryotherapy;Electrical Stimulation;Moist Heat;DME Instruction;Gait training;Stair training;Functional mobility training;Therapeutic activities;Therapeutic exercise;Balance training;Neuromuscular re-education;Patient/family education;Orthotic Fit/Training;Manual techniques;Passive range of motion;Dry needling;Energy conservation;Taping    PT Next Visit Plan Progress functional LE strengthening    PT Home Exercise Plan 03/26/20: Seated ab set; 04/03/20: Physioball ab set and hamstring set  04/09/20:  seated piriformis stretch 04/14/20: bridge, clam 04/21/20: thomas stretch; 04/24/20: cycling aerobic activity progression as able    Consulted and Agree with Plan of Care Patient           Patient will benefit from skilled therapeutic intervention in order to improve the  following deficits and impairments:  Improper body mechanics, Pain, Decreased mobility, Decreased activity tolerance, Decreased endurance, Decreased range of motion, Decreased strength, Difficulty walking, Hypomobility  Visit Diagnosis: Chronic bilateral low back pain with sciatica, sciatica laterality  unspecified  Left knee pain, unspecified chronicity  Right knee pain, unspecified chronicity  Muscle weakness (generalized)     Problem List Patient Active Problem List   Diagnosis Date Noted  . Essential hypertension 03/04/2020  . Morbid obesity ([place]) 03/04/2020  . Dyspnea on exertion 11/28/2019  . Abdominal pain 11/19/2016  . Change in bowel habits 11/19/2016  . Atypical chest pain 11/19/2016  . Bloating 11/19/2016  . Migraine   . Fibromyalgia    Clarene Critchley PT, DPT 4:27 PM, 04/24/20 [place] [address] [place], [place], [address] Phone: [phone]   Fax:  [phone]  Name: Hannah Short MRN: [id number] Date of Birth: 01/17/1956

## 2020-04-28 ENCOUNTER — Encounter (HOSPITAL_COMMUNITY): Payer: Self-pay | Admitting: Physical Therapy

## 2020-04-28 ENCOUNTER — Ambulatory Visit (HOSPITAL_COMMUNITY): Payer: BLUE CROSS/BLUE SHIELD | Admitting: Physical Therapy

## 2020-04-28 ENCOUNTER — Other Ambulatory Visit: Payer: Self-pay

## 2020-04-28 DIAGNOSIS — M25562 Pain in left knee: Secondary | ICD-10-CM | POA: Diagnosis not present

## 2020-04-28 DIAGNOSIS — M5440 Lumbago with sciatica, unspecified side: Secondary | ICD-10-CM

## 2020-04-28 DIAGNOSIS — M25561 Pain in right knee: Secondary | ICD-10-CM

## 2020-04-28 DIAGNOSIS — M6281 Muscle weakness (generalized): Secondary | ICD-10-CM

## 2020-04-28 DIAGNOSIS — G8929 Other chronic pain: Secondary | ICD-10-CM | POA: Diagnosis not present

## 2020-04-28 NOTE — Patient Instructions (Signed)
Access Code: [email or web address] URL: [email or web address] Date: 04/28/2020 Prepared by: Josue Hector  Exercises Supine Dead Bug with Leg Extension - 2 x daily - 7 x weekly - 2 sets - 10 reps Supine 90/90 Sciatic Nerve Glide with Knee Flexion/Extension - 2 x daily - 7 x weekly - 2 sets - 10 reps

## 2020-04-28 NOTE — Therapy (Signed)
[place], [place], [address] Phone: [phone]   Fax:  [phone]  Physical Therapy Treatment  Patient Details  Name: Hannah Short MRN: [id number] Date of Birth: 11/16/1955 Referring Provider (PT): Bevely Palmer Persons, [place]   Encounter Date: 04/28/2020   PT End of Session - 04/28/20 1129    Visit Number 7    Number of Visits 12    Date for PT Re-Evaluation 05/07/20    Authorization Type BCBS    Authorization - Visit Number 7    Authorization - Number of Visits 30    Progress Note Due on Visit 10    PT Start Time 1125    PT Stop Time 1205    PT Time Calculation (min) 40 min    Activity Tolerance Patient tolerated treatment well    Behavior During Therapy [facility] for tasks assessed/performed           Past Medical History:  Diagnosis Date  . Anemia   . Anxiety   . Arthritis   . Fibromyalgia   . GERD (gastroesophageal reflux disease)   . Heart murmur    mitral valve prolapse  . Hypertension   . Migraine   . Neck pain     Past Surgical History:  Procedure Laterality Date  . CESAREAN SECTION    . HERNIA REPAIR      There were no vitals filed for this visit.   Subjective Assessment - 04/28/20 1132    Subjective Patient says she is doing alright. Says her LT leg is hurting some today from doing a lot of standing yesterday.    How long can you stand comfortably? 5-10 minutes    How long can you walk comfortably? 5-10 minutes    Diagnostic tests X-ray, MRI lumbar and x-ray of knees    Patient Stated Goals Make it so her knees and back don't hurt    Currently in Pain? Yes    Pain Score 2     Pain Location Leg    Pain Orientation Left    Pain Descriptors / Indicators Burning    Pain Type Acute pain    Pain Onset --              [facility] PT Assessment - 04/28/20 0001      Observation/Other Assessments   Focus on Therapeutic Outcomes (FOTO)  25% limited    was 48% limited                          [facility] Adult PT Treatment/Exercise - 04/28/20 0001      Lumbar Exercises: Stretches   Hip Flexor Stretch Right;Left;2 reps;60 seconds    Piriformis Stretch Left;3 reps;30 seconds    Piriformis Stretch Limitations seated       Lumbar Exercises: Seated   Sit to Stand 15 reps      Lumbar Exercises: Supine   Bent Knee Raise 20 reps    Dead Bug 10 reps    Bridge 20 reps    Other Supine Lumbar Exercises sciatic nerve glide x15 each       Lumbar Exercises: Quadruped   Other Quadruped Lumbar Exercises hip extension x10 each     Other Quadruped Lumbar Exercises birddog x 5 (very challenging)                     PT Short Term Goals - 04/03/20 4163  PT SHORT TERM GOAL #1   Title Patient will report understanding and regular compliance with HEP to improve strength, mobility and decrease pain.    Time 3    Period Weeks    Status On-going    Target Date 04/16/20      PT SHORT TERM GOAL #2   Title Patient will report improvement in overall subjective complaint of at least 25% for improved QOL.    Time 3    Period Weeks    Status On-going    Target Date 04/16/20             PT Long Term Goals - 04/03/20 0959      PT LONG TERM GOAL #1   Title Patient will report improvement in overall subjective complaint of at least 50% for improved QOL.    Time 6    Period Weeks    Status On-going      PT LONG TERM GOAL #2   Title Patient will demonstrate improvement of at least 1/2 MMT strength grade indicating improved strength for improved functional mobility.    Time 6    Period Weeks    Status On-going      PT LONG TERM GOAL #3   Title Patient will report that her pain has not radiated past her knee for at least 1 week indicating improved centralization of symptoms.    Time 6    Period Weeks    Status On-going      PT LONG TERM GOAL #4   Title Patient will report ability to walk for at least 30 minutes in order to go to grocery store  without issues.    Time 6    Period Weeks    Status On-going                 Plan - 04/28/20 1247    Clinical Impression Statement Began session with FOTO survey update. Added sciatic nerve glides per patient subjective complaint of burning in LLE. Patient noted improvement with cues and demo for proper form. Continued working on core strengthening, as patient demos continued signs of decreased core control/ core weakness. Patient required verbal cues and demo for proper form with dead bugs with legs in elevated start position. Patient noted increased muscle fatigue with this. Patient also cued to avoid hip drop with quadruped hip extensions. Patient able to perform 5 reps of birddog progression in quadruped but was unable to complete set due to increased fatigue/ difficulty. Patient did note overall reduction in pain post treatment. Patient issued HEP handout for added nerve glide, and deadbug progressions.    Personal Factors and Comorbidities Comorbidity 3+    Comorbidities Fibromyalgia, HTN, history of falls, hiatal hernia, anxiety    Examination-Activity Limitations Stand;Locomotion Level;Bend;Transfers;Squat;Stairs    Examination-Participation Restrictions Cleaning;Meal Prep;Yard Work;Community Activity;Occupation;Laundry;Shop    Stability/Clinical Decision Making Evolving/Moderate complexity    Rehab Potential Fair    PT Frequency 2x / week    PT Duration 6 weeks    PT Treatment/Interventions ADLs/Self Care Home Management;Aquatic Therapy;Cryotherapy;Electrical Stimulation;Moist Heat;DME Instruction;Gait training;Stair training;Functional mobility training;Therapeutic activities;Therapeutic exercise;Balance training;Neuromuscular re-education;Patient/family education;Orthotic Fit/Training;Manual techniques;Passive range of motion;Dry needling;Energy conservation;Taping    PT Next Visit Plan Progress functional LE strengthening. Attempt birddogs again once core control improves    PT  Home Exercise Plan 03/26/20: Seated ab set; 04/03/20: Physioball ab set and hamstring set  04/09/20:  seated piriformis stretch 04/14/20: bridge, clam 04/21/20: thomas stretch; 04/24/20: cycling aerobic activity progression as able  04/28/20: deadbug, sciatic nerve glides    Consulted and Agree with Plan of Care Patient           Patient will benefit from skilled therapeutic intervention in order to improve the following deficits and impairments:  Improper body mechanics, Pain, Decreased mobility, Decreased activity tolerance, Decreased endurance, Decreased range of motion, Decreased strength, Difficulty walking, Hypomobility  Visit Diagnosis: Chronic bilateral low back pain with sciatica, sciatica laterality unspecified  Left knee pain, unspecified chronicity  Right knee pain, unspecified chronicity  Muscle weakness (generalized)     Problem List Patient Active Problem List   Diagnosis Date Noted  . Essential hypertension 03/04/2020  . Morbid obesity ([place]) 03/04/2020  . Dyspnea on exertion 11/28/2019  . Abdominal pain 11/19/2016  . Change in bowel habits 11/19/2016  . Atypical chest pain 11/19/2016  . Bloating 11/19/2016  . Migraine   . Fibromyalgia     12:54 PM, 04/28/20 Josue Hector PT DPT  Physical Therapist with [facility]  [phone] [place] [address] [place], [place], [address] Phone: [phone]   Fax:  [phone]  Name: Hannah Short MRN: [id number] Date of Birth: January 06, 1956

## 2020-04-29 ENCOUNTER — Ambulatory Visit (INDEPENDENT_AMBULATORY_CARE_PROVIDER_SITE_OTHER): Payer: BLUE CROSS/BLUE SHIELD | Admitting: Pharmacist Clinician (PhC)/ Clinical Pharmacy Specialist

## 2020-04-29 VITALS — BP 122/78 | HR 80 | Resp 16 | Ht 64.0 in | Wt 199.2 lb

## 2020-04-29 DIAGNOSIS — I10 Essential (primary) hypertension: Secondary | ICD-10-CM

## 2020-04-29 NOTE — Patient Instructions (Addendum)
Continue to monitor your blood pressure at home.  Continue with daily blood pressure checks and average the readings each week.   Your blood pressure today is 122/78  Check your blood pressure at home daily and keep record of the readings.  Take your BP meds as follows: amlodipine 5 mg daily  Bring all of your meds, your BP cuff and your record of home blood pressures to your next appointment.  Exercise as you're able, try to walk approximately 30 minutes per day.  Keep salt intake to a minimum, especially watch canned and prepared boxed foods.  Eat more fresh fruits and vegetables and fewer canned items.  Avoid eating in fast food restaurants.    HOW TO TAKE YOUR BLOOD PRESSURE: . Rest 5 minutes before taking your blood pressure. .  Don't smoke or drink caffeinated beverages for at least 30 minutes before. . Take your blood pressure before (not after) you eat. . Sit comfortably with your back supported and both feet on the floor (don't cross your legs). . Elevate your arm to heart level on a table or a desk. . Use the proper sized cuff. It should fit smoothly and snugly around your bare upper arm. There should be enough room to slip a fingertip under the cuff. The bottom edge of the cuff should be 1 inch above the crease of the elbow. . Ideally, take 3 measurements at one sitting and record the average.

## 2020-04-29 NOTE — Progress Notes (Signed)
HPI: Hannah Short is a 64 y.o. female with a history of hypertension, GERD and chronic knee and low back pain.  She was last seen by Dr. Gwenlyn Found on 03/04/20 for chest pain and dyspnea. She was found to be hypertensive and was started on amlodipine 5 mg daily and asked to keep a daily BP log. Chest pain has improved with the addition of omeprazole. She presents today for hypertension follow-up. Blood pressure is averaging 146/82 at home. She endorses medication adherence. She does frequently eat out on [place] foods, steak, and pastas. She notes some dizziness but attributes this to clonazepam that she has discontinued.  PMH: . HTN . GERD . Chronic pain  .  Medications: . Amlodipine 5 mg daily  . Fexofenadine 180 mg daily  . Gabapentin 300 mg BID . Omeprazole 40 mg daily . Dicyclomine 10 mg QID . Simethicone 125 mg  Daily  Allergies/Intolerances: . Sulfa, metronidazole, bethanidine, Topamax, clonazepam  Family History: Dad and grandmother both had HTN, 3 brothers are diabetic, 2 brothers are pre-diabetic, 1 brother has HTN  Social history: no smoking or alcohol, denies caffeine  Diet: eat out nearly every day, Chinese, steak, pastas, does not add additional salt onto foods, drinks decaf tea daily   Exercise: physical therapy twice a week, no other exercise  Vitals: . 04/29/20: BP 122/78, HR 80, height 5'4", weight 90.4 kg . 03/04/20: BP 150/96, HR 81, height 5'4", weight 88.1 kg  Labs: . 08/09/19: Na 143, K 3.7, Scr 0.73, GFR 89, Glu 91  Imaging: 12/18/19 coronary calcium score: 0   Home BP: 20 reading average 146/82  BP Goal: 130/80  Patient with hypertension currently on amlodipine 5 mg daily. Continue amlodipine and check BP at home every day. Follow-up in 6 weeks to reassess blood pressure and check home meter for discrepancies.   Hannah Short: PharmD Candidate, Hannah Short Class of 2022  I was with student and patient throughout entire visit and agree with above assessment and  plan.  Tommy Medal PharmD CPP [facility] at [employer]

## 2020-05-01 ENCOUNTER — Encounter (HOSPITAL_COMMUNITY): Payer: Self-pay | Admitting: Physical Therapy

## 2020-05-01 ENCOUNTER — Other Ambulatory Visit: Payer: Self-pay

## 2020-05-01 ENCOUNTER — Ambulatory Visit (HOSPITAL_COMMUNITY): Payer: BLUE CROSS/BLUE SHIELD | Admitting: Physical Therapy

## 2020-05-01 ENCOUNTER — Ambulatory Visit (INDEPENDENT_AMBULATORY_CARE_PROVIDER_SITE_OTHER): Payer: BLUE CROSS/BLUE SHIELD | Admitting: Physician Assistant

## 2020-05-01 ENCOUNTER — Encounter: Payer: Self-pay | Admitting: Orthopedic Surgery

## 2020-05-01 VITALS — Ht 64.0 in | Wt 199.0 lb

## 2020-05-01 DIAGNOSIS — M6281 Muscle weakness (generalized): Secondary | ICD-10-CM

## 2020-05-01 DIAGNOSIS — M25562 Pain in left knee: Secondary | ICD-10-CM

## 2020-05-01 DIAGNOSIS — G8929 Other chronic pain: Secondary | ICD-10-CM

## 2020-05-01 DIAGNOSIS — M5410 Radiculopathy, site unspecified: Secondary | ICD-10-CM

## 2020-05-01 DIAGNOSIS — M5440 Lumbago with sciatica, unspecified side: Secondary | ICD-10-CM | POA: Diagnosis not present

## 2020-05-01 DIAGNOSIS — M25561 Pain in right knee: Secondary | ICD-10-CM

## 2020-05-01 MED ORDER — PREDNISONE 10 MG PO TABS: 20.0000 mg | ORAL_TABLET | Freq: Every day | ORAL | 0 refills | Status: DC

## 2020-05-01 NOTE — Progress Notes (Signed)
Office Visit Note   Patient: Hannah Short           Date of Birth: 17-Apr-1956           MRN: [id number] Visit Date: 05/01/2020              Requested by: Lanelle Bal, PA-C [place],  [place] [address] PCP: Lanelle Bal, PA-C  Chief Complaint  Patient presents with  . Left Knee - Follow-up    S/p cortisone injection 03/10/20 bilateral knees   . Right Knee - Follow-up      HPI: Patient is here to follow-up on her lower back pain.  She has been through a course of steroids which helped well but then had a return of symptoms.  She did have an MRI which was reviewed and did not show any acute pathology.  She was seen by physical therapy and she is found this quite helpful.  She still has some pain that radiate out radiates down her right buttock into her lower extremity with occasional paresthesias.  She denies any weakness.  Assessment & Plan: Visit Diagnoses: No diagnosis found.  Plan: She would like to try 1 more course of steroids now that she been doing physical therapy and I am fine with this.  We once again reviewed how to wean down on the steroid.  Follow-up in 3 weeks.  Follow-Up Instructions: No follow-ups on file.   Ortho Exam  Patient is alert, oriented, no adenopathy, well-dressed, normal affect, normal respiratory effort. Examination her pain pattern traces down her right buttock down to the lateral side of her thigh.  She has some associated altered sensation.  She has excellent plantarflexion strength and dorsiflexion strength.  She has a mild straight leg raise.  Forward and backward bending does not change her symptoms dramatically no atrophy of her lower extremities Imaging: No results found. No images are attached to the encounter.  Labs: No results found for: HGBA1C, ESRSEDRATE, CRP, LABURIC, REPTSTATUS, GRAMSTAIN, CULT, LABORGA   No results found for: ALBUMIN, PREALBUMIN, LABURIC  No results found for: MG No results found for: VD25OH  No  results found for: PREALBUMIN No flowsheet data found.   Body mass index is 34.16 kg/m.  Orders:  No orders of the defined types were placed in this encounter.  Meds ordered this encounter  Medications  . predniSONE (DELTASONE) 10 MG tablet    Sig: Take 2 tablets (20 mg total) by mouth daily with breakfast.    Dispense:  60 tablet    Refill:  0     Procedures: No procedures performed  Clinical Data: No additional findings.  ROS:  All other systems negative, except as noted in the HPI. Review of Systems  Objective: Vital Signs: Ht 5\' 4"  (1.626 m)   Wt 199 lb (90.3 kg)   BMI 34.16 kg/m   Specialty Comments:  No specialty comments available.  PMFS History: Patient Active Problem List   Diagnosis Date Noted  . Essential hypertension 03/04/2020  . Morbid obesity ([place]) 03/04/2020  . Dyspnea on exertion 11/28/2019  . Abdominal pain 11/19/2016  . Change in bowel habits 11/19/2016  . Atypical chest pain 11/19/2016  . Bloating 11/19/2016  . Migraine   . Fibromyalgia    Past Medical History:  Diagnosis Date  . Anemia   . Anxiety   . Arthritis   . Fibromyalgia   . GERD (gastroesophageal reflux disease)   . Heart murmur    mitral  valve prolapse  . Hypertension   . Migraine   . Neck pain     Family History  Problem Relation Age of Onset  . Heart failure Mother   . Heart failure Father     Past Surgical History:  Procedure Laterality Date  . CESAREAN SECTION    . HERNIA REPAIR     Social History   Occupational History  . Occupation: employed  Tobacco Use  . Smoking status: Never Smoker  . Smokeless tobacco: Never Used  Substance and Sexual Activity  . Alcohol use: No  . Drug use: No  . Sexual activity: Not on file

## 2020-05-01 NOTE — Therapy (Signed)
[place], [place], [address] Phone: [phone]   Fax:  [phone]  Physical Therapy Treatment  Patient Details  Name: Hannah Short MRN: [id number] Date of Birth: 23-Mar-1956 Referring Provider (PT): Bevely Palmer Persons, [place]   Encounter Date: 05/01/2020   PT End of Session - 05/01/20 1304    Visit Number 8    Number of Visits 12    Date for PT Re-Evaluation 05/07/20    Authorization Type BCBS    Authorization - Visit Number 8    Authorization - Number of Visits 30    Progress Note Due on Visit 10    PT Start Time 1301    PT Stop Time 1339    PT Time Calculation (min) 38 min    Activity Tolerance Patient tolerated treatment well    Behavior During Therapy [facility] for tasks assessed/performed           Past Medical History:  Diagnosis Date  . Anemia   . Anxiety   . Arthritis   . Fibromyalgia   . GERD (gastroesophageal reflux disease)   . Heart murmur    mitral valve prolapse  . Hypertension   . Migraine   . Neck pain     Past Surgical History:  Procedure Laterality Date  . CESAREAN SECTION    . HERNIA REPAIR      There were no vitals filed for this visit.   Subjective Assessment - 05/01/20 1304    Subjective Patient reported that her sciatic nerve in her LT leg is painful.    How long can you stand comfortably? 5-10 minutes    How long can you walk comfortably? 5-10 minutes    Diagnostic tests X-ray, MRI lumbar and x-ray of knees    Patient Stated Goals Make it so her knees and back don't hurt    Currently in Pain? Yes    Pain Score 8     Pain Location Leg    Pain Orientation Left    Pain Descriptors / Indicators Burning    Pain Type Acute pain                             [facility] Adult PT Treatment/Exercise - 05/01/20 0001      Lumbar Exercises: Stretches   Hip Flexor Stretch Right;Left;2 reps;60 seconds      Lumbar Exercises: Seated   Other Seated Lumbar Exercises  Ab set with marching while seated on blue physioball x 20 alternating LEs      Lumbar Exercises: Supine   Other Supine Lumbar Exercises sciatic nerve glide x15 each with towel behind knee      Lumbar Exercises: Quadruped   Single Arm Raise Right;Left    Single Arm Raises Limitations 2x5 cues for proper alignment    Other Quadruped Lumbar Exercises hip extension 2x5 each                    PT Short Term Goals - 04/03/20 0959      PT SHORT TERM GOAL #1   Title Patient will report understanding and regular compliance with HEP to improve strength, mobility and decrease pain.    Time 3    Period Weeks    Status On-going    Target Date 04/16/20      PT SHORT TERM GOAL #2   Title Patient will report improvement in overall subjective complaint of  at least 25% for improved QOL.    Time 3    Period Weeks    Status On-going    Target Date 04/16/20             PT Long Term Goals - 04/03/20 0959      PT LONG TERM GOAL #1   Title Patient will report improvement in overall subjective complaint of at least 50% for improved QOL.    Time 6    Period Weeks    Status On-going      PT LONG TERM GOAL #2   Title Patient will demonstrate improvement of at least 1/2 MMT strength grade indicating improved strength for improved functional mobility.    Time 6    Period Weeks    Status On-going      PT LONG TERM GOAL #3   Title Patient will report that her pain has not radiated past her knee for at least 1 week indicating improved centralization of symptoms.    Time 6    Period Weeks    Status On-going      PT LONG TERM GOAL #4   Title Patient will report ability to walk for at least 30 minutes in order to go to grocery store without issues.    Time 6    Period Weeks    Status On-going                 Plan - 05/01/20 1349    Clinical Impression Statement Continued with progression of core strengthening exercises this session. Added upper extremity arm lifts in  quadruped with core activation this session in conjunction with lower extremity leg lifts in quadruped. Also added seated marching on physioball this session with abdominal activation. Patient did require upper extremity assistance to maintain balance with this and required cueing for proper core engagement. Therapist discussed with patient beginning to go to the gym as tolerated modifying as needed based on her pain level. Patient reported feeling good at the end of the session and stated that she felt that  she was "walking on springs".    Personal Factors and Comorbidities Comorbidity 3+    Comorbidities Fibromyalgia, HTN, history of falls, hiatal hernia, anxiety    Examination-Activity Limitations Stand;Locomotion Level;Bend;Transfers;Squat;Stairs    Examination-Participation Restrictions Cleaning;Meal Prep;Yard Work;Community Activity;Occupation;Laundry;Shop    Stability/Clinical Decision Making Evolving/Moderate complexity    Rehab Potential Fair    PT Frequency 2x / week    PT Duration 6 weeks    PT Treatment/Interventions ADLs/Self Care Home Management;Aquatic Therapy;Cryotherapy;Electrical Stimulation;Moist Heat;DME Instruction;Gait training;Stair training;Functional mobility training;Therapeutic activities;Therapeutic exercise;Balance training;Neuromuscular re-education;Patient/family education;Orthotic Fit/Training;Manual techniques;Passive range of motion;Dry needling;Energy conservation;Taping    PT Next Visit Plan Progress functional LE strengthening and core strengthening, consider adding PNF strengthening with core activation. Attempt birddogs again once core control improves    PT Home Exercise Plan 03/26/20: Seated ab set; 04/03/20: Physioball ab set and hamstring set  04/09/20:  seated piriformis stretch 04/14/20: bridge, clam 04/21/20: thomas stretch; 04/24/20: cycling aerobic activity progression as able 04/28/20: deadbug, sciatic nerve glides    Consulted and Agree with Plan of Care Patient            Patient will benefit from skilled therapeutic intervention in order to improve the following deficits and impairments:  Improper body mechanics, Pain, Decreased mobility, Decreased activity tolerance, Decreased endurance, Decreased range of motion, Decreased strength, Difficulty walking, Hypomobility  Visit Diagnosis: Chronic bilateral low back pain with sciatica, sciatica laterality unspecified  Left  knee pain, unspecified chronicity  Right knee pain, unspecified chronicity  Muscle weakness (generalized)     Problem List Patient Active Problem List   Diagnosis Date Noted  . Essential hypertension 03/04/2020  . Morbid obesity ([place]) 03/04/2020  . Dyspnea on exertion 11/28/2019  . Abdominal pain 11/19/2016  . Change in bowel habits 11/19/2016  . Atypical chest pain 11/19/2016  . Bloating 11/19/2016  . Migraine   . Fibromyalgia    Clarene Critchley PT, DPT 1:52 PM, 05/01/20 [place] [address] [place], [place], [address] Phone: [phone]   Fax:  [phone]  Name: ZABRINA BROTHERTON MRN: [id number] Date of Birth: 23-May-1956

## 2020-05-05 ENCOUNTER — Ambulatory Visit (HOSPITAL_COMMUNITY): Payer: BLUE CROSS/BLUE SHIELD | Attending: Physician Assistant | Admitting: Physical Therapy

## 2020-05-05 ENCOUNTER — Other Ambulatory Visit: Payer: Self-pay

## 2020-05-05 ENCOUNTER — Encounter (HOSPITAL_COMMUNITY): Payer: Self-pay | Admitting: Physical Therapy

## 2020-05-05 DIAGNOSIS — M25561 Pain in right knee: Secondary | ICD-10-CM | POA: Diagnosis not present

## 2020-05-05 DIAGNOSIS — M5440 Lumbago with sciatica, unspecified side: Secondary | ICD-10-CM | POA: Diagnosis not present

## 2020-05-05 DIAGNOSIS — M25562 Pain in left knee: Secondary | ICD-10-CM

## 2020-05-05 DIAGNOSIS — G8929 Other chronic pain: Secondary | ICD-10-CM | POA: Insufficient documentation

## 2020-05-05 DIAGNOSIS — M6281 Muscle weakness (generalized): Secondary | ICD-10-CM | POA: Diagnosis not present

## 2020-05-05 NOTE — Patient Instructions (Signed)
Access Code: [email or web address] URL: [email or web address] Date: 05/05/2020 Prepared by: Josue Hector  Exercises Supine Transversus Abdominis Bracing - Hands on Stomach - 1 x daily - 4 x weekly - 1 sets - 10 reps - 5 sec hold Supine March - 1 x daily - 4 x weekly - 2 sets - 10 reps Supine Dead Bug with Leg Extension - 1 x daily - 4 x weekly - 2 sets - 10 reps Supine Bridge - 1 x daily - 4 x weekly - 2 sets - 10 reps - 5 sec hold Bird Dog - 1 x daily - 4 x weekly - 2 sets - 10 reps Clamshell - 1 x daily - 4 x weekly - 2 sets - 10 reps Supine 90/90 Sciatic Nerve Glide with Knee Flexion/Extension - 1 x daily - 4 x weekly - 2 sets - 10 reps Modified Thomas Stretch - 1 x daily - 4 x weekly - 1 sets - 3 reps - 30 sec hold Sit to Stand without Arm Support - 1 x daily - 4 x weekly - 2 sets - 10 reps

## 2020-05-05 NOTE — Therapy (Signed)
[facility] [address] [place], [place], [address] Phone: [phone]   Fax:  [phone]  Physical Therapy Treatment  Patient Details  Name: Hannah Short MRN: [id number] Date of Birth: 1956/05/31 Referring Provider (PT): Bevely Palmer Persons, PA  PHYSICAL THERAPY DISCHARGE SUMMARY  Visits from Start of Care: 9  Current functional level related to goals / functional outcomes: See below    Remaining deficits: See below    Education / Equipment: See assessment  Plan: Patient agrees to discharge.  Patient goals were partially met. Patient is being discharged due to being pleased with the current functional level.  ?????       Encounter Date: 05/05/2020   PT End of Session - 05/05/20 1215    Visit Number 9    Number of Visits 12    Date for PT Re-Evaluation 05/07/20    Authorization Type BCBS    Authorization - Visit Number 9    Authorization - Number of Visits 30    Progress Note Due on Visit 10    PT Start Time 1120    PT Stop Time 1200    PT Time Calculation (min) 40 min    Activity Tolerance Patient tolerated treatment well    Behavior During Therapy WFL for tasks assessed/performed           Past Medical History:  Diagnosis Date  . Anemia   . Anxiety   . Arthritis   . Fibromyalgia   . GERD (gastroesophageal reflux disease)   . Heart murmur    mitral valve prolapse  . Hypertension   . Migraine   . Neck pain     Past Surgical History:  Procedure Laterality Date  . CESAREAN SECTION    . HERNIA REPAIR      There were no vitals filed for this visit.   Subjective Assessment - 05/05/20 1126    Subjective Patient says she is in a good place today. Patient says she recently went back on prednisone which has been helping with LT leg pain. Patient reports 92% improvement since starting therapy. Patient says she feels stronger and is able to do daily functions better, but still has some issues with her LT leg  and low back. Says she has good days and bad days.    Limitations Standing;Walking    How long can you stand comfortably? --    How long can you walk comfortably? --    Diagnostic tests X-ray, MRI lumbar and x-ray of knees    Patient Stated Goals Make it so her knees and back don't hurt    Currently in Pain? No/denies              [facility] PT Assessment - 05/05/20 0001      Assessment   Medical Diagnosis Chronic Left-sided LBP with left-sided sciatica, bilateral knee pain    Referring Provider (PT) Bevely Palmer Persons, PA    Prior Therapy Yes, on knees      Precautions   Precautions None      Restrictions   Weight Bearing Restrictions No      Home Environment   Living Environment Private residence    Living Arrangements Spouse/significant other      Prior Function   Level of Independence Independent    Vocation Full time employment      Cognition   Overall Cognitive Status Within Functional Limits for tasks assessed      Observation/Other Assessments  Focus on Therapeutic Outcomes (FOTO)  25% limited       Strength   Right Hip Flexion 4+/5   was 4   Right Hip Extension 4/5   was 2+   Right Hip ABduction 4+/5   was 4   Left Hip Flexion 4/5   was 4-   Left Hip Extension 4/5   was 2+   Left Hip ABduction 4/5    Right Knee Flexion 5/5    Right Knee Extension 5/5    Left Knee Flexion 5/5    Left Knee Extension 5/5    Right Ankle Dorsiflexion 5/5    Left Ankle Dorsiflexion 5/5                                 PT Education - 05/05/20 1217    Education Details on transition to comprehensive HEP for DC.    Person(s) Educated Patient    Methods Explanation;Handout    Comprehension Verbalized understanding            PT Short Term Goals - 05/05/20 1131      PT SHORT TERM GOAL #1   Title Patient will report understanding and regular compliance with HEP to improve strength, mobility and decrease pain.    Baseline Reports compliance    Time 3     Period Weeks    Status Achieved    Target Date 04/16/20      PT SHORT TERM GOAL #2   Title Patient will report improvement in overall subjective complaint of at least 25% for improved QOL.    Baseline Reports 92% improvement    Time 3    Period Weeks    Status Achieved    Target Date 04/16/20             PT Long Term Goals - 05/05/20 1132      PT LONG TERM GOAL #1   Title Patient will report improvement in overall subjective complaint of at least 50% for improved QOL.    Baseline Reports 92% improvement    Time 6    Period Weeks    Status Achieved      PT LONG TERM GOAL #2   Title Patient will demonstrate improvement of at least 1/2 MMT strength grade indicating improved strength for improved functional mobility.    Baseline See MMT (all improved except LT hip abduction)    Time 6    Period Weeks    Status Partially Met      PT LONG TERM GOAL #3   Title Patient will report that her pain has not radiated past her knee for at least 1 week indicating improved centralization of symptoms.    Baseline Reports ongoing LLE pain but not past knee, isolated to thigh area    Time 6    Period Weeks    Status Achieved      PT LONG TERM GOAL #4   Title Patient will report ability to walk for at least 30 minutes in order to go to grocery store without issues.    Baseline Reports she can do this with little issue on a "good day"    Time 6    Period Weeks    Status Partially Met                 Plan - 05/05/20 1215    Clinical Impression Statement Patient has made good progress  and has currently met/ partially met all therapy goals. Patient still limited by mild weakness in LT hip, and intermittent LLE thigh pain. Patient is currently doing much better having recent steroid dose pack and reports significant improvement in overall subjective complaint. Patient reports 92% improvement in overall function and discussed transitioning to HEP for DC today. Patient says she has a  family member that is a Physiological scientist who she can work with on progressing strength exercises from therapy, now that she has reached a good functional level. Patient educated on and issued updated HEP handout. Patient instructed to follow up with therapy services with any further questions or concerns.    Personal Factors and Comorbidities Comorbidity 3+    Comorbidities Fibromyalgia, HTN, history of falls, hiatal hernia, anxiety    Examination-Activity Limitations Stand;Locomotion Level;Bend;Transfers;Squat;Stairs    Examination-Participation Restrictions Cleaning;Meal Prep;Yard Work;Community Activity;Occupation;Laundry;Shop    Stability/Clinical Decision Making Evolving/Moderate complexity    Rehab Potential Fair    PT Treatment/Interventions ADLs/Self Care Home Management;Aquatic Therapy;Cryotherapy;Electrical Stimulation;Moist Heat;DME Instruction;Gait training;Stair training;Functional mobility training;Therapeutic activities;Therapeutic exercise;Balance training;Neuromuscular re-education;Patient/family education;Orthotic Fit/Training;Manual techniques;Passive range of motion;Dry needling;Energy conservation;Taping    PT Next Visit Plan DC to HEP    PT Home Exercise Plan 03/26/20: Seated ab set; 04/03/20: Physioball ab set and hamstring set  04/09/20:  seated piriformis stretch 04/14/20: bridge, clam 04/21/20: thomas stretch; 04/24/20: cycling aerobic activity progression as able 04/28/20: deadbug, sciatic nerve glides    Consulted and Agree with Plan of Care Patient           Patient will benefit from skilled therapeutic intervention in order to improve the following deficits and impairments:  Improper body mechanics, Pain, Decreased mobility, Decreased activity tolerance, Decreased endurance, Decreased range of motion, Decreased strength, Difficulty walking, Hypomobility  Visit Diagnosis: Chronic bilateral low back pain with sciatica, sciatica laterality unspecified  Left knee pain,  unspecified chronicity  Right knee pain, unspecified chronicity  Muscle weakness (generalized)     Problem List Patient Active Problem List   Diagnosis Date Noted  . Essential hypertension 03/04/2020  . Morbid obesity ([place]) 03/04/2020  . Dyspnea on exertion 11/28/2019  . Abdominal pain 11/19/2016  . Change in bowel habits 11/19/2016  . Atypical chest pain 11/19/2016  . Bloating 11/19/2016  . Migraine   . Fibromyalgia    12:18 PM, 05/05/20 Josue Hector PT DPT  Physical Therapist with [facility]  [phone] [place] [address] [place], [place], [address] Phone: [phone]   Fax:  [phone]  Name: MARGAREE SANDHU MRN: [id number] Date of Birth: 02-Dec-1955

## 2020-05-08 ENCOUNTER — Encounter (HOSPITAL_COMMUNITY): Payer: BLUE CROSS/BLUE SHIELD | Admitting: Physical Therapy

## 2020-05-08 DIAGNOSIS — E559 Vitamin D deficiency, unspecified: Secondary | ICD-10-CM | POA: Diagnosis not present

## 2020-05-08 DIAGNOSIS — M797 Fibromyalgia: Secondary | ICD-10-CM | POA: Diagnosis not present

## 2020-05-08 DIAGNOSIS — G4700 Insomnia, unspecified: Secondary | ICD-10-CM | POA: Diagnosis not present

## 2020-05-08 DIAGNOSIS — R42 Dizziness and giddiness: Secondary | ICD-10-CM | POA: Diagnosis not present

## 2020-05-09 ENCOUNTER — Encounter (HOSPITAL_COMMUNITY): Payer: Self-pay | Admitting: Physical Therapy

## 2020-05-12 ENCOUNTER — Encounter (HOSPITAL_COMMUNITY): Payer: BLUE CROSS/BLUE SHIELD | Admitting: Physical Therapy

## 2020-05-28 ENCOUNTER — Telehealth: Payer: Self-pay | Admitting: Orthopedic Surgery

## 2020-05-28 ENCOUNTER — Other Ambulatory Visit: Payer: Self-pay | Admitting: Physician Assistant

## 2020-05-28 DIAGNOSIS — Z6833 Body mass index (BMI) 33.0-33.9, adult: Secondary | ICD-10-CM | POA: Diagnosis not present

## 2020-05-28 DIAGNOSIS — K219 Gastro-esophageal reflux disease without esophagitis: Secondary | ICD-10-CM | POA: Diagnosis not present

## 2020-05-28 DIAGNOSIS — R079 Chest pain, unspecified: Secondary | ICD-10-CM | POA: Diagnosis not present

## 2020-05-28 MED ORDER — PREDNISONE 10 MG PO TABS: 10.0000 mg | ORAL_TABLET | Freq: Every day | ORAL | 0 refills | Status: DC

## 2020-05-28 NOTE — Telephone Encounter (Signed)
I did receive her a refill for 20 prednisone because I do not want her to stop it abruptly however she was due to follow-up and has not followed up so she will need to make an appointment

## 2020-05-28 NOTE — Telephone Encounter (Signed)
Pt has follow up 06/16/20. I called pt to advise rx had been sent to pharm.

## 2020-05-28 NOTE — Telephone Encounter (Signed)
Please see below and advise.

## 2020-05-28 NOTE — Telephone Encounter (Signed)
Patient called needing Rx refilled Prednisone. The number to contact patient is [phone]

## 2020-05-30 DIAGNOSIS — K8020 Calculus of gallbladder without cholecystitis without obstruction: Secondary | ICD-10-CM | POA: Diagnosis not present

## 2020-05-30 DIAGNOSIS — R1012 Left upper quadrant pain: Secondary | ICD-10-CM | POA: Diagnosis not present

## 2020-05-30 DIAGNOSIS — M549 Dorsalgia, unspecified: Secondary | ICD-10-CM | POA: Diagnosis not present

## 2020-06-05 ENCOUNTER — Ambulatory Visit: Payer: BLUE CROSS/BLUE SHIELD | Admitting: Orthopedic Surgery

## 2020-06-12 ENCOUNTER — Ambulatory Visit: Payer: BLUE CROSS/BLUE SHIELD

## 2020-06-16 ENCOUNTER — Ambulatory Visit (INDEPENDENT_AMBULATORY_CARE_PROVIDER_SITE_OTHER): Payer: BLUE CROSS/BLUE SHIELD | Admitting: Orthopedic Surgery

## 2020-06-16 ENCOUNTER — Encounter: Payer: Self-pay | Admitting: Orthopedic Surgery

## 2020-06-16 DIAGNOSIS — M5410 Radiculopathy, site unspecified: Secondary | ICD-10-CM | POA: Diagnosis not present

## 2020-06-16 NOTE — Progress Notes (Signed)
Office Visit Note   Patient: Hannah Short           Date of Birth: August 06, 1955           MRN: [id number] Visit Date: 06/16/2020              Requested by: Lanelle Bal, PA-C [place],  [place] [address] PCP: Lanelle Bal, PA-C  Chief Complaint  Patient presents with  . Lower Back - Pain      HPI: Patient is a 64 year old woman who complains of bilateral radicular pain worse on the right than the left she states she has burning pain that radiates from her buttock down to the lateral aspect of her foot.  She states she previously had right-sided pain but now has it on both sides.  Patient in the past has had epidural steroid injections with Dr. Herma Mering she states that her last injection did not help.  Recently she was started on prednisone she states that this did help her radicular back pain but she states she is weaned off it secondary to her undergoing treatment for a GI problem.  Patient states she just obtained orthotics from [facility].  Assessment & Plan: Visit Diagnoses:  1. Radicular low back pain     Plan: We will request a MRI scan of her lumbar spine to see if a epidural steroid injection could be beneficial for her worsening radicular pain.  Recommended she could increase her Neurontin from 300 mg twice a day to 300 mg 3 times a day to try to help with her radicular symptoms she currently takes this for migraines.  Follow-Up Instructions: Return if symptoms worsen or fail to improve, for Patient will call to follow-up after MRI scan.   Ortho Exam  Patient is alert, oriented, no adenopathy, well-dressed, normal affect, normal respiratory effort. Examination patient has a normal gait she has a positive straight leg raise bilaterally.  She has good motor strength in both lower extremities with good strength with EHL plantar flexion and dorsiflexion of the ankle.  Previous MRI scan in February showed no disc herniation or stenosis.  Imaging: No results found. No  images are attached to the encounter.  Labs: No results found for: HGBA1C, ESRSEDRATE, CRP, LABURIC, REPTSTATUS, GRAMSTAIN, CULT, LABORGA   No results found for: ALBUMIN, PREALBUMIN, LABURIC  No results found for: MG No results found for: VD25OH  No results found for: PREALBUMIN No flowsheet data found.   There is no height or weight on file to calculate BMI.  Orders:  Orders Placed This Encounter  Procedures  . MR Lumbar Spine w/o contrast   No orders of the defined types were placed in this encounter.    Procedures: No procedures performed  Clinical Data: No additional findings.  ROS:  All other systems negative, except as noted in the HPI. Review of Systems  Objective: Vital Signs: There were no vitals taken for this visit.  Specialty Comments:  No specialty comments available.  PMFS History: Patient Active Problem List   Diagnosis Date Noted  . Essential hypertension 03/04/2020  . Morbid obesity ([place]) 03/04/2020  . Dyspnea on exertion 11/28/2019  . Abdominal pain 11/19/2016  . Change in bowel habits 11/19/2016  . Atypical chest pain 11/19/2016  . Bloating 11/19/2016  . Migraine   . Fibromyalgia    Past Medical History:  Diagnosis Date  . Anemia   . Anxiety   . Arthritis   . Fibromyalgia   .  GERD (gastroesophageal reflux disease)   . Heart murmur    mitral valve prolapse  . Hypertension   . Migraine   . Neck pain     Family History  Problem Relation Age of Onset  . Heart failure Mother   . Heart failure Father     Past Surgical History:  Procedure Laterality Date  . CESAREAN SECTION    . HERNIA REPAIR     Social History   Occupational History  . Occupation: employed  Tobacco Use  . Smoking status: Never Smoker  . Smokeless tobacco: Never Used  Substance and Sexual Activity  . Alcohol use: No  . Drug use: No  . Sexual activity: Not on file

## 2020-07-02 DIAGNOSIS — G43719 Chronic migraine without aura, intractable, without status migrainosus: Secondary | ICD-10-CM | POA: Diagnosis not present

## 2020-07-03 ENCOUNTER — Ambulatory Visit: Payer: BLUE CROSS/BLUE SHIELD

## 2020-07-06 ENCOUNTER — Ambulatory Visit
Admission: RE | Admit: 2020-07-06 | Discharge: 2020-07-06 | Disposition: A | Discharge status: Home / Self Care | Payer: BLUE CROSS/BLUE SHIELD | Source: Ambulatory Visit | Attending: Orthopedic Surgery | Admitting: Orthopedic Surgery

## 2020-07-06 ENCOUNTER — Other Ambulatory Visit: Payer: Self-pay

## 2020-07-06 DIAGNOSIS — M5116 Intervertebral disc disorders with radiculopathy, lumbar region: Secondary | ICD-10-CM | POA: Diagnosis not present

## 2020-07-06 DIAGNOSIS — M5410 Radiculopathy, site unspecified: Secondary | ICD-10-CM

## 2020-07-06 DIAGNOSIS — M4726 Other spondylosis with radiculopathy, lumbar region: Secondary | ICD-10-CM | POA: Diagnosis not present

## 2020-07-06 IMAGING — MR MR LUMBAR SPINE W/O CM
4 of 5 series · 20 of 48 positions shown · non-contrast
Comparison: None.

CLINICAL DATA: Lumbar radiculopathy

EXAM:
MRI LUMBAR SPINE WITHOUT CONTRAST
TECHNIQUE: Multiplanar, multisequence MR imaging of the lumbar spine was
performed. No intravenous contrast was administered.

[Series 5: T2 · sagittal · 4.0mm · 0.73mm/px · 6 of 15 slices shown (1 of 2)]
[im 1/15]
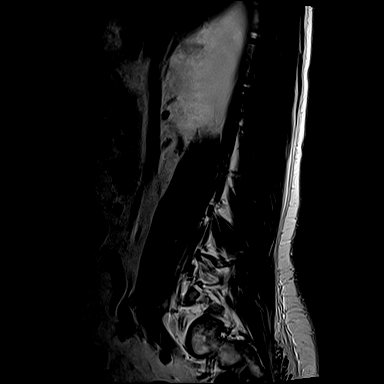
[im 3/15]
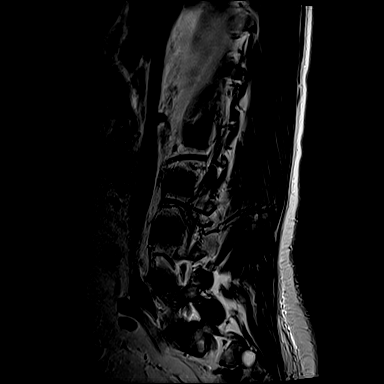
[im 6/15]
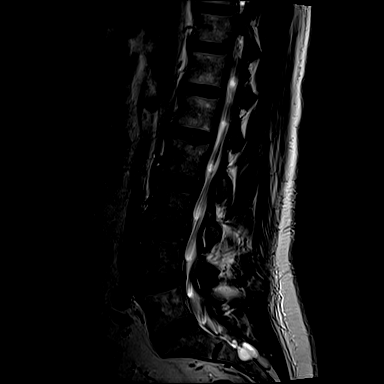
[im 9/15]
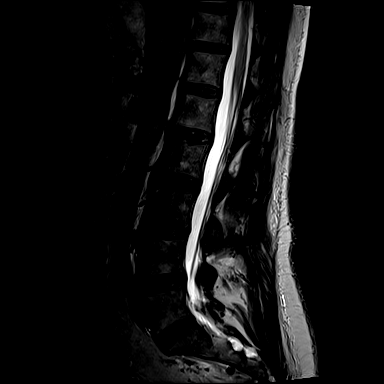
[im 12/15]
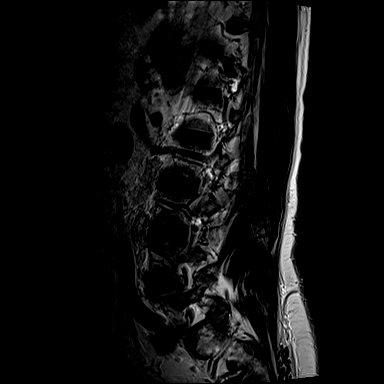
[im 15/15]
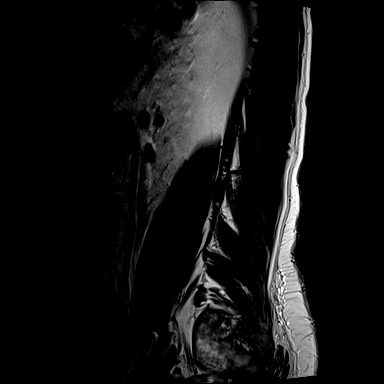

[Series 6: T1 · sagittal · 4.0mm · 0.73mm/px · 3 of 15 slices shown (1 of 2)]
[im 1/15]
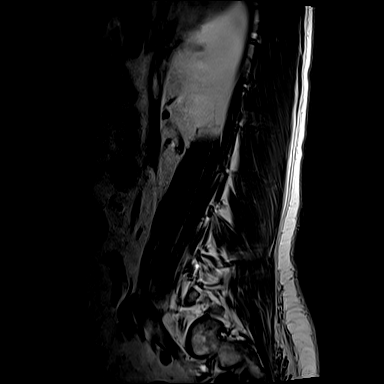
[im 8/15]
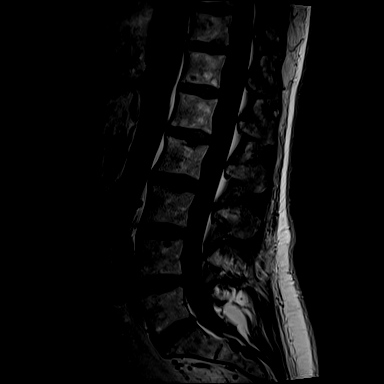
[im 15/15]
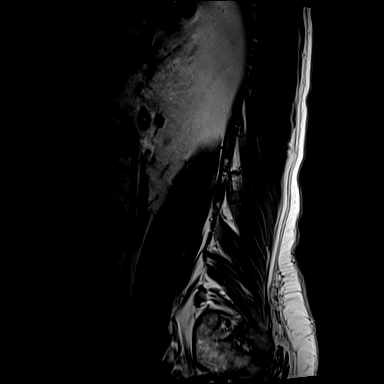

[Series 10: T1 · axial · 4.0mm · 0.35mm/px · z∈[-44,+119]mm · 3 of 44 slices shown (2 of 2)]
[im 6/44]
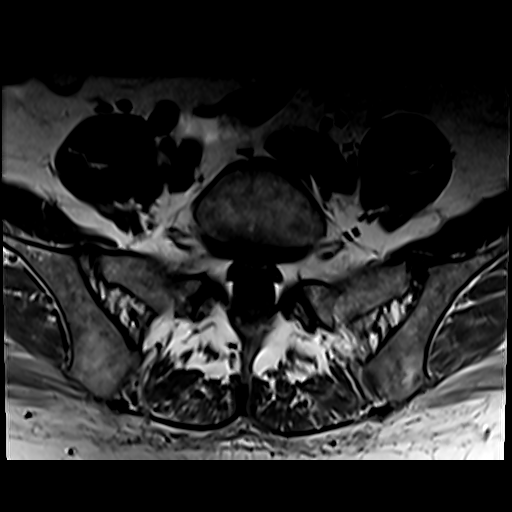
[im 23/44]
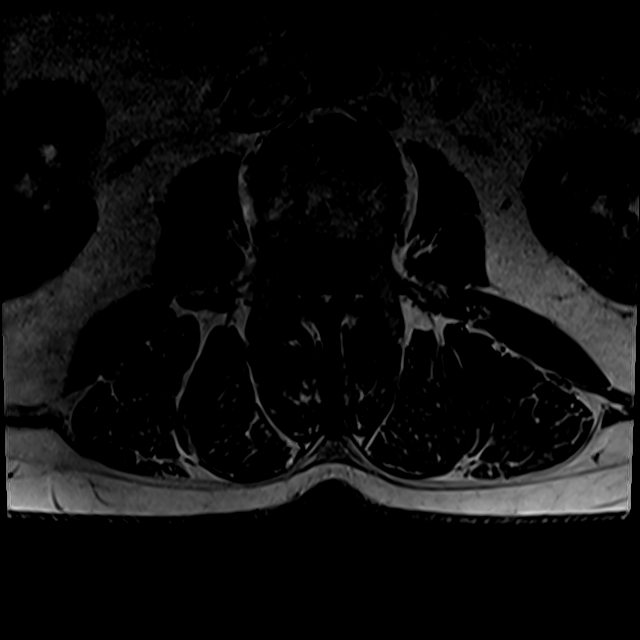
[im 38/44]
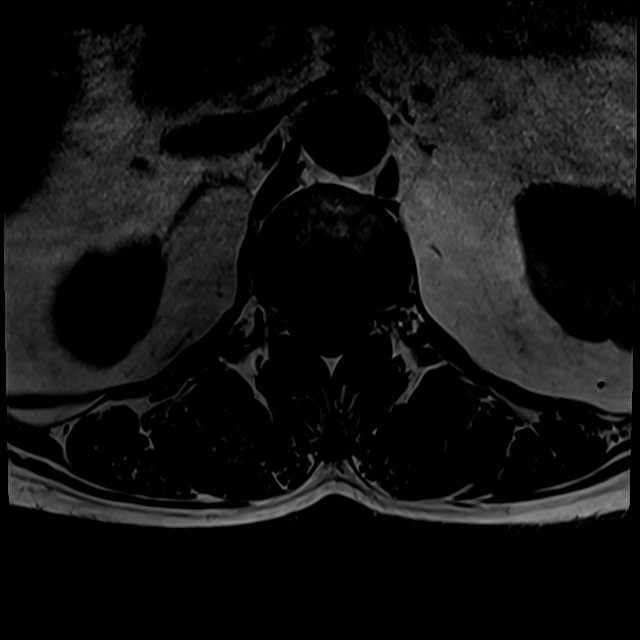

[Series 13: T2 · axial · 4.0mm · 0.35mm/px · z∈[-59,+119]mm · 8 of 44 slices shown (2 of 2)]
[im 3/44]
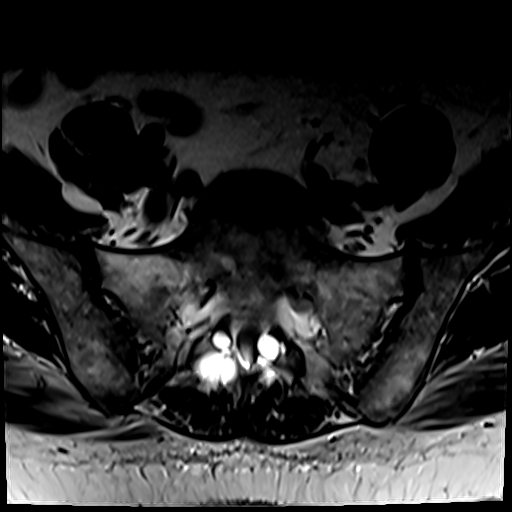
[im 6/44]
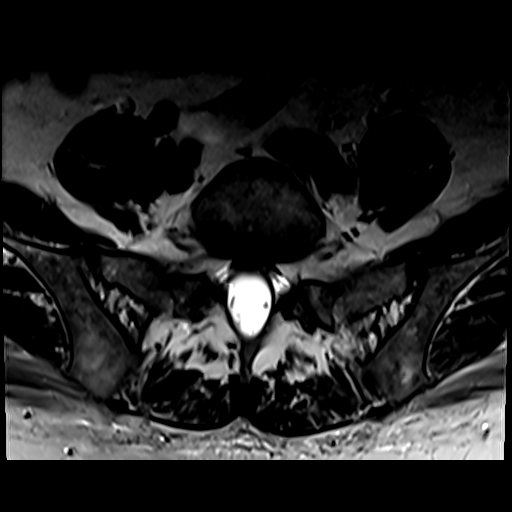
[im 9/44]
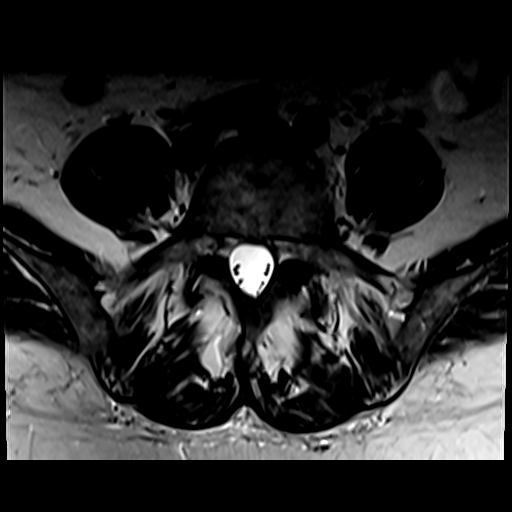
[im 15/44]
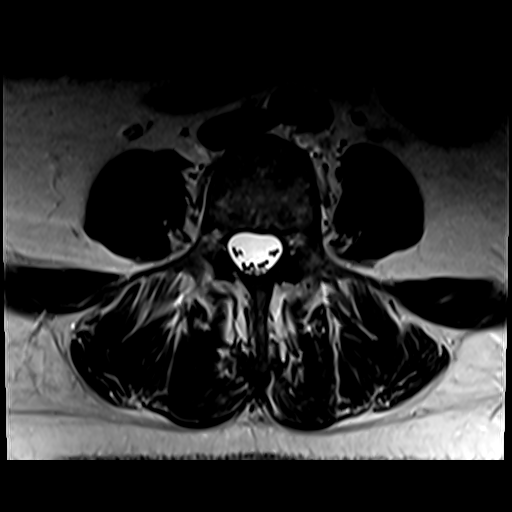
[im 21/44]
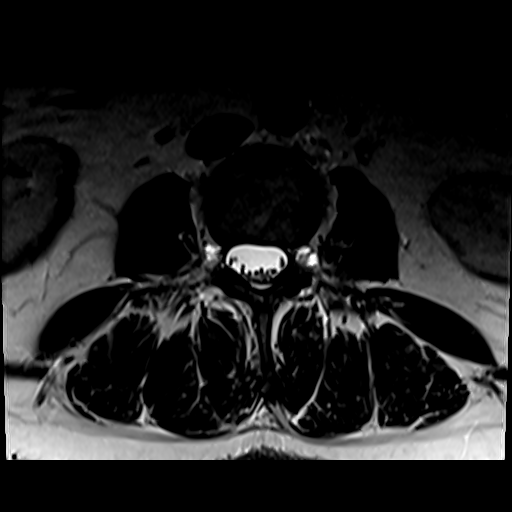
[im 23/44]
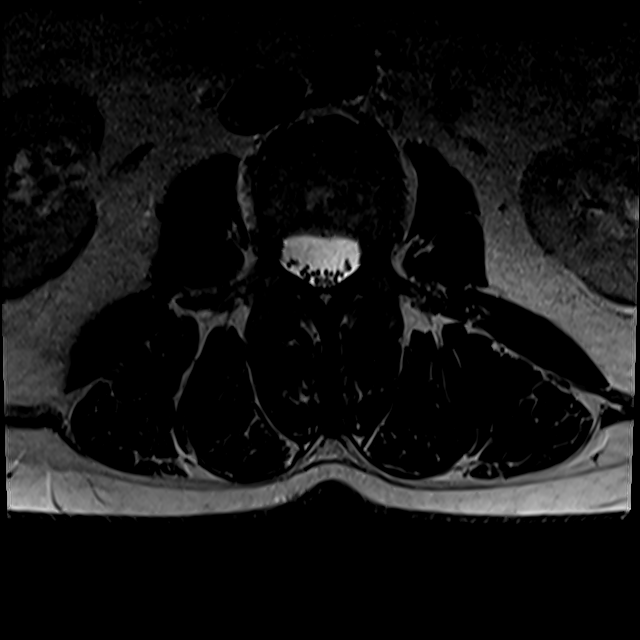
[im 26/44]
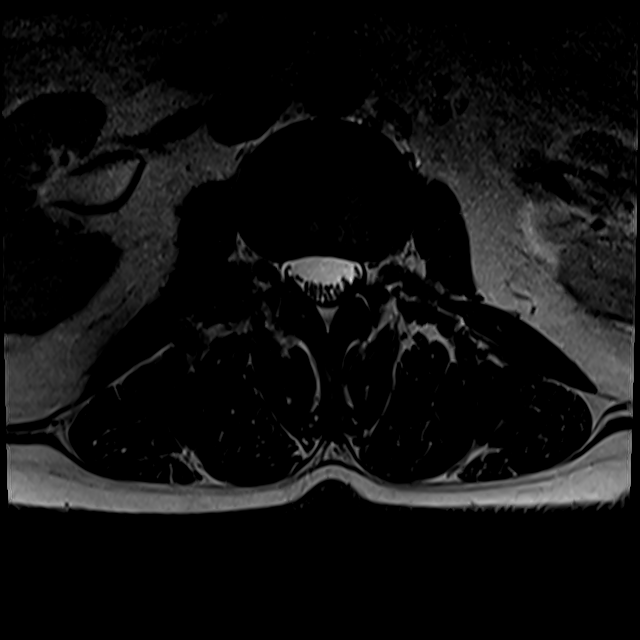
[im 38/44]
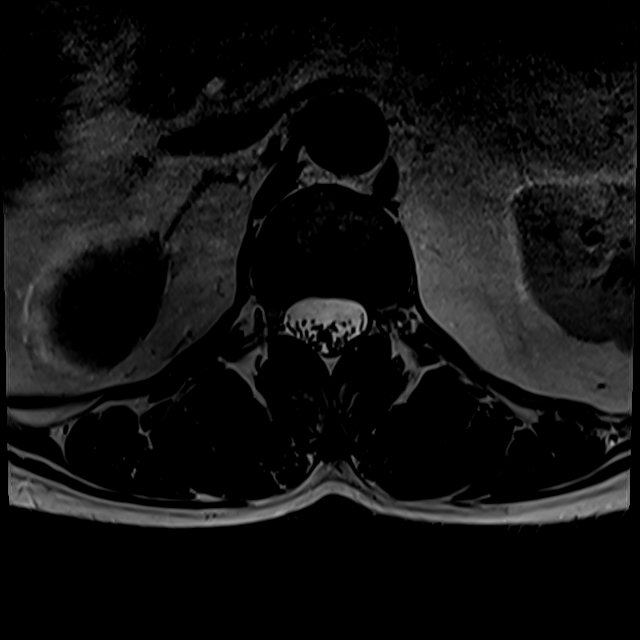

[20 of 48 positions shown; findings below may reference images not displayed]

FINDINGS: Segmentation: There are 5 non-rib bearing lumbar type vertebral
bodies with the last intervertebral disc space labeled as L5-S1.

Alignment:  Normal

Vertebrae: The vertebral body heights are well maintained. No
fracture, marrow edema,or pathologic marrow infiltration.

Conus medullaris and cauda equina: Conus extends to the level. Conus
and cauda equina appear normal.

Paraspinal and other soft tissues: The paraspinal soft tissues and
visualized retroperitoneal structures are unremarkable. The
sacroiliac joints are intact.

Disc levels:

T12-L1:  No significant canal or neural foraminal narrowing.

L1-L2:   No significant canal or neural foraminal narrowing.

L2-L3:   No significant canal or neural foraminal narrowing.

L3-L4: There is a broad-based disc bulge with ligamentum flavum
hypertrophy which causes mild bilateral neural foraminal narrowing.
There is mild effacement anterior thecal sac.

L4-L5: There is a broad-based disc bulge and ligamentum flavum
hypertrophy which causes mild bilateral neural foraminal narrowing.

L5-S1:   No significant canal or neural foraminal narrowing.
IMPRESSION: Mild lumbar spine spondylosis at L3-L4 and L4-L5.

## 2020-07-08 ENCOUNTER — Telehealth: Payer: Self-pay | Admitting: Orthopedic Surgery

## 2020-07-08 ENCOUNTER — Ambulatory Visit (INDEPENDENT_AMBULATORY_CARE_PROVIDER_SITE_OTHER): Payer: BLUE CROSS/BLUE SHIELD | Admitting: Orthopedic Surgery

## 2020-07-08 ENCOUNTER — Encounter: Payer: Self-pay | Admitting: Orthopedic Surgery

## 2020-07-08 VITALS — Ht 64.0 in | Wt 199.0 lb

## 2020-07-08 DIAGNOSIS — M1712 Unilateral primary osteoarthritis, left knee: Secondary | ICD-10-CM

## 2020-07-08 DIAGNOSIS — M5410 Radiculopathy, site unspecified: Secondary | ICD-10-CM

## 2020-07-08 NOTE — Telephone Encounter (Signed)
Patient need a call back from

## 2020-07-09 ENCOUNTER — Encounter: Payer: Self-pay | Admitting: Orthopedic Surgery

## 2020-07-09 NOTE — Progress Notes (Addendum)
Office Visit Note   Patient: Hannah Short           Date of Birth: [birth year]           MRN: [id number] Visit Date: 07/08/2020              Requested by: Lanelle Bal, PA-C [place],  [place] [address] PCP: Lanelle Bal, PA-C  Chief Complaint  Patient presents with  . Lower Back - Follow-up    MRI L spine       HPI: Patient is a 64 year old gentleman who presents in follow-up for the MRI scan of the lumbar spine she complains of persistent lower back pain as well as bilateral knee pain.  She states it difficult to get from a sitting to a standing position.  Denies any radicular pain down her legs.  Patient states she has been having GI symptoms and has a colonoscopy scheduled.   Assessment & Plan: Visit Diagnoses:  1. Radicular low back pain   2. Unilateral primary osteoarthritis, left knee     Plan: Recommended strength and exercise for her back and knees.  Recommended Voltaren gel topically for the knees and back.  Follow-Up Instructions: Return if symptoms worsen or fail to improve.   Ortho Exam  Patient is alert, oriented, no adenopathy, well-dressed, normal affect, normal respiratory effort. Examination patient has a negative straight leg raise bilaterally no focal motor weakness in either lower extremity.  Patient has to use her arms to get up from a sitting position due to leg weakness.  Patient states she has been to physical therapy in the past.  There is crepitation with range of motion of both knees but no effusion.  Review of the MRI scan shows mild lumbar spine spondylosis at L3-4 and L4-5.  Imaging: No results found. No images are attached to the encounter.  Labs: No results found for: HGBA1C, ESRSEDRATE, CRP, LABURIC, REPTSTATUS, GRAMSTAIN, CULT, LABORGA   No results found for: ALBUMIN, PREALBUMIN, LABURIC  No results found for: MG No results found for: VD25OH  No results found for: PREALBUMIN No flowsheet data found.   Body mass  index is 34.16 kg/m.  Orders:  No orders of the defined types were placed in this encounter.  No orders of the defined types were placed in this encounter.    Procedures: No procedures performed  Clinical Data: No additional findings.  ROS:  All other systems negative, except as noted in the HPI. Review of Systems  Objective: Vital Signs: Ht 5\' 4"  (1.626 m)   Wt 199 lb (90.3 kg)   BMI 34.16 kg/m   Specialty Comments:  No specialty comments available.  PMFS History: Patient Active Problem List   Diagnosis Date Noted  . Essential hypertension 03/04/2020  . Morbid obesity ([place]) 03/04/2020  . Dyspnea on exertion 11/28/2019  . Abdominal pain 11/19/2016  . Change in bowel habits 11/19/2016  . Atypical chest pain 11/19/2016  . Bloating 11/19/2016  . Migraine   . Fibromyalgia    Past Medical History:  Diagnosis Date  . Anemia   . Anxiety   . Arthritis   . Fibromyalgia   . GERD (gastroesophageal reflux disease)   . Heart murmur    mitral valve prolapse  . Hypertension   . Migraine   . Neck pain     Family History  Problem Relation Age of Onset  . Heart failure Mother   . Heart failure Father     Past  Surgical History:  Procedure Laterality Date  . CESAREAN SECTION    . HERNIA REPAIR     Social History   Occupational History  . Occupation: employed  Tobacco Use  . Smoking status: Never Smoker  . Smokeless tobacco: Never Used  Substance and Sexual Activity  . Alcohol use: No  . Drug use: No  . Sexual activity: Not on file

## 2020-07-22 ENCOUNTER — Ambulatory Visit: Payer: BLUE CROSS/BLUE SHIELD

## 2020-07-22 NOTE — Progress Notes (Deleted)
Patient ID: Hannah Short                 DOB: 11/02/1955                      MRN: [id number]     HPI: Hannah Short is a 64 y.o. female referred by Dr. Marland Kitchen to HTN clinic. PMH is significant for HTN and obesity.  Current HTN meds: amlodipine 5 mg Previously tried:  BP goal:   Family History:   Social History:   Diet:   Exercise:   Home BP readings:   Wt Readings from Last 3 Encounters:  07/08/20 199 lb (90.3 kg)  05/01/20 199 lb (90.3 kg)  04/29/20 199 lb 3.2 oz (90.4 kg)   BP Readings from Last 3 Encounters:  04/29/20 122/78  03/04/20 (!) 150/96  11/28/19 132/81   Pulse Readings from Last 3 Encounters:  04/29/20 80  03/04/20 81  11/28/19 63    Renal function: CrCl cannot be calculated (No successful lab value found.).  Past Medical History:  Diagnosis Date  . Anemia   . Anxiety   . Arthritis   . Fibromyalgia   . GERD (gastroesophageal reflux disease)   . Heart murmur    mitral valve prolapse  . Hypertension   . Migraine   . Neck pain     Current Outpatient Medications on File Prior to Visit  Medication Sig Dispense Refill  . amLODipine (NORVASC) 5 MG tablet Take 1 tablet (5 mg total) by mouth daily. 180 tablet 3  . dicyclomine (BENTYL) 10 MG capsule Take 10 mg by mouth 4 (four) times daily -  before meals and at bedtime.    . fexofenadine (ALLEGRA) 180 MG tablet Take 180 mg by mouth daily.    Marland Kitchen gabapentin (NEURONTIN) 300 MG capsule Take 300 mg by mouth in the morning and at bedtime.    Marland Kitchen omeprazole (PRILOSEC) 40 MG capsule Take 40 mg by mouth daily.    . predniSONE (DELTASONE) 10 MG tablet Take 1 tablet (10 mg total) by mouth daily with breakfast. 20 tablet 0  . Simethicone 125 MG CAPS Take by mouth.     No current facility-administered medications on file prior to visit.    Allergies  Allergen Reactions  . Topamax [Topiramate] Other (See Comments)    Pt.states makes legs go numb  . Betanidine [Bethanidine]   . Clonazepam     BALANCE ISSUE   . Flagyl [Metronidazole]   . Sulfa Antibiotics      Assessment/Plan:  1. Hypertension -

## 2020-07-31 ENCOUNTER — Encounter: Payer: Self-pay | Admitting: *Deleted

## 2020-08-12 ENCOUNTER — Ambulatory Visit (INDEPENDENT_AMBULATORY_CARE_PROVIDER_SITE_OTHER): Payer: BLUE CROSS/BLUE SHIELD | Admitting: Internal Medicine

## 2020-08-12 ENCOUNTER — Telehealth: Payer: Self-pay | Admitting: Internal Medicine

## 2020-08-12 ENCOUNTER — Ambulatory Visit (INDEPENDENT_AMBULATORY_CARE_PROVIDER_SITE_OTHER): Payer: BLUE CROSS/BLUE SHIELD | Admitting: Pharmacist

## 2020-08-12 ENCOUNTER — Encounter: Payer: Self-pay | Admitting: Internal Medicine

## 2020-08-12 ENCOUNTER — Other Ambulatory Visit: Payer: Self-pay

## 2020-08-12 ENCOUNTER — Other Ambulatory Visit (INDEPENDENT_AMBULATORY_CARE_PROVIDER_SITE_OTHER): Payer: BLUE CROSS/BLUE SHIELD

## 2020-08-12 VITALS — BP 104/68 | HR 68 | Ht 64.0 in | Wt 199.0 lb

## 2020-08-12 VITALS — BP 116/74 | HR 66

## 2020-08-12 DIAGNOSIS — I10 Essential (primary) hypertension: Secondary | ICD-10-CM | POA: Diagnosis not present

## 2020-08-12 DIAGNOSIS — R1900 Intra-abdominal and pelvic swelling, mass and lump, unspecified site: Secondary | ICD-10-CM

## 2020-08-12 DIAGNOSIS — R635 Abnormal weight gain: Secondary | ICD-10-CM | POA: Diagnosis not present

## 2020-08-12 DIAGNOSIS — Z86010 Personal history of colonic polyps: Secondary | ICD-10-CM | POA: Diagnosis not present

## 2020-08-12 DIAGNOSIS — K2100 Gastro-esophageal reflux disease with esophagitis, without bleeding: Secondary | ICD-10-CM | POA: Diagnosis not present

## 2020-08-12 LAB — COMPREHENSIVE METABOLIC PANEL
ALT: 24 U/L (ref 0–35)
AST: 18 U/L (ref 0–37)
Albumin: 4.5 g/dL (ref 3.5–5.2)
Alkaline Phosphatase: 71 U/L (ref 39–117)
BUN: 12 mg/dL (ref 6–23)
CO2: 33 mEq/L — ABNORMAL HIGH (ref 19–32)
Calcium: 9.3 mg/dL (ref 8.4–10.5)
Chloride: 102 mEq/L (ref 96–112)
Creatinine, Ser: 0.73 mg/dL (ref 0.40–1.20)
GFR: 87.64 mL/min (ref >60.00)
Glucose, Bld: 85 mg/dL (ref 70–99)
Potassium: 3.5 mEq/L (ref 3.5–5.1)
Sodium: 141 mEq/L (ref 135–145)
Total Bilirubin: 0.5 mg/dL (ref 0.2–1.2)
Total Protein: 7.2 g/dL (ref 6.0–8.3)

## 2020-08-12 LAB — CBC WITH DIFFERENTIAL/PLATELET
Basophils Absolute: 0.1 10*3/uL (ref 0.0–0.1)
Basophils Relative: 1.2 % (ref 0.0–3.0)
Eosinophils Absolute: 0.1 10*3/uL (ref 0.0–0.7)
Eosinophils Relative: 2.5 % (ref 0.0–5.0)
HCT: 41.4 % (ref 36.0–46.0)
Hemoglobin: 14 g/dL (ref 12.0–15.0)
Lymphocytes Relative: 43.6 % (ref 12.0–46.0)
Lymphs Abs: 2.4 10*3/uL (ref 0.7–4.0)
MCHC: 33.9 g/dL (ref 30.0–36.0)
MCV: 90 fl (ref 78.0–100.0)
Monocytes Absolute: 0.7 10*3/uL (ref 0.1–1.0)
Monocytes Relative: 12.6 % — ABNORMAL HIGH (ref 3.0–12.0)
Neutro Abs: 2.2 10*3/uL (ref 1.4–7.7)
Neutrophils Relative %: 40.1 % — ABNORMAL LOW (ref 43.0–77.0)
Platelets: 283 10*3/uL (ref 150.0–400.0)
RBC: 4.6 Mil/uL (ref 3.87–5.11)
RDW: 13 % (ref 11.5–15.5)
WBC: 5.5 10*3/uL (ref 4.0–10.5)

## 2020-08-12 LAB — CORTISOL: Cortisol, Plasma: 3.4 ug/dL

## 2020-08-12 LAB — TSH: TSH: 3.28 u[IU]/mL (ref 0.35–4.50)

## 2020-08-12 NOTE — Patient Instructions (Addendum)
Return for a  follow up appointment AS NEEDED  Check your blood pressure at home daily (if able) and keep record of the readings.  Take your BP meds as follows: *NO CHANGE*  Bring all of your meds, your BP cuff and your record of home blood pressures to your next appointment.  Exercise as you're able, try to walk approximately 30 minutes per day.  Keep salt intake to a minimum, especially watch canned and prepared boxed foods.  Eat more fresh fruits and vegetables and fewer canned items.  Avoid eating in fast food restaurants.    HOW TO TAKE YOUR BLOOD PRESSURE: . Rest 5 minutes before taking your blood pressure. .  Don't smoke or drink caffeinated beverages for at least 30 minutes before. . Take your blood pressure before (not after) you eat. . Sit comfortably with your back supported and both feet on the floor (don't cross your legs). . Elevate your arm to heart level on a table or a desk. . Use the proper sized cuff. It should fit smoothly and snugly around your bare upper arm. There should be enough room to slip a fingertip under the cuff. The bottom edge of the cuff should be 1 inch above the crease of the elbow. . Ideally, take 3 measurements at one sitting and record the average.    

## 2020-08-12 NOTE — Progress Notes (Signed)
Patient ID: Hannah Short, female   DOB: [birth year], 65 y.o.   MRN: [id number] HPI: Hannah Short is a 65 year old female with a history of GERD with mild reflux esophagitis and 2018, 3 cm hiatal hernia, nonadvanced adenoma of the colon, diverticulosis, hypertension, fibromyalgia who is seen in consult at the request of Lanelle Bal, PA-C to evaluate increasing abdominal girth and discomfort.  She is known to our practice from evaluation in summer 2018 when she had an upper endoscopy to evaluate generalized abdominal pain and noncardiac chest pain.  EGD on 02/16/2017 showed LA grade a esophagitis, 3 cm hiatal hernia.  The exam was otherwise normal.  Gastric biopsies showed mild chronic gastritis without H. Pylori. Colonoscopy on 02/16/2017 showed a 5 mm adenoma of the transverse colon which was removed, a medium size ascending colon lipoma, sigmoid diverticulosis and small internal hemorrhoids.  Today she reports that she has had increasing abdominal girth and swelling.  She has a bloated symptom.  She reports that she does not feel that her appetite explains her weight gain or increasing abdominal girth.  She reports she eats less now than ever before.  Her appetite is decreased and she gets full quickly.  The abdomen does not have true pain but pressure and it also pushes up on her diaphragm and makes her have shortness of breath at times.  She reports her bowel movements as 2-3 times per day normally formed but can be urgent and almost like "dumping" if she eats [place] food.  She denies heartburn but reports that she is taking lansoprazole 30 mg daily and famotidine 40 mg daily at the same time as lansoprazole. She does report that she has gained 15 to 20 pounds in the last 6 months or so.  She has been seen by cardiology. She recently had a coronary artery CT scan which showed a score of 0. She will be meeting with healthy weight and wellness for an appointment in 1 week.  Past Medical History:   Diagnosis Date  . Acute torn meniscus of knee, left, initial encounter 2019  . Anemia   . Anxiety   . Arthritis   . Chronically dry eyes, bilateral   . Diverticulosis   . Fibromyalgia   . GERD (gastroesophageal reflux disease)   . Heart murmur    mitral valve prolapse  . Hiatal hernia   . Hypertension   . Internal hemorrhoids   . Lipoma of colon   . Migraine   . Neck pain    L3 4 and 5 bulging discs  . Tubular adenoma of colon     Past Surgical History:  Procedure Laterality Date  . CESAREAN SECTION    . HERNIA REPAIR     1997  . TOE SURGERY  1990   stepped on toothpick    Outpatient Medications Prior to Visit  Medication Sig Dispense Refill  . Cholecalciferol (VITAMIN D) 125 MCG (5000 UT) CAPS Take 125 mcg by mouth.    . dicyclomine (BENTYL) 10 MG capsule Take 10 mg by mouth as needed.    . famotidine (PEPCID) 40 MG tablet Take 40 mg by mouth daily.    . fexofenadine (ALLEGRA) 180 MG tablet Take 180 mg by mouth daily as needed.    . gabapentin (NEURONTIN) 300 MG capsule Take 300 mg by mouth in the morning and at bedtime.    . lansoprazole (PREVACID) 30 MG capsule Take 30 mg by mouth daily at 12 noon.    . Simethicone  125 MG CAPS Take by mouth as needed.    Marland Kitchen amLODipine (NORVASC) 5 MG tablet Take 1 tablet (5 mg total) by mouth daily. 180 tablet 3  . fluorometholone (FML) 0.1 % ophthalmic suspension 1 drop 3 (three) times daily.    . RESTASIS 0.05 % ophthalmic emulsion 1 drop 2 (two) times daily.     No facility-administered medications prior to visit.    Allergies  Allergen Reactions  . Topamax [Topiramate] Other (See Comments)    Pt.states makes legs go numb  . Betanidine [Bethanidine]   . Clonazepam     BALANCE ISSUE  . Flagyl [Metronidazole]   . Sulfa Antibiotics     Family History  Problem Relation Age of Onset  . Heart failure Mother   . Heart failure Father   . Colon cancer Father     Social History   Tobacco Use  . Smoking status: Never  Smoker  . Smokeless tobacco: Never Used  Substance Use Topics  . Alcohol use: No  . Drug use: No    ROS: As per history of present illness, otherwise negative  BP 104/68   Pulse 68   Ht 5\' 4"  (1.626 m)   Wt 199 lb (90.3 kg)   SpO2 98%   BMI 34.16 kg/m  Constitutional: Well-developed and well-nourished. No distress. HEENT: Normocephalic and atraumatic. Oropharynx is clear and moist. Conjunctivae are normal.  No scleral icterus. Neck: Neck supple. Trachea midline. Cardiovascular: Normal rate, regular rhythm and intact distal pulses. No M/R/G Pulmonary/chest: Effort normal and breath sounds normal. No wheezing, rales or rhonchi. Abdominal: Soft, nontender, nondistended. Bowel sounds active throughout. There are no masses palpable. No hepatosplenomegaly. Extremities: no clubbing, cyanosis, 2+ b/l LE edema Neurological: Alert and oriented to person place and time. Skin: Skin is warm and dry.  Psychiatric: Normal mood and affect. Behavior is normal.  RELEVANT LABS AND IMAGING: CBC    Component Value Date/Time   WBC 5.5 08/12/2020 1437   RBC 4.60 08/12/2020 1437   HGB 14.0 08/12/2020 1437   HCT 41.4 08/12/2020 1437   PLT 283.0 08/12/2020 1437   MCV 90.0 08/12/2020 1437   MCHC 33.9 08/12/2020 1437   RDW 13.0 08/12/2020 1437   LYMPHSABS 2.4 08/12/2020 1437   MONOABS 0.7 08/12/2020 1437   EOSABS 0.1 08/12/2020 1437   BASOSABS 0.1 08/12/2020 1437    CMP     Component Value Date/Time   NA 141 08/12/2020 1437   K 3.5 08/12/2020 1437   CL 102 08/12/2020 1437   CO2 33 (H) 08/12/2020 1437   GLUCOSE 85 08/12/2020 1437   BUN 12 08/12/2020 1437   CREATININE 0.73 08/12/2020 1437   CALCIUM 9.3 08/12/2020 1437   PROT 7.2 08/12/2020 1437   ALBUMIN 4.5 08/12/2020 1437   AST 18 08/12/2020 1437   ALT 24 08/12/2020 1437   ALKPHOS 71 08/12/2020 1437   BILITOT 0.5 08/12/2020 1437    ASSESSMENT/PLAN: 65 year old female with a history of GERD with mild reflux esophagitis and 2018,  3 cm hiatal hernia, nonadvanced adenoma of the colon, diverticulosis, hypertension, fibromyalgia who is seen in consult at the request of Lanelle Bal, PA-C to evaluate increasing abdominal girth and discomfort.  1. Increasing abdominal girth/abdominal bloating --there is evidence of some volume overload on exam. I would like to exclude ascites in the abdomen but also evaluate her discomfort further by cross-sectional imaging. I recommended the following -- CBC, CMP, TSH and random cortisol -- CT scan abdomen pelvis with contrast --  If CT unrevealing then consider SIBO breath testing -- She will be meeting with healthy weight and wellness which is appropriate -- I asked that she contact her primary care provider to discuss her lower extremity edema further  2. GERD --mild esophagitis seen by EGD 3 years ago. I am not sure she needs dual therapy and so I recommended the following: --Continue lansoprazole 30 mg once daily -- Change famotidine 40 mg to every afternoon as needed  3. history of adenoma --small adenoma and guidelines have changed since her last colonoscopy. She had a full colonoscopy with a good prep. No family history of colon cancer. We will change recall colonoscopy to July 2025   GX:[id number], [place], [place],  [place] [address]

## 2020-08-12 NOTE — Telephone Encounter (Signed)
I have spoken to patient and date/time work well for her. I have also advised Teryn of this.

## 2020-08-12 NOTE — Progress Notes (Signed)
Patient ID: [name]                 DOB: [birth year]                      MRN: [id number]     HPI:  Hannah Short is a 65 y.o. female referred by Dr. Gwenlyn Found to HTN clinic. PMH includes hypertension, GERD, and chronic pain. Her pain today is 6 out of 10.  Patient admits occasional dietary indiscretions but follows low sodium diet for the most part. Reports ccompliance with all medication and is already scheduled for 1st visit with healthy weight and wellness.   Current HTN meds:  Amlodipine 5mg  daily  BP goal: <130/80  Family History: Dad and grandmother both had HTN, 3 brothers are diabetic, 2 brothers are pre-diabetic, 1 brother has HTN  Social History: no smoking or alcohol, denies caffeine  Diet: eat out nearly every day, Chinese, steak, pastas, does not add additional salt onto foods, drinks decaf tea daily   Exercise: limited d/t chronic pain   Home BP readings:  9 readings, average 124/76, HR range 64-88 bpm Home BP cuff (arm) accurate without 86mmHg from manual reading  Wt Readings from Last 3 Encounters:  08/12/20 199 lb (90.3 kg)  07/08/20 199 lb (90.3 kg)  05/01/20 199 lb (90.3 kg)   BP Readings from Last 3 Encounters:  08/12/20 104/68  08/12/20 116/74  04/29/20 122/78   Pulse Readings from Last 3 Encounters:  08/12/20 68  08/12/20 66  04/29/20 80    Renal function: Estimated Creatinine Clearance: 78.3 mL/min (by C-G formula based on SCr of 0.73 mg/dL).  Past Medical History:  Diagnosis Date  . Acute torn meniscus of knee, left, initial encounter 2019  . Anemia   . Anxiety   . Arthritis   . Chronically dry eyes, bilateral   . Diverticulosis   . Fibromyalgia   . GERD (gastroesophageal reflux disease)   . Heart murmur    mitral valve prolapse  . Hiatal hernia   . Hypertension   . Internal hemorrhoids   . Lipoma of colon   . Migraine   . Neck pain    L3 4 and 5 bulging discs  . Tubular adenoma of colon     Current Outpatient Medications on  File Prior to Visit  Medication Sig Dispense Refill  . amLODipine (NORVASC) 5 MG tablet Take 1 tablet (5 mg total) by mouth daily. 180 tablet 3  . Cholecalciferol (VITAMIN D) 125 MCG (5000 UT) CAPS Take 125 mcg by mouth.    . dicyclomine (BENTYL) 10 MG capsule Take 10 mg by mouth as needed.    . famotidine (PEPCID) 40 MG tablet Take 40 mg by mouth daily.    . fexofenadine (ALLEGRA) 180 MG tablet Take 180 mg by mouth daily as needed.    . gabapentin (NEURONTIN) 300 MG capsule Take 300 mg by mouth in the morning and at bedtime.    . lansoprazole (PREVACID) 30 MG capsule Take 30 mg by mouth daily at 12 noon.    . fluorometholone (FML) 0.1 % ophthalmic suspension 1 drop 3 (three) times daily.    . RESTASIS 0.05 % ophthalmic emulsion 1 drop 2 (two) times daily.    . Simethicone 125 MG CAPS Take by mouth as needed.     No current facility-administered medications on file prior to visit.    Allergies  Allergen Reactions  . Topamax [Topiramate] Other (See  Comments)    Pt.states makes legs go numb  . Betanidine [Bethanidine]   . Clonazepam     BALANCE ISSUE  . Flagyl [Metronidazole]   . Sulfa Antibiotics     Blood pressure 116/74, pulse 66.  Essential hypertension Blood pressure is well controlled today and at home. Patient is scheduled for Healthy weight and management as well.   Will continue current therapy without changes and follow up as needed. She is due to see DR Gwenlyn Found in March/2022.    Hannah Short PharmD, BCPS, [place] [id number] 08/15/2020 4:46 PM

## 2020-08-12 NOTE — Telephone Encounter (Signed)
Inbound call from [place] with [facility] stating could not get in contact with patient.  She scheduled her for 08/14/20 at 11am.  Ask if you can get in contact with patient please to see if appointment will work for her or if it needs to be moved.

## 2020-08-12 NOTE — Patient Instructions (Addendum)
Your provider has requested that you go to the basement level for lab work before leaving today. Press "B" on the elevator. The lab is located at the first door on the left as you exit the elevator. __________________________________________________________  Hannah Short have been scheduled for a CT scan of the abdomen and pelvis at [facility] ([address]---this is in the same building as [employer]).   You are scheduled on _________________ at ____________. You should arrive 15 minutes prior to your appointment time for registration. Please follow the written instructions below on the day of your exam:  WARNING: IF YOU ARE ALLERGIC TO IODINE/X-RAY DYE, PLEASE NOTIFY RADIOLOGY IMMEDIATELY AT [phone]! YOU WILL BE GIVEN A 13 HOUR PREMEDICATION PREP.  1) Do not eat or drink anything after _________________ (4 hours prior to your test) 2) You have been given 2 bottles of oral contrast to drink. The solution may taste better if refrigerated, but do NOT add ice or any other liquid to this solution. Shake well before drinking.    Drink 1 bottle of contrast @ ____________ (2 hours prior to your exam)  Drink 1 bottle of contrast @ ____________ (1 hour prior to your exam)  You may take any medications as prescribed with a small amount of water, if necessary. If you take any of the following medications: METFORMIN, GLUCOPHAGE, GLUCOVANCE, AVANDAMET, RIOMET, FORTAMET, [place] MET, JANUMET, GLUMETZA or METAGLIP, you MAY be asked to HOLD this medication 48 hours AFTER the exam.  The purpose of you drinking the oral contrast is to aid in the visualization of your intestinal tract. The contrast solution may cause some diarrhea. Depending on your individual set of symptoms, you may also receive an intravenous injection of x-ray contrast/dye. Plan on being at [facility] for 30 minutes or longer, depending on the type of exam you are having performed.  This test typically takes  30-45 minutes to complete.  If you have any questions regarding your exam or if you need to reschedule, you may call the CT department at [phone] between the hours of 8:00 am and 5:00 pm, Monday-Friday.  __________________________________________________________  Continue Prevacid 30 mg daily.  __________________________________________________________  Change your famotidine to 40 mg as needed. __________________________________________________________  If you are age 64 or older, your body mass index should be between 23-30. Your Body mass index is 34.16 kg/m. If this is out of the aforementioned range listed, please consider follow up with your Primary Care Provider.  If you are age 64 or younger, your body mass index should be between 19-25. Your Body mass index is 34.16 kg/m. If this is out of the aformentioned range listed, please consider follow up with your Primary Care Provider.   __________________________________________________________ Due to recent changes in healthcare laws, you may see the results of your imaging and laboratory studies on MyChart before your provider has had a chance to review them.  We understand that in some cases there may be results that are confusing or concerning to you. Not all laboratory results come back in the same time frame and the provider may be waiting for multiple results in order to interpret others.  Please give [place] 48 hours in order for your provider to thoroughly review all the results before contacting the office for clarification of your results.

## 2020-08-14 ENCOUNTER — Other Ambulatory Visit: Payer: Self-pay

## 2020-08-14 ENCOUNTER — Telehealth: Payer: Self-pay | Admitting: Internal Medicine

## 2020-08-14 ENCOUNTER — Ambulatory Visit (INDEPENDENT_AMBULATORY_CARE_PROVIDER_SITE_OTHER)
Admission: RE | Admit: 2020-08-14 | Discharge: 2020-08-14 | Disposition: A | Discharge status: Home / Self Care | Payer: BLUE CROSS/BLUE SHIELD | Source: Ambulatory Visit | Attending: Internal Medicine | Admitting: Internal Medicine

## 2020-08-14 DIAGNOSIS — R635 Abnormal weight gain: Secondary | ICD-10-CM

## 2020-08-14 DIAGNOSIS — R1900 Intra-abdominal and pelvic swelling, mass and lump, unspecified site: Secondary | ICD-10-CM | POA: Diagnosis not present

## 2020-08-14 DIAGNOSIS — K760 Fatty (change of) liver, not elsewhere classified: Secondary | ICD-10-CM | POA: Diagnosis not present

## 2020-08-14 DIAGNOSIS — R109 Unspecified abdominal pain: Secondary | ICD-10-CM | POA: Diagnosis not present

## 2020-08-14 IMAGING — CT CT ABD-PELV W/ CM
2 of 5 series · 16 of 46 positions shown, 18 images · IV contrast (omnipaque)
Comparison: Abdominal ultrasound [DATE] and report from CT
abdomen from [DATE]

CLINICAL DATA: Abdominal swelling and weight gain.  Bloating.

EXAM:
CT ABDOMEN AND PELVIS WITH CONTRAST
TECHNIQUE: Multidetector CT imaging of the abdomen and pelvis was performed
using the standard protocol following bolus administration of
intravenous contrast.
CONTRAST:  100mL OMNIPAQUE IOHEXOL 300 MG/ML  SOLN

[Series 3: abd/pel w · axial · 0.81mm/px · z∈[-489,-84]mm · 13 of 93 slices shown, 15 images]
[im 6/93  soft-tissue]
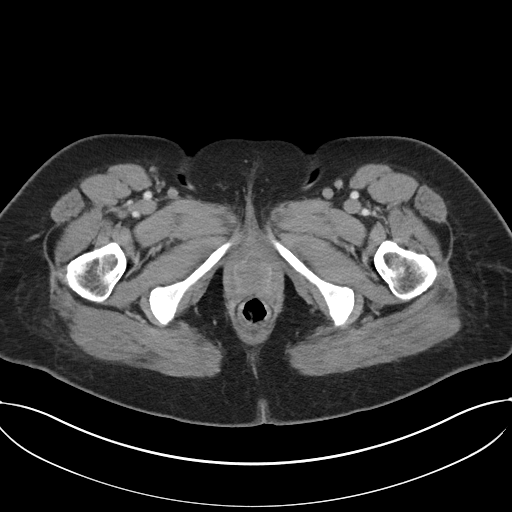
[im 6/93  bone]
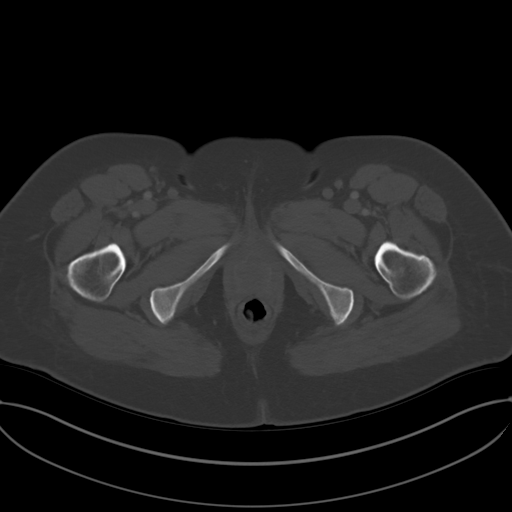
[im 11/93  soft-tissue]
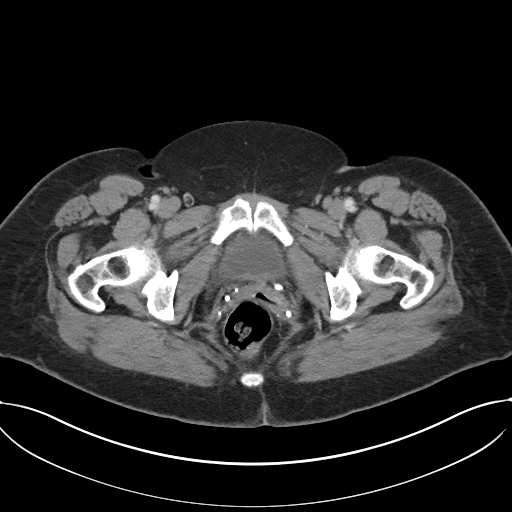
[im 22/93  soft-tissue]
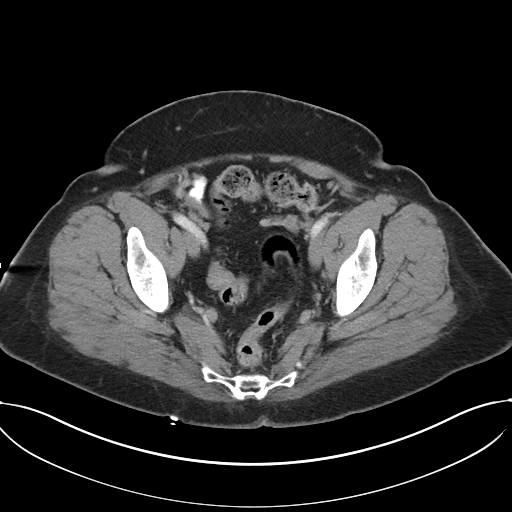
[im 28/93  soft-tissue]
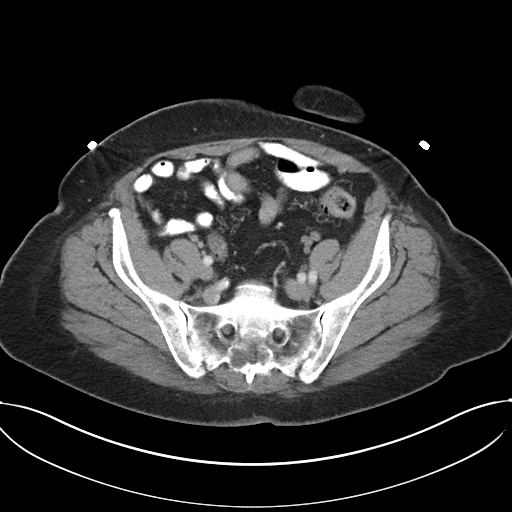
[im 33/93  soft-tissue]
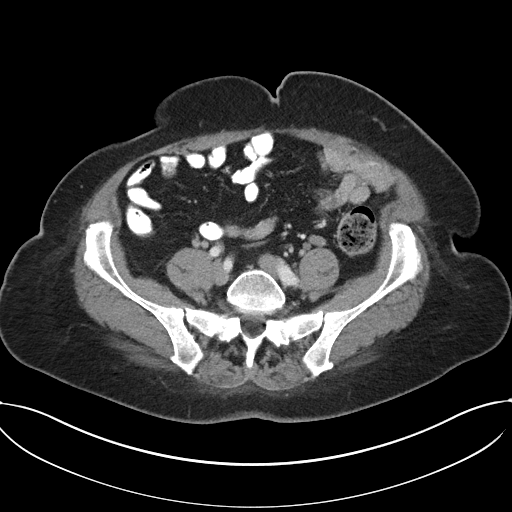
[im 38/93  soft-tissue]
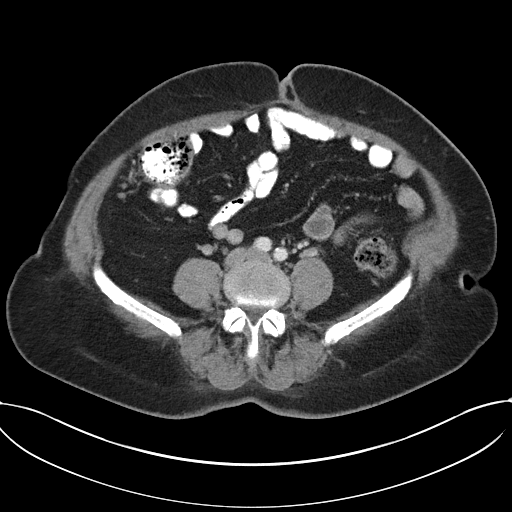
[im 49/93  soft-tissue]
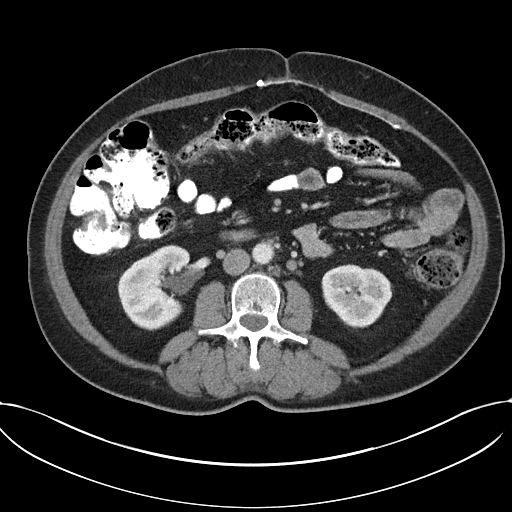
[im 55/93  soft-tissue]
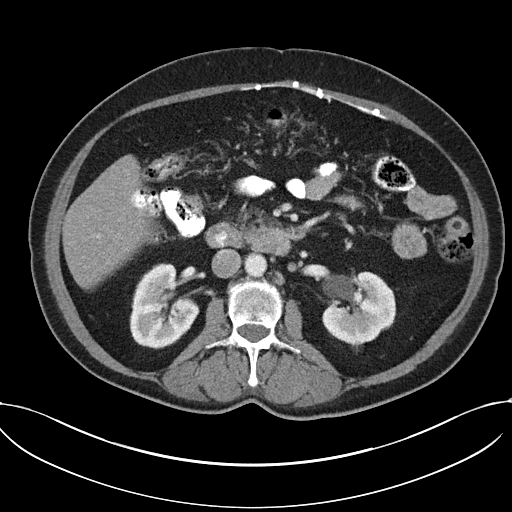
[im 60/93  soft-tissue]
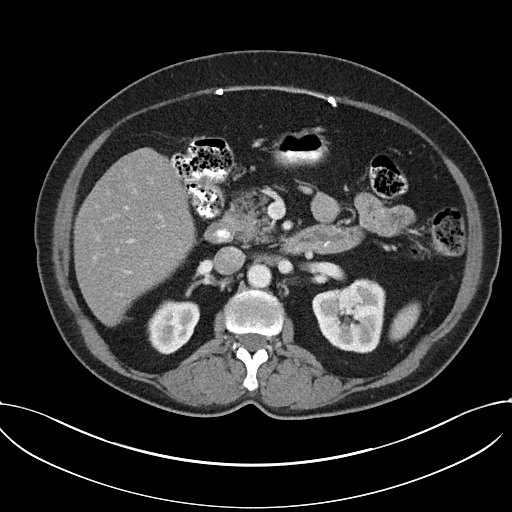
[im 60/93  bone]
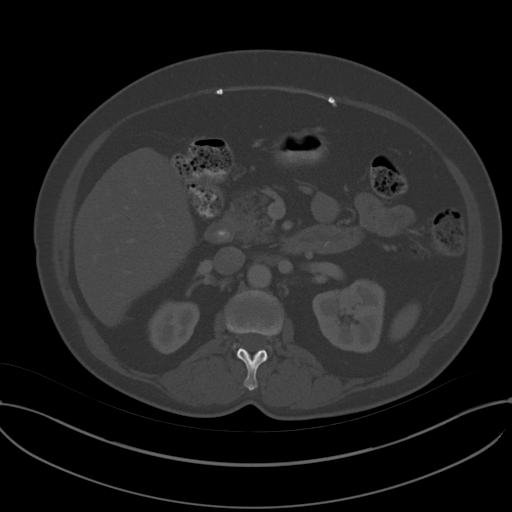
[im 65/93  soft-tissue]
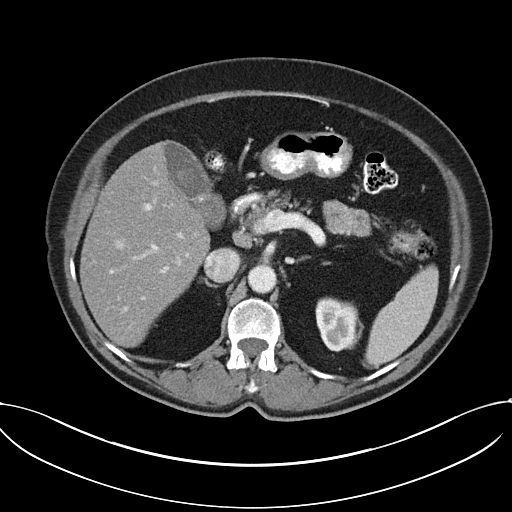
[im 71/93  soft-tissue]
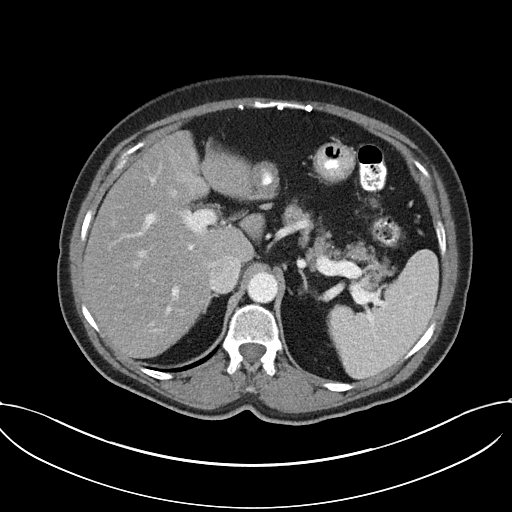
[im 82/93  soft-tissue]
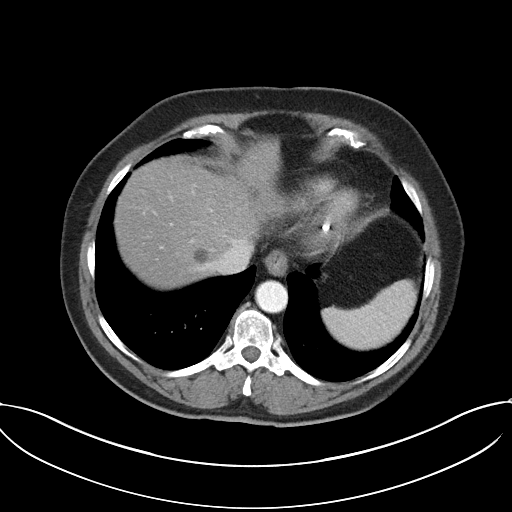
[im 87/93  soft-tissue]
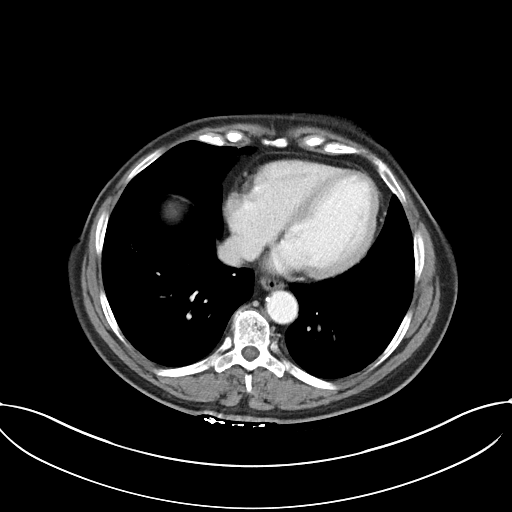

[Series 6: coronal st · coronal · 0.84mm/px · 3 of 110 slices shown]
[im 37/110  soft-tissue]
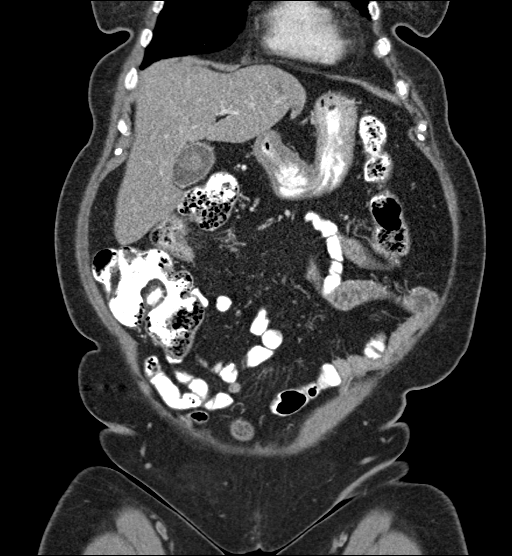
[im 49/110  soft-tissue]
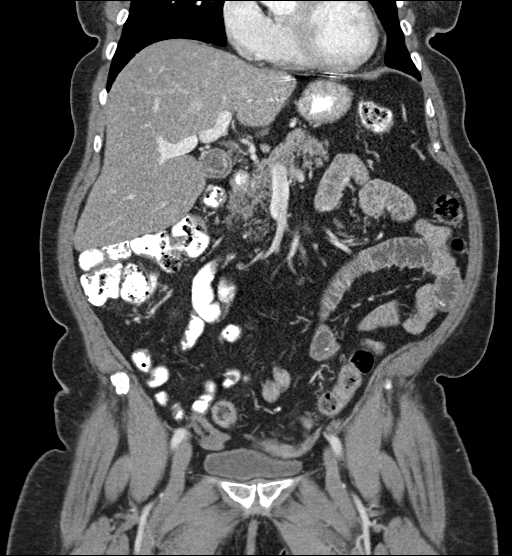
[im 61/110  soft-tissue]
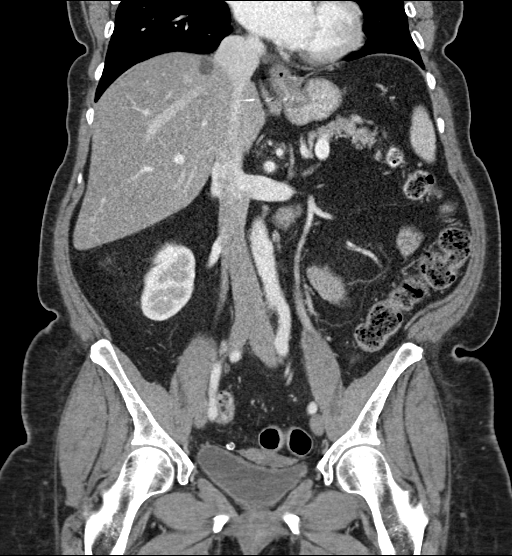

[16 of 46 positions shown; findings below may reference images not displayed]

FINDINGS: Lower chest: A 7 by 4 by 5 mm (volume = 70 mm^3) right lower lobe
nodule is present on image 7 of series 4, stable from [DATE]. On
the prior CT angiogram of the chest from [DATE] which is
currently not available, a 6 mm nodule was reported in the right
lower lobe and probably corresponds to this finding. Minimal
subsegmental atelectasis or scarring in the lingula.

Hepatobiliary: Suspected hepatic steatosis. Multiple hepatic cysts,
most of which were present and of similar size on [DATE].

Multiple gallstones in the gallbladder measuring up to 2.6 cm in
diameter. No appreciable gallbladder wall thickening or biliary
dilatation.

Pancreas: Unremarkable

Spleen: Unremarkable

Adrenals/Urinary Tract: Both adrenal glands appear normal. Hypodense
0.8 cm left kidney upper pole lesion corresponding to the cyst shown
on [DATE] ultrasound. Exophytic hypodense 1.3 cm left posterior
mid kidney lesion on image 23 of series 2, corresponding to the cyst
shown on prior ultrasound, but with some mild complexity, measuring
16 Hounsfield units on portal venous phase images. No urinary tract
calculi are currently identified.

Stomach/Bowel: Postoperative findings along the proximal stomach,
query or hernia repair.

Vascular/Lymphatic: Aortoiliac atherosclerotic vascular disease. No
pathologic adenopathy identified.

Reproductive: Unremarkable

Other: No supplemental non-categorized findings.

Musculoskeletal: Anterior abdominal wall hernia mesh. No recurrent
hernia.
IMPRESSION: 1. A specific cause for the patient's abdominal pain is not
identified.
2. Other imaging findings of potential clinical significance:
Suspected hepatic steatosis. Cholelithiasis. Multiple hepatic cysts.
Anterior abdominal wall hernia mesh. A 7 by 4 by 5 mm right lower
lobe pulmonary nodule is present on image 7 of series 4, stable from
[DATE]. On the prior CT angiogram of the chest from [DATE],
a 6 mm nodule was reported in the right lower lobe and probably
corresponds to this finding, which is likewise unchanged from
[DATE]. This does not require further workup.
3. Aortic atherosclerosis.

Aortic Atherosclerosis ([XI]-[XI]).

## 2020-08-14 MED ORDER — IOHEXOL 300 MG/ML  SOLN
100.0000 mL | Freq: Once | INTRAMUSCULAR | Status: AC | PRN
  Administered 2020-08-14: 100 mL via INTRAVENOUS

## 2020-08-15 ENCOUNTER — Telehealth: Payer: Self-pay | Admitting: *Deleted

## 2020-08-15 ENCOUNTER — Encounter: Payer: Self-pay | Admitting: Pharmacist

## 2020-08-15 NOTE — Assessment & Plan Note (Signed)
Blood pressure is well controlled today and at home. Patient is scheduled for [facility] as well.   Will continue current therapy without changes and follow up as needed. She is due to see DR Gwenlyn Found in March/2022.

## 2020-08-15 NOTE — Telephone Encounter (Signed)
See result notes. 

## 2020-08-15 NOTE — Telephone Encounter (Signed)
error 

## 2020-08-19 ENCOUNTER — Ambulatory Visit (INDEPENDENT_AMBULATORY_CARE_PROVIDER_SITE_OTHER): Payer: BLUE CROSS/BLUE SHIELD | Admitting: Family Medicine

## 2020-09-01 ENCOUNTER — Ambulatory Visit (INDEPENDENT_AMBULATORY_CARE_PROVIDER_SITE_OTHER): Payer: BLUE CROSS/BLUE SHIELD | Admitting: Family Medicine

## 2020-09-01 ENCOUNTER — Other Ambulatory Visit: Payer: Self-pay

## 2020-09-01 ENCOUNTER — Encounter (INDEPENDENT_AMBULATORY_CARE_PROVIDER_SITE_OTHER): Payer: Self-pay | Admitting: Family Medicine

## 2020-09-01 ENCOUNTER — Telehealth: Payer: Self-pay | Admitting: Internal Medicine

## 2020-09-01 VITALS — BP 119/68 | HR 68 | Temp 97.8°F | Ht 64.0 in | Wt 195.0 lb

## 2020-09-01 DIAGNOSIS — R0602 Shortness of breath: Secondary | ICD-10-CM

## 2020-09-01 DIAGNOSIS — I10 Essential (primary) hypertension: Secondary | ICD-10-CM

## 2020-09-01 DIAGNOSIS — E669 Obesity, unspecified: Secondary | ICD-10-CM

## 2020-09-01 DIAGNOSIS — R5383 Other fatigue: Secondary | ICD-10-CM

## 2020-09-01 DIAGNOSIS — E559 Vitamin D deficiency, unspecified: Secondary | ICD-10-CM

## 2020-09-01 DIAGNOSIS — Z6833 Body mass index (BMI) 33.0-33.9, adult: Secondary | ICD-10-CM

## 2020-09-01 DIAGNOSIS — Z9189 Other specified personal risk factors, not elsewhere classified: Secondary | ICD-10-CM

## 2020-09-01 DIAGNOSIS — Z0289 Encounter for other administrative examinations: Secondary | ICD-10-CM

## 2020-09-01 DIAGNOSIS — F3289 Other specified depressive episodes: Secondary | ICD-10-CM

## 2020-09-01 DIAGNOSIS — K760 Fatty (change of) liver, not elsewhere classified: Secondary | ICD-10-CM

## 2020-09-01 NOTE — Telephone Encounter (Signed)
Inbound call from patient stating she was suppose to come do some type of test on 08/19/2020 but due to the weather was not able to and is wanting to know if she can come today.  Please advise.

## 2020-09-01 NOTE — Telephone Encounter (Signed)
Fyi, pt called stating that she was referring to breath test that she wa supposed to pick up. Confirmed with front desk that kit is up there and let pt know that she can come pick it up today.

## 2020-09-01 NOTE — Telephone Encounter (Signed)
Do not see where pt had anything scheduled with this office, it appears it may have been [facility], instructed pt to call their office.. Left message for pt to call back if she had any further issues.

## 2020-09-02 ENCOUNTER — Ambulatory Visit (INDEPENDENT_AMBULATORY_CARE_PROVIDER_SITE_OTHER): Payer: BLUE CROSS/BLUE SHIELD | Admitting: Family Medicine

## 2020-09-02 LAB — HEMOGLOBIN A1C
Est. average glucose Bld gHb Est-mCnc: 108 mg/dL
Hgb A1c MFr Bld: 5.4 % (ref 4.8–5.6)

## 2020-09-02 LAB — LIPID PANEL WITH LDL/HDL RATIO
Cholesterol, Total: 168 mg/dL (ref 100–199)
HDL: 55 mg/dL (ref >39)
LDL Chol Calc (NIH): 95 mg/dL (ref 0–99)
LDL/HDL Ratio: 1.7 ratio (ref 0.0–3.2)
Triglycerides: 101 mg/dL (ref 0–149)
VLDL Cholesterol Cal: 18 mg/dL (ref 5–40)

## 2020-09-02 LAB — T4: T4, Total: 6.2 ug/dL (ref 4.5–12.0)

## 2020-09-02 LAB — INSULIN, RANDOM: INSULIN: 19.9 u[IU]/mL (ref 2.6–24.9)

## 2020-09-02 LAB — VITAMIN D 25 HYDROXY (VIT D DEFICIENCY, FRACTURES): Vit D, 25-Hydroxy: 40.3 ng/mL (ref 30.0–100.0)

## 2020-09-02 LAB — VITAMIN B12: Vitamin B-12: 334 pg/mL (ref 232–1245)

## 2020-09-02 LAB — T3: T3, Total: 150 ng/dL (ref 71–180)

## 2020-09-02 NOTE — Progress Notes (Signed)
Dear Dr. Gwenlyn Short,   Thank you for referring Hannah Short to our clinic. The following note includes my evaluation and treatment recommendations.  Chief Complaint:   Hannah Short (MR# [id number]) is a 65 y.o. female who presents for evaluation and treatment of obesity and related comorbidities. Current BMI is Body mass index is 33.47 kg/m. Hannah Short has been struggling with her weight for many years and has been unsuccessful in either losing weight, maintaining weight loss, or reaching her healthy weight goal.  Hannah Short is currently in the action stage of change and ready to dedicate time achieving and maintaining a healthier weight. Hannah Short is interested in becoming our patient and working on intensive lifestyle modifications including (but not limited to) diet and exercise for weight loss.  Hannah Short was referred by Dr. Gwenlyn Short.  She is lactose intolerant.  Emotional eater daily.  Eating out most days.  Wakes up at 5:30 to care for her husband and then her daughter.  Lunch is Peanut Butter Cap 'N Crunch with Lactaid mild (3.5 cups + 1.5 cups) (feels full).  At around 3 pm, [employer] grilled chicken sandwich +/- fries and ketchup and sweet tea (will eat all and feel full).  Dinner will be [place] or fast food (RB sandwich mini sliders).  After dinner - chips (2 handful) or ice cream (1 cup).  Hannah Short's habits were reviewed today and are as follows: Her family eats meals together, she thinks her family will eat healthier with her, her desired weight loss is 41 pounds, she started gaining weight in 2018, her heaviest weight ever was 196 pounds, she craves chicken, potatoes, and green beans, she skips breakfast frequently, she is frequently drinking liquids with calories, she frequently makes poor food choices and she struggles with emotional eating.  Depression Screen Hannah Short (modified PHQ-9) score was 11.  Depression screen Hannah Short 2/9 09/01/2020  Decreased Interest 2  Down,  Depressed, Hopeless 1  PHQ - 2 Score 3  Altered sleeping 3  Tired, decreased energy 3  Change in appetite 2  Feeling bad or failure about yourself  0  Trouble concentrating 0  Moving slowly or fidgety/restless 0  Suicidal thoughts 0  PHQ-9 Score 11  Difficult doing work/chores Not difficult at all   Subjective:   1. Other fatigue Hannah Short admits to daytime somnolence and reports waking up still tired. Patent has a history of symptoms of daytime fatigue, morning fatigue, morning headache and snoring sometimes. Hannah Short generally gets 4-6 hours of sleep per night, and states that she has poor quality sleep. Snoring is present. Apneic episodes are not present. Epworth Sleepiness Score is 14.  EKG - NSR with ?able LVH.  2. SOB (shortness of breath) on exertion Hannah Short notes increasing shortness of breath with exercising and seems to be worsening over time with weight gain. She notes getting out of breath sooner with activity than she used to. This has gotten worse recently. Hannah Short denies shortness of breath at rest or orthopnea.  3. Essential hypertension On amlodipine 5 mg.  Blood pressure controlled today.  No chest pain, chest pressure, headache.  4. Vitamin D deficiency No nausea, vomiting, muscle weakness.  She endorses fatigue.  She is on 1,000 IU daily.  5. NAFLD (nonalcoholic fatty liver disease) Last CMP showing LFTs within normal limits in early January.  Sees Gastroenterology (Dr. Hilarie Fredrickson).  6. Other depression Patient emotionally eating daily.  Daughter has spina bifida and has had 50+ surgeries and husband has  a history of cancer.  7. At risk for osteoporosis Hannah Short is at higher risk of osteopenia and osteoporosis due to Vitamin D deficiency.   Assessment/Plan:   1. Other fatigue Hannah Short does feel that her weight is causing her energy to be lower than it should be. Fatigue may be related to obesity, depression or many other causes. Labs will be ordered, and in the  meanwhile, Hannah Short will focus on self care including making healthy food choices, increasing physical activity and focusing on stress reduction.    - EKG 12-Lead - Vitamin B12 - Hemoglobin A1c - Insulin, random - T3 - T4  2. SOB (shortness of breath) on exertion Hannah Short does feel that she gets out of breath more easily that she used to when she exercises. Brit's shortness of breath appears to be obesity related and exercise induced. She has agreed to work on weight loss and gradually increase exercise to treat her exercise induced shortness of breath. Will continue to monitor closely.  - Lipid Panel With LDL/HDL Ratio  3. Essential hypertension Hannah Short is working on healthy weight loss and exercise to improve blood pressure control. We will watch for signs of hypotension as she continues her lifestyle modifications.  EKG and labs today.  4. Vitamin D deficiency Low Vitamin D level contributes to fatigue and are associated with obesity, breast, and colon cancer. Will check vitamin D level today.  - VITAMIN D 25 Hydroxy (Vit-D Deficiency, Fractures)  5. NAFLD (nonalcoholic fatty liver disease) We discussed the likely diagnosis of non-alcoholic fatty liver disease today and how this condition is obesity related. Hannah Short was educated the importance of weight loss. Hannah Short agreed to continue with her weight loss efforts with healthier diet and exercise as an essential part of her treatment plan.  Follow-up in 3 months.  6. Other depression Follow-up at next appointment.  7. At risk for osteoporosis Hannah Short was given approximately 15 minutes of osteoporosis prevention counseling today. Hannah Short is at risk for osteopenia and osteoporosis due to her Vitamin D deficiency. She was encouraged to take her Vitamin D and follow her higher calcium diet and increase strengthening exercise to help strengthen her bones and decrease her risk of osteopenia and osteoporosis.  Repetitive spaced learning  was employed today to elicit superior memory formation and behavioral change.  8. Class 1 obesity with serious comorbidity and body mass index (BMI) of 33.0 to 33.9 in adult, unspecified obesity type  Hannah Short is currently in the action stage of change and her goal is to continue with weight loss efforts. I recommend Hannah Short begin the structured treatment plan as follows:  She has agreed to the Category 2 Plan +100 calories.  Exercise goals: No exercise has been prescribed at this time.   Behavioral modification strategies: increasing lean protein intake, meal planning and cooking strategies, keeping healthy foods in the home and planning for success.  She was informed of the importance of frequent follow-up visits to maximize her success with intensive lifestyle modifications for her multiple health conditions. She was informed we would discuss her lab results at her next visit unless there is a critical issue that needs to be addressed sooner. Hannah Short agreed to keep her next visit at the agreed upon time to discuss these results.  Objective:   Blood pressure 119/68, pulse 68, temperature 97.8 F (36.6 C), temperature source Oral, height 5\' 4"  (1.626 m), weight 195 lb (88.5 kg), SpO2 97 %. Body mass index is 33.47 kg/m.  EKG: Normal sinus rhythm, rate  65 bpm.  Indirect Calorimeter completed today shows a VO2 of 239 and a REE of 1657.  Her calculated basal metabolic rate is [id number] thus her basal metabolic rate is better than expected.  General: Cooperative, alert, well developed, in no acute distress. HEENT: Conjunctivae and lids unremarkable. Cardiovascular: Regular rhythm.  Lungs: Normal work of breathing. Neurologic: No focal deficits.   Lab Results  Component Value Date   CREATININE 0.73 08/12/2020   BUN 12 08/12/2020   NA 141 08/12/2020   K 3.5 08/12/2020   CL 102 08/12/2020   CO2 33 (H) 08/12/2020   Lab Results  Component Value Date   ALT 24 08/12/2020   AST 18 08/12/2020    ALKPHOS 71 08/12/2020   BILITOT 0.5 08/12/2020   Lab Results  Component Value Date   HGBA1C 5.4 09/01/2020   Lab Results  Component Value Date   INSULIN 19.9 09/01/2020   Lab Results  Component Value Date   TSH 3.28 08/12/2020   Lab Results  Component Value Date   CHOL 168 09/01/2020   HDL 55 09/01/2020   LDLCALC 95 09/01/2020   TRIG 101 09/01/2020   Lab Results  Component Value Date   WBC 5.5 08/12/2020   HGB 14.0 08/12/2020   HCT 41.4 08/12/2020   MCV 90.0 08/12/2020   PLT 283.0 08/12/2020   Attestation Statements:   Reviewed by clinician on day of visit: allergies, medications, problem list, medical history, surgical history, family history, social history, and previous encounter notes.  This is the patient's first visit at [facility]. The patient's NEW PATIENT PACKET was reviewed at length. Included in the packet: current and past health history, medications, allergies, ROS, gynecologic history (women only), surgical history, family history, social history, weight history, weight loss surgery history (for those that have had weight loss surgery), nutritional evaluation, Short and food questionnaire, PHQ9, Epworth questionnaire, sleep habits questionnaire, patient life and health improvement goals questionnaire. These will all be scanned into the patient's chart under media.   During the visit, I independently reviewed the patient's EKG, bioimpedance scale results, and indirect calorimeter results. I used this information to tailor a meal plan for the patient that will help her to lose weight and will improve her obesity-related conditions going forward. I performed a medically necessary appropriate examination and/or evaluation. I discussed the assessment and treatment plan with the patient. The patient was provided an opportunity to ask questions and all were answered. The patient agreed with the plan and demonstrated an understanding of the instructions.  Labs were ordered at this visit and will be reviewed at the next visit unless more critical results need to be addressed immediately. Clinical information was updated and documented in the EMR.   Time spent on visit including pre-visit chart review and post-visit care was 45 minutes.   A separate 15 minutes was spent on risk counseling (see above).   I, Water quality scientist, CMA, am acting as transcriptionist for Coralie Common, MD. I have reviewed the above documentation for accuracy and completeness, and I agree with the above. - Jinny Blossom, MD

## 2020-09-15 ENCOUNTER — Encounter (INDEPENDENT_AMBULATORY_CARE_PROVIDER_SITE_OTHER): Payer: Self-pay | Admitting: Family Medicine

## 2020-09-15 ENCOUNTER — Other Ambulatory Visit: Payer: Self-pay

## 2020-09-15 ENCOUNTER — Ambulatory Visit (INDEPENDENT_AMBULATORY_CARE_PROVIDER_SITE_OTHER): Payer: BLUE CROSS/BLUE SHIELD | Admitting: Family Medicine

## 2020-09-15 VITALS — BP 119/73 | HR 66 | Temp 97.5°F | Ht 64.0 in | Wt 192.0 lb

## 2020-09-15 DIAGNOSIS — I10 Essential (primary) hypertension: Secondary | ICD-10-CM | POA: Diagnosis not present

## 2020-09-15 DIAGNOSIS — E669 Obesity, unspecified: Secondary | ICD-10-CM

## 2020-09-15 DIAGNOSIS — Z9189 Other specified personal risk factors, not elsewhere classified: Secondary | ICD-10-CM

## 2020-09-15 DIAGNOSIS — E559 Vitamin D deficiency, unspecified: Secondary | ICD-10-CM

## 2020-09-15 DIAGNOSIS — E8881 Metabolic syndrome: Secondary | ICD-10-CM | POA: Diagnosis not present

## 2020-09-15 DIAGNOSIS — Z6833 Body mass index (BMI) 33.0-33.9, adult: Secondary | ICD-10-CM

## 2020-09-15 MED ORDER — VITAMIN D 25 MCG (1000 UNIT) PO TABS: 50.0000 ug | ORAL_TABLET | Freq: Every day | ORAL | Status: DC

## 2020-09-16 NOTE — Progress Notes (Signed)
Chief Complaint:   OBESITY Hannah Short is here to discuss her progress with her obesity treatment plan along with follow-up of her obesity related diagnoses. Hannah Short is on the Category 2 Plan and states she is following her eating plan approximately 95% of the time. Hannah Short states she is doing more walking 6,000-7,000 steps 7 times per week.  Today's visit was #: 2 Starting weight: 195 lbs Starting date: 09/01/2020 Today's weight: 192 lbs Today's date: 09/15/2020 Total lbs lost to date: 3 lbs Total lbs lost since last in-office visit: 3 lbs  Interim History: Hannah Short reports that she likes the meal plan and wants to keep on the same plan. She is getting a new puppy soon, so there will be an increase in her activity soon. She states the quantity of food sufficient. She is enjoying the healthy choice meals. Pt is interested in incorporating bananas and oranges into the plan.  Subjective:   1. Vitamin D deficiency Pt denies nausea, vomiting, and muscle weakness but notes fatigue. Pt is on Vit D 1,000 units daily. Her last Vit D level was 40.3.  2. Insulin resistance Pt's A1c is 5.4 with an insulin level of 19.9. She is not on medication.  Lab Results  Component Value Date   INSULIN 19.9 09/01/2020   Lab Results  Component Value Date   HGBA1C 5.4 09/01/2020    3. Essential hypertension Pt is on amlodipine 5 mg daily. Pt denies chest pain, chest pressure and headache.  BP Readings from Last 3 Encounters:  09/15/20 119/73  09/01/20 119/68  08/12/20 104/68    4. At risk for osteoporosis Hannah Short is at higher risk of osteopenia and osteoporosis due to Vitamin D deficiency.   Assessment/Plan:   1. Vitamin D deficiency Low Vitamin D level contributes to fatigue and are associated with obesity, breast, and colon cancer. She agrees to increase Vit D supplementation to 2,000 units daily (up from 1,000 units) and will follow-up for routine testing of Vitamin D, at least 2-3 times per  year to avoid over-replacement.  2. Insulin resistance Hannah Short will continue to work on weight loss, exercise, and decreasing simple carbohydrates to help decrease the risk of diabetes. Hannah Short agreed to follow-up with [place] as directed to closely monitor her progress.check labs in 3 months.  3. Essential hypertension Hannah Short is working on healthy weight loss and exercise to improve blood pressure control. We will watch for signs of hypotension as she continues her lifestyle modifications. Continue amlodipine. We will taper as tolerated at future appointment.   4. At risk for osteoporosis Hannah Short was given approximately 15 minutes of osteoporosis prevention counseling today. Hannah Short is at risk for osteopenia and osteoporosis due to her Vitamin D deficiency. She was encouraged to take her Vitamin D and follow her higher calcium diet and increase strengthening exercise to help strengthen her bones and decrease her risk of osteopenia and osteoporosis.  Repetitive spaced learning was employed today to elicit superior memory formation and behavioral change.  5. Class 1 obesity with serious comorbidity and body mass index (BMI) of 33.0 to 33.9 in adult, unspecified obesity type Hannah Short is currently in the action stage of change. As such, her goal is to continue with weight loss efforts. She has agreed to the Category 2 Plan.   Exercise goals: All adults should avoid inactivity. Some physical activity is better than none, and adults who participate in any amount of physical activity gain some health benefits.  Behavioral modification strategies: increasing lean protein  intake, meal planning and cooking strategies, keeping healthy foods in the home and planning for success.  Hannah Short has agreed to follow-up with our clinic in 2 weeks. She was informed of the importance of frequent follow-up visits to maximize her success with intensive lifestyle modifications for her multiple health conditions.    Objective:   Blood pressure 119/73, pulse 66, temperature (!) 97.5 F (36.4 C), temperature source Oral, height 5\' 4"  (1.626 m), weight 192 lb (87.1 kg), SpO2 98 %. Body mass index is 32.96 kg/m.  General: Cooperative, alert, well developed, in no acute distress. HEENT: Conjunctivae and lids unremarkable. Cardiovascular: Regular rhythm.  Lungs: Normal work of breathing. Neurologic: No focal deficits.   Lab Results  Component Value Date   CREATININE 0.73 08/12/2020   BUN 12 08/12/2020   NA 141 08/12/2020   K 3.5 08/12/2020   CL 102 08/12/2020   CO2 33 (H) 08/12/2020   Lab Results  Component Value Date   ALT 24 08/12/2020   AST 18 08/12/2020   ALKPHOS 71 08/12/2020   BILITOT 0.5 08/12/2020   Lab Results  Component Value Date   HGBA1C 5.4 09/01/2020   Lab Results  Component Value Date   INSULIN 19.9 09/01/2020   Lab Results  Component Value Date   TSH 3.28 08/12/2020   Lab Results  Component Value Date   CHOL 168 09/01/2020   HDL 55 09/01/2020   LDLCALC 95 09/01/2020   TRIG 101 09/01/2020   Lab Results  Component Value Date   WBC 5.5 08/12/2020   HGB 14.0 08/12/2020   HCT 41.4 08/12/2020   MCV 90.0 08/12/2020   PLT 283.0 08/12/2020    Attestation Statements:   Reviewed by clinician on day of visit: allergies, medications, problem list, medical history, surgical history, family history, social history, and previous encounter notes.  Coral Ceo, am acting as transcriptionist for Coralie Common, MD.   I have reviewed the above documentation for accuracy and completeness, and I agree with the above. - Jinny Blossom, MD

## 2020-10-01 ENCOUNTER — Ambulatory Visit: Payer: BLUE CROSS/BLUE SHIELD | Admitting: Cardiovascular Disease

## 2020-10-07 ENCOUNTER — Encounter (INDEPENDENT_AMBULATORY_CARE_PROVIDER_SITE_OTHER): Payer: Self-pay | Admitting: Family Medicine

## 2020-10-07 ENCOUNTER — Ambulatory Visit (INDEPENDENT_AMBULATORY_CARE_PROVIDER_SITE_OTHER): Payer: BLUE CROSS/BLUE SHIELD | Admitting: Family Medicine

## 2020-10-07 ENCOUNTER — Other Ambulatory Visit: Payer: Self-pay

## 2020-10-07 VITALS — BP 108/72 | HR 74 | Temp 98.0°F | Ht 64.0 in | Wt 191.0 lb

## 2020-10-07 DIAGNOSIS — Z9189 Other specified personal risk factors, not elsewhere classified: Secondary | ICD-10-CM

## 2020-10-07 DIAGNOSIS — E8881 Metabolic syndrome: Secondary | ICD-10-CM

## 2020-10-07 DIAGNOSIS — I10 Essential (primary) hypertension: Secondary | ICD-10-CM

## 2020-10-07 DIAGNOSIS — E669 Obesity, unspecified: Secondary | ICD-10-CM

## 2020-10-07 DIAGNOSIS — Z6832 Body mass index (BMI) 32.0-32.9, adult: Secondary | ICD-10-CM

## 2020-10-07 MED ORDER — METFORMIN HCL 500 MG PO TABS
500.0000 mg | ORAL_TABLET | Freq: Every day | ORAL | 0 refills | Status: DC
Start: 2020-10-07 — End: 2020-10-23

## 2020-10-07 MED ORDER — AMLODIPINE BESYLATE 5 MG PO TABS: 2.5000 mg | ORAL_TABLET | Freq: Every day | ORAL | 0 refills | Status: DC

## 2020-10-09 NOTE — Progress Notes (Signed)
Chief Complaint:   OBESITY Hannah Short is here to discuss her progress with her obesity treatment plan along with follow-up of her obesity related diagnoses. Hannah Short is on the Category 2 Plan and states she is following her eating plan approximately 85-90% of the time. Hannah Short states she is using a pedal bike 20 minutes 3 times per week.  Today's visit was #: 3 Starting weight: 195 lbs Starting date: 09/01/2020 Today's weight: 191 lbs Today's date: 10/07/2020 Total lbs lost to date: 4 lbs Total lbs lost since last in-office visit: 1 lb  Interim History: Pt voices she has lost about 4 inches in her abdomen since October 2021. She is following the plan about 90%. She is doing slimfast or Atkins shake in the morning as her breakfast. She is sleeping better nightly as well. For lunch pt is doing microwave meals. She is doing babybell cheese for snack. Pt is getting a new puppy in the next few weeks.  Subjective:   1. Essential hypertension Pt's BP is controlled today (even low). She is on amlodipine 5 mg.  BP Readings from Last 3 Encounters:  10/07/20 108/72  09/15/20 119/73  09/01/20 119/68    2. Insulin resistance Pt's 09/01/2020 A1c is 5.4 and insulin level 19.9.  Lab Results  Component Value Date   INSULIN 19.9 09/01/2020   Lab Results  Component Value Date   HGBA1C 5.4 09/01/2020    3. At risk of diabetes mellitus Hannah Short is at higher than average risk for developing diabetes due to obesity.   Assessment/Plan:   1. Essential hypertension Hannah Short is working on healthy weight loss and exercise to improve blood pressure control. We will watch for signs of hypotension as she continues her lifestyle modifications. Decrease amlodipine to 2.5 mg daily. - amLODipine (NORVASC) 5 MG tablet; Take 0.5 tablets (2.5 mg total) by mouth daily.  Dispense: 30 tablet; Refill: 0  2. Insulin resistance Hannah Short will continue to work on weight loss, exercise, and decreasing simple  carbohydrates to help decrease the risk of diabetes. Hannah Short agreed to follow-up with [place] as directed to closely monitor her progress. Start Metformin 500 mg, as per below.  - metFORMIN (GLUCOPHAGE) 500 MG tablet; Take 1 tablet (500 mg total) by mouth daily with breakfast.  Dispense: 30 tablet; Refill: 0  3. At risk of diabetes mellitus Hannah Short was given approximately 15 minutes of diabetes education and counseling today. We discussed intensive lifestyle modifications today with an emphasis on weight loss as well as increasing exercise and decreasing simple carbohydrates in her diet. We also reviewed medication options with an emphasis on risk versus benefit of those discussed.   Repetitive spaced learning was employed today to elicit superior memory formation and behavioral change.  4. Class 1 obesity with serious comorbidity and body mass index (BMI) of 32.0 to 32.9 in adult, unspecified obesity type Hannah Short is currently in the action stage of change. As such, her goal is to continue with weight loss efforts. She has agreed to the Category 2 Plan.   Exercise goals: All adults should avoid inactivity. Some physical activity is better than none, and adults who participate in any amount of physical activity gain some health benefits.  Behavioral modification strategies: increasing lean protein intake, meal planning and cooking strategies, keeping healthy foods in the home and planning for success.  Hannah Short has agreed to follow-up with our clinic in 2 weeks. She was informed of the importance of frequent follow-up visits to maximize her success with  intensive lifestyle modifications for her multiple health conditions.   Objective:   Blood pressure 108/72, pulse 74, temperature 98 F (36.7 C), temperature source Oral, height 5\' 4"  (1.626 m), weight 191 lb (86.6 kg), SpO2 96 %. Body mass index is 32.79 kg/m.  General: Cooperative, alert, well developed, in no acute distress. HEENT: Conjunctivae  and lids unremarkable. Cardiovascular: Regular rhythm.  Lungs: Normal work of breathing. Neurologic: No focal deficits.   Lab Results  Component Value Date   CREATININE 0.73 08/12/2020   BUN 12 08/12/2020   NA 141 08/12/2020   K 3.5 08/12/2020   CL 102 08/12/2020   CO2 33 (H) 08/12/2020   Lab Results  Component Value Date   ALT 24 08/12/2020   AST 18 08/12/2020   ALKPHOS 71 08/12/2020   BILITOT 0.5 08/12/2020   Lab Results  Component Value Date   HGBA1C 5.4 09/01/2020   Lab Results  Component Value Date   INSULIN 19.9 09/01/2020   Lab Results  Component Value Date   TSH 3.28 08/12/2020   Lab Results  Component Value Date   CHOL 168 09/01/2020   HDL 55 09/01/2020   LDLCALC 95 09/01/2020   TRIG 101 09/01/2020   Lab Results  Component Value Date   WBC 5.5 08/12/2020   HGB 14.0 08/12/2020   HCT 41.4 08/12/2020   MCV 90.0 08/12/2020   PLT 283.0 08/12/2020    Attestation Statements:   Reviewed by clinician on day of visit: allergies, medications, problem list, medical history, surgical history, family history, social history, and previous encounter notes.  Coral Ceo, am acting as transcriptionist for Coralie Common, MD.   I have reviewed the above documentation for accuracy and completeness, and I agree with the above. - Jinny Blossom, MD

## 2020-10-16 ENCOUNTER — Encounter (INDEPENDENT_AMBULATORY_CARE_PROVIDER_SITE_OTHER): Payer: Self-pay | Admitting: Family Medicine

## 2020-10-16 NOTE — Telephone Encounter (Signed)
Please advise 

## 2020-10-23 ENCOUNTER — Encounter (INDEPENDENT_AMBULATORY_CARE_PROVIDER_SITE_OTHER): Payer: Self-pay | Admitting: Family Medicine

## 2020-10-23 ENCOUNTER — Other Ambulatory Visit: Payer: Self-pay

## 2020-10-23 ENCOUNTER — Ambulatory Visit (INDEPENDENT_AMBULATORY_CARE_PROVIDER_SITE_OTHER): Payer: BLUE CROSS/BLUE SHIELD | Admitting: Family Medicine

## 2020-10-23 VITALS — BP 122/77 | HR 69 | Temp 97.9°F | Ht 64.0 in | Wt 189.0 lb

## 2020-10-23 DIAGNOSIS — I10 Essential (primary) hypertension: Secondary | ICD-10-CM | POA: Diagnosis not present

## 2020-10-23 DIAGNOSIS — E669 Obesity, unspecified: Secondary | ICD-10-CM

## 2020-10-23 DIAGNOSIS — Z6833 Body mass index (BMI) 33.0-33.9, adult: Secondary | ICD-10-CM | POA: Diagnosis not present

## 2020-10-23 DIAGNOSIS — Z9189 Other specified personal risk factors, not elsewhere classified: Secondary | ICD-10-CM | POA: Diagnosis not present

## 2020-10-23 DIAGNOSIS — E8881 Metabolic syndrome: Secondary | ICD-10-CM

## 2020-10-23 MED ORDER — METFORMIN HCL 500 MG PO TABS: 500.0000 mg | ORAL_TABLET | Freq: Every day | ORAL | 0 refills | Status: DC

## 2020-10-28 NOTE — Progress Notes (Signed)
Chief Complaint:   OBESITY Hannah Short is here to discuss her progress with her obesity treatment plan along with follow-up of her obesity related diagnoses. Hannah Short is on the Category 2 Plan and states she is following her eating plan approximately 90% of the time. Hannah Short states she is walking 2 miles 3 times a week and using stationary bike 10 minutes 7 times per week.  Today's visit was #: 4 Starting weight: 195 lbs Starting date: 09/01/2020 Today's weight: 189 lbs Today's date: 10/23/2020 Total lbs lost to date: 6 lbs Total lbs lost since last in-office visit: 2 lbs  Interim History: Hannah Short has been following category 2 plan most of the time and is starting to feel frustrated with weight loss. She has plans on Sept 17 for her grandchild's wedding. She is wondering about more aggressive options. She reports cravings after dinner for chips. She denies hunger.  Subjective:   1. Insulin resistance Hannah Short's last A1c was 5.4 with an insulin level of 19.9. She is on Metformin.  Lab Results  Component Value Date   INSULIN 19.9 09/01/2020   Lab Results  Component Value Date   HGBA1C 5.4 09/01/2020    2. Essential hypertension Hannah Short's BP is well controlled. She is on amlodipine 5 mg.  BP Readings from Last 3 Encounters:  10/23/20 122/77  10/07/20 108/72  09/15/20 119/73    3. At risk for osteoporosis Hannah Short is at higher risk of osteopenia and osteoporosis due to Vitamin D deficiency.   Assessment/Plan:   1. Insulin resistance Hannah Short will continue to work on weight loss, exercise, and decreasing simple carbohydrates to help decrease the risk of diabetes. Hannah Short agreed to follow-up with [place] as directed to closely monitor her progress.   - metFORMIN (GLUCOPHAGE) 500 MG tablet; Take 1 tablet (500 mg total) by mouth daily with breakfast.  Dispense: 30 tablet; Refill: 0  2. Essential hypertension Hannah Short is working on healthy weight loss and exercise to improve blood  pressure control. We will watch for signs of hypotension as she continues her lifestyle modifications. Continue amlodipine. Follow up with BP at next appointment in hopes of decreasing meds.  3. At risk for osteoporosis Hannah Short was given approximately 15 minutes of osteoporosis prevention counseling today. Hannah Short is at risk for osteopenia and osteoporosis due to her Vitamin D deficiency. She was encouraged to take her Vitamin D and follow her higher calcium diet and increase strengthening exercise to help strengthen her bones and decrease her risk of osteopenia and osteoporosis.  Repetitive spaced learning was employed today to elicit superior memory formation and behavioral change.  4. Obesity with current BMI of 32.5 Hannah Short is currently in the action stage of change. As such, her goal is to continue with weight loss efforts. She has agreed to the Category 2 Plan and keeping a food journal and adhering to recommended goals of 1200-1300 calories and 85+ g protein.   Exercise goals: As is- resistance training15-20 minutes 3-4 times a week.  Behavioral modification strategies: increasing lean protein intake, meal planning and cooking strategies, keeping healthy foods in the home and planning for success.  Hannah Short has agreed to follow-up with our clinic in 2 weeks. She was informed of the importance of frequent follow-up visits to maximize her success with intensive lifestyle modifications for her multiple health conditions.   Objective:   Blood pressure 122/77, pulse 69, temperature 97.9 F (36.6 C), temperature source Oral, height 5\' 4"  (1.626 m), weight 189 lb (85.7 kg), SpO2 99 %.  Body mass index is 32.44 kg/m.  General: Cooperative, alert, well developed, in no acute distress. HEENT: Conjunctivae and lids unremarkable. Cardiovascular: Regular rhythm.  Lungs: Normal work of breathing. Neurologic: No focal deficits.   Lab Results  Component Value Date   CREATININE 0.73 08/12/2020    BUN 12 08/12/2020   NA 141 08/12/2020   K 3.5 08/12/2020   CL 102 08/12/2020   CO2 33 (H) 08/12/2020   Lab Results  Component Value Date   ALT 24 08/12/2020   AST 18 08/12/2020   ALKPHOS 71 08/12/2020   BILITOT 0.5 08/12/2020   Lab Results  Component Value Date   HGBA1C 5.4 09/01/2020   Lab Results  Component Value Date   INSULIN 19.9 09/01/2020   Lab Results  Component Value Date   TSH 3.28 08/12/2020   Lab Results  Component Value Date   CHOL 168 09/01/2020   HDL 55 09/01/2020   LDLCALC 95 09/01/2020   TRIG 101 09/01/2020   Lab Results  Component Value Date   WBC 5.5 08/12/2020   HGB 14.0 08/12/2020   HCT 41.4 08/12/2020   MCV 90.0 08/12/2020   PLT 283.0 08/12/2020    Attestation Statements:   Reviewed by clinician on day of visit: allergies, medications, problem list, medical history, surgical history, family history, social history, and previous encounter notes.  Coral Ceo, am acting as transcriptionist for Coralie Common, MD.   I have reviewed the above documentation for accuracy and completeness, and I agree with the above. - Jinny Blossom, MD

## 2020-11-10 DIAGNOSIS — Z6832 Body mass index (BMI) 32.0-32.9, adult: Secondary | ICD-10-CM | POA: Diagnosis not present

## 2020-11-10 DIAGNOSIS — R300 Dysuria: Secondary | ICD-10-CM | POA: Diagnosis not present

## 2020-11-11 ENCOUNTER — Ambulatory Visit (INDEPENDENT_AMBULATORY_CARE_PROVIDER_SITE_OTHER): Payer: BLUE CROSS/BLUE SHIELD | Admitting: Family Medicine

## 2020-11-11 ENCOUNTER — Encounter (INDEPENDENT_AMBULATORY_CARE_PROVIDER_SITE_OTHER): Payer: Self-pay | Admitting: Family Medicine

## 2020-11-11 ENCOUNTER — Other Ambulatory Visit: Payer: Self-pay

## 2020-11-11 VITALS — BP 132/84 | HR 74 | Temp 98.1°F | Ht 64.0 in | Wt 187.0 lb

## 2020-11-11 DIAGNOSIS — I10 Essential (primary) hypertension: Secondary | ICD-10-CM | POA: Diagnosis not present

## 2020-11-11 DIAGNOSIS — Z6832 Body mass index (BMI) 32.0-32.9, adult: Secondary | ICD-10-CM | POA: Diagnosis not present

## 2020-11-11 DIAGNOSIS — E669 Obesity, unspecified: Secondary | ICD-10-CM | POA: Diagnosis not present

## 2020-11-11 DIAGNOSIS — E8881 Metabolic syndrome: Secondary | ICD-10-CM | POA: Diagnosis not present

## 2020-11-14 NOTE — Progress Notes (Signed)
Chief Complaint:   OBESITY Hannah Short is here to discuss her progress with her obesity treatment plan along with follow-up of her obesity related diagnoses. Hannah Short is on the Category 2 Plan and states she is following her eating plan approximately 85% of the time. Hannah Short states she is walking 15 minutes 3 times per week.  Today's visit was #: 5 Starting weight: 195 lbs Starting date: 09/01/2020 Today's weight: 187 lbs Today's date: 11/11/2020 Total lbs lost to date: 8 Total lbs lost since last in-office visit: 2  Interim History: Hannah Short is having significant decline in appetite due to diarrhea and is worried about Metformin causation. She was diagnosed with UTI yesterday and now on Nitrofurantoin and AZO. She is doing protein shake and oatmeal for breakfast; McChicken and tea for lunch; dinner is broiled fish or scallops and vegetables. She is doing very well with controlling her snacking at night. She is getting ~1200 cal/day and 60-80 g protein daily. Pt is going away for 2 days with her husband.  Subjective:   1. Essential hypertension Ilka's BP at home is high 749'S-496'P systolically. She is on amlodipine 2.5 mg.  BP Readings from Last 3 Encounters:  11/11/20 132/84  10/23/20 122/77  10/07/20 108/72    2. Insulin resistance Hannah Short tried Metformin but couldn't tolerate due to very significant diarrhea. Her last A1c 5.4 and insulin level 19.9.  Lab Results  Component Value Date   INSULIN 19.9 09/01/2020   Lab Results  Component Value Date   HGBA1C 5.4 09/01/2020    Assessment/Plan:   1. Essential hypertension Hannah Short is working on healthy weight loss and exercise to improve blood pressure control. We will watch for signs of hypotension as she continues her lifestyle modifications. Continue 2.5 mg amlodipine with no change in dose.  2. Insulin resistance Hannah Short will continue to work on weight loss, exercise, and decreasing simple carbohydrates to help decrease  the risk of diabetes. Hannah Short agreed to follow-up with [place] as directed to closely monitor her progress. Stop Metformin.  3. Class 1 obesity with serious comorbidity and body mass index (BMI) of 32.0 to 32.9 in adult, unspecified obesity type Hannah Short is currently in the action stage of change. As such, her goal is to continue with weight loss efforts. She has agreed to the Category 2 Plan and keeping a food journal and adhering to recommended goals of up to 1200 calories and 80+ g protein.   Exercise goals: Band exercises 10 minutes 3 times a week.  Behavioral modification strategies: increasing lean protein intake, meal planning and cooking strategies, keeping healthy foods in the home and planning for success.  Hannah Short has agreed to follow-up with our clinic in 2-3 weeks. She was informed of the importance of frequent follow-up visits to maximize her success with intensive lifestyle modifications for her multiple health conditions.   Objective:   Blood pressure 132/84, pulse 74, temperature 98.1 F (36.7 C), height 5\' 4"  (1.626 m), weight 187 lb (84.8 kg), SpO2 97 %. Body mass index is 32.1 kg/m.  General: Cooperative, alert, well developed, in no acute distress. HEENT: Conjunctivae and lids unremarkable. Cardiovascular: Regular rhythm.  Lungs: Normal work of breathing. Neurologic: No focal deficits.   Lab Results  Component Value Date   CREATININE 0.73 08/12/2020   BUN 12 08/12/2020   NA 141 08/12/2020   K 3.5 08/12/2020   CL 102 08/12/2020   CO2 33 (H) 08/12/2020   Lab Results  Component Value Date   ALT  24 08/12/2020   AST 18 08/12/2020   ALKPHOS 71 08/12/2020   BILITOT 0.5 08/12/2020   Lab Results  Component Value Date   HGBA1C 5.4 09/01/2020   Lab Results  Component Value Date   INSULIN 19.9 09/01/2020   Lab Results  Component Value Date   TSH 3.28 08/12/2020   Lab Results  Component Value Date   CHOL 168 09/01/2020   HDL 55 09/01/2020   LDLCALC 95  09/01/2020   TRIG 101 09/01/2020   Lab Results  Component Value Date   WBC 5.5 08/12/2020   HGB 14.0 08/12/2020   HCT 41.4 08/12/2020   MCV 90.0 08/12/2020   PLT 283.0 08/12/2020    Attestation Statements:   Reviewed by clinician on day of visit: allergies, medications, problem list, medical history, surgical history, family history, social history, and previous encounter notes.  Time spent on visit including pre-visit chart review and post-visit care and charting was 15 minutes.   Coral Ceo, am acting as transcriptionist for Coralie Common, MD.   I have reviewed the above documentation for accuracy and completeness, and I agree with the above. - Jinny Blossom, MD

## 2020-11-26 ENCOUNTER — Ambulatory Visit (INDEPENDENT_AMBULATORY_CARE_PROVIDER_SITE_OTHER): Payer: BLUE CROSS/BLUE SHIELD | Admitting: Cardiovascular Disease

## 2020-11-26 ENCOUNTER — Other Ambulatory Visit: Payer: Self-pay

## 2020-11-26 ENCOUNTER — Encounter: Payer: Self-pay | Admitting: Cardiovascular Disease

## 2020-11-26 DIAGNOSIS — I10 Essential (primary) hypertension: Secondary | ICD-10-CM | POA: Diagnosis not present

## 2020-11-26 DIAGNOSIS — R0609 Other forms of dyspnea: Secondary | ICD-10-CM

## 2020-11-26 DIAGNOSIS — R0600 Dyspnea, unspecified: Secondary | ICD-10-CM

## 2020-11-26 DIAGNOSIS — R0789 Other chest pain: Secondary | ICD-10-CM

## 2020-11-26 NOTE — Patient Instructions (Signed)

## 2020-11-26 NOTE — Assessment & Plan Note (Signed)
History of atypical chest pain with past with coronary calcium score of 0.  She no longer has chest pain.

## 2020-11-26 NOTE — Progress Notes (Signed)
11/26/2020 Hannah Short   1955-08-16  [id number]  Primary Physician Lanelle Bal, PA-C Primary Cardiologist: Lorretta Harp MD Hannah Short, [place]  HPI:  Hannah Short is a 65 y.o.   moderately overweight married Caucasian female mother of 5 children, grandmother of 52 grandchildren and great grandmother of 3 grandchildren who is referred by Lanelle Bal, PA-C, her PCP, for evaluation of atypical chest pain and dyspnea. I have taken care of her pastor Hannah Short, her mother Hannah Short and her brother Hannah Short. She works as a [name] and takes care of her daughter who has spina bifida.  I last saw her in the office 03/04/2020.  She has no cardiac risk factors. She is never had a heart attack or stroke. She has had a Nissen fundoplication 24 years ago for hiatal hernia. She also has GERD and is on a PPI which somewhat improves her chest pain. She is also noticed increasing dyspnea over the last month.  Since I saw her 8 months ago she feels clinically improved.  She did have a normal 2D echo and a coronary calcium score of 0.  I did refer her to weight loss center and she is motivated to continue to lose weight.  She is lost 5 pounds and anticipates losing an additional 20.  She denies chest pain or shortness of breath.   Current Meds  Medication Sig  . amLODipine (NORVASC) 5 MG tablet Take 0.5 tablets (2.5 mg total) by mouth daily.  . Chlorphen-Pseudoephed-APAP (CORICIDIN D PO) Take by mouth.  . cholecalciferol (VITAMIN D3) 25 MCG (1000 UNIT) tablet Take 2 tablets (50 mcg total) by mouth daily.  Marland Kitchen dicyclomine (BENTYL) 10 MG capsule Take 10 mg by mouth as needed.  . fexofenadine (ALLEGRA) 180 MG tablet Take 180 mg by mouth daily as needed.  . fluorometholone (FML) 0.1 % ophthalmic suspension 1 drop 3 (three) times daily.  Marland Kitchen gabapentin (NEURONTIN) 300 MG capsule Take 300 mg by mouth in the morning and at bedtime.  . nitrofurantoin, macrocrystal-monohydrate,  (MACROBID) 100 MG capsule Take 100 mg by mouth 2 (two) times daily.  Marland Kitchen omeprazole (PRILOSEC) 40 MG capsule Take 40 mg by mouth daily.  . RESTASIS 0.05 % ophthalmic emulsion 1 drop 2 (two) times daily.  . Simethicone 125 MG CAPS Take by mouth as needed.     Allergies  Allergen Reactions  . Topamax [Topiramate] Other (See Comments)    Pt.states makes legs go numb  . Betanidine [Bethanidine]   . Clonazepam     BALANCE ISSUE  . Flagyl [Metronidazole]   . Sulfa Antibiotics     Social History   Socioeconomic History  . Marital status: Married    Spouse name: Hannah Short  . Number of children: 5  . Years of education: 75  . Highest education level: Not on file  Occupational History  . Occupation: CNA  Tobacco Use  . Smoking status: Never Smoker  . Smokeless tobacco: Never Used  Substance and Sexual Activity  . Alcohol use: No  . Drug use: No  . Sexual activity: Not on file  Other Topics Concern  . Not on file  Social History Narrative   Patient lives at home with her husband Hannah Short) and father in law and Hannah Short her daughter.   Patient is taking care of her father full time and disabled daughter.   Education some college.    Right handed.   Caffeine one cup of coffee daily.  Pt states she is increasing her water intake.    Drinks decaf tea.       Social Determinants of Health   Financial Resource Strain: Not on file  Food Insecurity: Not on file  Transportation Needs: Not on file  Physical Activity: Not on file  Stress: Not on file  Social Connections: Not on file  Intimate Partner Violence: Not on file     Review of Systems: General: negative for chills, fever, night sweats or weight changes.  Cardiovascular: negative for chest pain, dyspnea on exertion, edema, orthopnea, palpitations, paroxysmal nocturnal dyspnea or shortness of breath Dermatological: negative for rash Respiratory: negative for cough or wheezing Urologic: negative for hematuria Abdominal:  negative for nausea, vomiting, diarrhea, bright red blood per rectum, melena, or hematemesis Neurologic: negative for visual changes, syncope, or dizziness All other systems reviewed and are otherwise negative except as noted above.    Blood pressure 124/80, pulse 64, height 5\' 4"  (1.626 m), weight 189 lb (85.7 kg).  General appearance: alert and no distress Neck: no adenopathy, no carotid bruit, no JVD, supple, symmetrical, trachea midline and thyroid not enlarged, symmetric, no tenderness/mass/nodules Lungs: clear to auscultation bilaterally Heart: regular rate and rhythm, S1, S2 normal, no murmur, click, rub or gallop Extremities: extremities normal, atraumatic, no cyanosis or edema Pulses: 2+ and symmetric Skin: Skin color, texture, turgor normal. No rashes or lesions Neurologic: Alert and oriented X 3, normal strength and tone. Normal symmetric reflexes. Normal coordination and gait  EKG not performed today  ASSESSMENT AND PLAN:   Atypical chest pain History of atypical chest pain with past with coronary calcium score of 0.  She no longer has chest pain.  Dyspnea on exertion History of dyspnea on exertion with normal 2D echo.  I suspect a lot of this is related to obesity and deconditioning.  Her symptoms have somewhat improved.  Essential hypertension History of essential hypertension blood pressure measured today 124/80.  She is on low-dose amlodipine.  Morbid obesity ([place]) History of obesity currently seeing diet and weight loss specialist.  She is lost at least 5 pounds with a goal of losing additional 20 pounds.  I suspect that when she achieves her target weight of 170 pounds she will be able to come off of her antihypertensive medications.      Lorretta Harp MD FACP,FACC,FAHA, [facility] 11/26/2020 12:10 PM

## 2020-11-26 NOTE — Assessment & Plan Note (Signed)
History of dyspnea on exertion with normal 2D echo.  I suspect a lot of this is related to obesity and deconditioning.  Her symptoms have somewhat improved.

## 2020-11-26 NOTE — Assessment & Plan Note (Signed)
History of obesity currently seeing diet and weight loss specialist.  She is lost at least 5 pounds with a goal of losing additional 20 pounds.  I suspect that when she achieves her target weight of 170 pounds she will be able to come off of her antihypertensive medications.

## 2020-11-26 NOTE — Assessment & Plan Note (Signed)
History of essential hypertension blood pressure measured today 124/80.  She is on low-dose amlodipine.

## 2020-12-04 ENCOUNTER — Other Ambulatory Visit: Payer: Self-pay

## 2020-12-04 ENCOUNTER — Ambulatory Visit (INDEPENDENT_AMBULATORY_CARE_PROVIDER_SITE_OTHER): Payer: BLUE CROSS/BLUE SHIELD | Admitting: Physician Assistant

## 2020-12-04 VITALS — BP 119/74 | HR 64 | Temp 97.9°F | Ht 64.0 in | Wt 186.0 lb

## 2020-12-04 DIAGNOSIS — E8881 Metabolic syndrome: Secondary | ICD-10-CM | POA: Diagnosis not present

## 2020-12-04 DIAGNOSIS — Z6832 Body mass index (BMI) 32.0-32.9, adult: Secondary | ICD-10-CM

## 2020-12-04 DIAGNOSIS — I10 Essential (primary) hypertension: Secondary | ICD-10-CM

## 2020-12-04 DIAGNOSIS — E669 Obesity, unspecified: Secondary | ICD-10-CM | POA: Diagnosis not present

## 2020-12-04 NOTE — Progress Notes (Signed)
Chief Complaint:   OBESITY Neoma Laming is here to discuss her progress with her obesity treatment plan along with follow-up of her obesity related diagnoses. Neoma Laming is on the Category 2 Plan or keeping a food journal and adhering to recommended goals of 1200 calories and 80+ grams of protein daily and states she is following her eating plan approximately 85-90% of the time. Neoma Laming states she is riding the stationary bike for 20 minutes 7 times per week.  Today's visit was #: 6 Starting weight: 195 lbs Starting date: 09/01/2020 Today's weight: 186 lbs Today's date: 12/04/2020 Total lbs lost to date: 9 Total lbs lost since last in-office visit: 1  Interim History: Neoma Laming is frustrated with the slowness of her weight loss. She is eating out often and not drinking enough water.  Subjective:   1. Insulin resistance Neoma Laming is not on medications currently. Last A1c was 19.9. She could not tolerate metformin.  2. Essential hypertension Neoma Laming is followed by Dr. Gwenlyn Found. Her blood pressure today is in normal range.  Assessment/Plan:   1. Insulin resistance Neoma Laming will change to the lower carbohydrate plan, and she will consider GLP-1. She will continue to work on weight loss, exercise, and decreasing simple carbohydrates to help decrease the risk of diabetes. Neoma Laming agreed to follow-up with [place] as directed to closely monitor her progress.  2. Essential hypertension Neoma Laming will continue to follow up with Dr. Gwenlyn Found, and will continue working on healthy weight loss and exercise to improve blood pressure control. We will watch for signs of hypotension as she continues her lifestyle modifications.  3. Class 1 obesity with serious comorbidity and body mass index (BMI) of 32.0 to 32.9 in adult, unspecified obesity type Neoma Laming is currently in the action stage of change. As such, her goal is to continue with weight loss efforts. She has agreed to following a lower carbohydrate, vegetable and  lean protein rich diet plan.   Exercise goals: As is.  Behavioral modification strategies: increasing water intake and decreasing eating out.  Neoma Laming has agreed to follow-up with our clinic in 2 weeks. She was informed of the importance of frequent follow-up visits to maximize her success with intensive lifestyle modifications for her multiple health conditions.   Objective:   Blood pressure 119/74, pulse 64, temperature 97.9 F (36.6 C), height 5\' 4"  (1.626 m), weight 186 lb (84.4 kg), SpO2 98 %. Body mass index is 31.93 kg/m.  General: Cooperative, alert, well developed, in no acute distress. HEENT: Conjunctivae and lids unremarkable. Cardiovascular: Regular rhythm.  Lungs: Normal work of breathing. Neurologic: No focal deficits.   Lab Results  Component Value Date   CREATININE 0.73 08/12/2020   BUN 12 08/12/2020   NA 141 08/12/2020   K 3.5 08/12/2020   CL 102 08/12/2020   CO2 33 (H) 08/12/2020   Lab Results  Component Value Date   ALT 24 08/12/2020   AST 18 08/12/2020   ALKPHOS 71 08/12/2020   BILITOT 0.5 08/12/2020   Lab Results  Component Value Date   HGBA1C 5.4 09/01/2020   Lab Results  Component Value Date   INSULIN 19.9 09/01/2020   Lab Results  Component Value Date   TSH 3.28 08/12/2020   Lab Results  Component Value Date   CHOL 168 09/01/2020   HDL 55 09/01/2020   LDLCALC 95 09/01/2020   TRIG 101 09/01/2020   Lab Results  Component Value Date   WBC 5.5 08/12/2020   HGB 14.0 08/12/2020   HCT  41.4 08/12/2020   MCV 90.0 08/12/2020   PLT 283.0 08/12/2020   No results found for: IRON, TIBC, FERRITIN  Attestation Statements:   Reviewed by clinician on day of visit: allergies, medications, problem list, medical history, surgical history, family history, social history, and previous encounter notes.  Time spent on visit including pre-visit chart review and post-visit care and charting was 45 minutes.    Wilhemena Durie, am acting as  transcriptionist for [employer], PA-C.  I have reviewed the above documentation for accuracy and completeness, and I agree with the above. Abby Potash, PA-C

## 2020-12-10 ENCOUNTER — Other Ambulatory Visit: Payer: Self-pay | Admitting: Cardiovascular Disease

## 2020-12-10 DIAGNOSIS — I10 Essential (primary) hypertension: Secondary | ICD-10-CM

## 2020-12-10 NOTE — Telephone Encounter (Signed)
Rx(s) sent to pharmacy electronically.  

## 2020-12-22 ENCOUNTER — Other Ambulatory Visit: Payer: Self-pay

## 2020-12-22 ENCOUNTER — Ambulatory Visit (INDEPENDENT_AMBULATORY_CARE_PROVIDER_SITE_OTHER): Payer: BLUE CROSS/BLUE SHIELD | Admitting: Physician Assistant

## 2020-12-22 ENCOUNTER — Encounter (INDEPENDENT_AMBULATORY_CARE_PROVIDER_SITE_OTHER): Payer: Self-pay | Admitting: Physician Assistant

## 2020-12-22 VITALS — BP 132/77 | HR 70 | Temp 97.6°F | Ht 64.0 in | Wt 185.0 lb

## 2020-12-22 DIAGNOSIS — E669 Obesity, unspecified: Secondary | ICD-10-CM

## 2020-12-22 DIAGNOSIS — Z6832 Body mass index (BMI) 32.0-32.9, adult: Secondary | ICD-10-CM

## 2020-12-22 DIAGNOSIS — I10 Essential (primary) hypertension: Secondary | ICD-10-CM

## 2020-12-22 DIAGNOSIS — E559 Vitamin D deficiency, unspecified: Secondary | ICD-10-CM

## 2020-12-22 NOTE — Progress Notes (Signed)
Chief Complaint:   OBESITY Hannah Short is here to discuss her progress with her obesity treatment plan along with follow-up of her obesity related diagnoses. Hannah Short is on following a lower carbohydrate, vegetable and lean protein rich diet plan and states she is following her eating plan approximately 85-90% of the time. Hannah Short states she is riding the stationary bike and doing stretching for 20 minutes 3 times per week.  Today's visit was #: 7 Starting weight: 195 lbs Starting date: 09/01/2020 Today's weight: 185 lbs Today's date: 12/22/2020 Total lbs lost to date: 10 Total lbs lost since last in-office visit: 1  Interim History: Hannah Short did well with weight loss. She states that she lost 3 lbs and then started eating at a new diner, and she has eaten there 5 days this week. She notes that her mood has been more labile.  Subjective:   1. Vitamin D deficiency Hannah Short is on Vit D OTC. Her last Vit D level was 40.3, and she is due for labs.  2. Essential hypertension Moneisha's blood pressure is higher than normal today, though she is still in normal range. She reports increased stress.  Assessment/Plan:   1. Vitamin D deficiency Low Vitamin D level contributes to fatigue and are associated with obesity, breast, and colon cancer. Hannah Short agreed to continue taking Vitamin D, and we will recheck labs at her next visit. She will follow-up for routine testing of Vitamin D, at least 2-3 times per year to avoid over-replacement.  2. Essential hypertension Hannah Short will continue her medications, and will increase exercise to improve blood pressure control. We will watch for signs of hypotension as she continues her lifestyle modifications.  3. Class 1 obesity with serious comorbidity and body mass index (BMI) of 32.0 to 32.9 in adult, unspecified obesity type Hannah Short is currently in the action stage of change. As such, her goal is to continue with weight loss efforts. She has agreed to  following a lower carbohydrate, vegetable and lean protein rich diet plan.   We will recheck labs at her next visit.  Exercise goals: As is.  Behavioral modification strategies: meal planning and cooking strategies and planning for success.  Hannah Short has agreed to follow-up with our clinic in 4 weeks. She was informed of the importance of frequent follow-up visits to maximize her success with intensive lifestyle modifications for her multiple health conditions.   Objective:   Blood pressure 132/77, pulse 70, temperature 97.6 F (36.4 C), height 5\' 4"  (1.626 m), weight 185 lb (83.9 kg), SpO2 96 %. Body mass index is 31.76 kg/m.  General: Cooperative, alert, well developed, in no acute distress. HEENT: Conjunctivae and lids unremarkable. Cardiovascular: Regular rhythm.  Lungs: Normal work of breathing. Neurologic: No focal deficits.   Lab Results  Component Value Date   CREATININE 0.73 08/12/2020   BUN 12 08/12/2020   NA 141 08/12/2020   K 3.5 08/12/2020   CL 102 08/12/2020   CO2 33 (H) 08/12/2020   Lab Results  Component Value Date   ALT 24 08/12/2020   AST 18 08/12/2020   ALKPHOS 71 08/12/2020   BILITOT 0.5 08/12/2020   Lab Results  Component Value Date   HGBA1C 5.4 09/01/2020   Lab Results  Component Value Date   INSULIN 19.9 09/01/2020   Lab Results  Component Value Date   TSH 3.28 08/12/2020   Lab Results  Component Value Date   CHOL 168 09/01/2020   HDL 55 09/01/2020   Grant 95 09/01/2020  TRIG 101 09/01/2020   Lab Results  Component Value Date   WBC 5.5 08/12/2020   HGB 14.0 08/12/2020   HCT 41.4 08/12/2020   MCV 90.0 08/12/2020   PLT 283.0 08/12/2020   No results found for: IRON, TIBC, FERRITIN  Attestation Statements:   Reviewed by clinician on day of visit: allergies, medications, problem list, medical history, surgical history, family history, social history, and previous encounter notes.  Time spent on visit including pre-visit chart  review and post-visit care and charting was 45 minutes.    Wilhemena Durie, am acting as transcriptionist for [employer], PA-C.  I have reviewed the above documentation for accuracy and completeness, and I agree with the above. Abby Potash, PA-C

## 2021-01-06 DIAGNOSIS — Z6832 Body mass index (BMI) 32.0-32.9, adult: Secondary | ICD-10-CM | POA: Diagnosis not present

## 2021-01-06 DIAGNOSIS — R300 Dysuria: Secondary | ICD-10-CM | POA: Diagnosis not present

## 2021-01-19 ENCOUNTER — Ambulatory Visit (INDEPENDENT_AMBULATORY_CARE_PROVIDER_SITE_OTHER): Payer: BLUE CROSS/BLUE SHIELD | Admitting: Physician Assistant

## 2021-02-05 ENCOUNTER — Ambulatory Visit (INDEPENDENT_AMBULATORY_CARE_PROVIDER_SITE_OTHER): Payer: BLUE CROSS/BLUE SHIELD | Admitting: Physician Assistant

## 2021-02-05 ENCOUNTER — Other Ambulatory Visit: Payer: Self-pay

## 2021-02-05 ENCOUNTER — Encounter (INDEPENDENT_AMBULATORY_CARE_PROVIDER_SITE_OTHER): Payer: Self-pay | Admitting: Physician Assistant

## 2021-02-05 VITALS — BP 102/69 | HR 72 | Temp 97.5°F | Ht 64.0 in | Wt 184.0 lb

## 2021-02-05 DIAGNOSIS — Z6832 Body mass index (BMI) 32.0-32.9, adult: Secondary | ICD-10-CM | POA: Diagnosis not present

## 2021-02-05 DIAGNOSIS — E8881 Metabolic syndrome: Secondary | ICD-10-CM | POA: Diagnosis not present

## 2021-02-05 DIAGNOSIS — E669 Obesity, unspecified: Secondary | ICD-10-CM

## 2021-02-12 NOTE — Progress Notes (Signed)
Chief Complaint:   OBESITY Hannah Short is here to discuss her progress with her obesity treatment plan along with follow-up of her obesity related diagnoses. Hannah Short is on following a lower carbohydrate, vegetable and lean protein rich diet plan and states she is following her eating plan approximately 75-80% of the time. Hannah Short states she is walking 1.5 miles 3-4 times per week, and doing yoga for 60 minutes 1 time per week.  Today's visit was #: 8 Starting weight: 195 lbs Starting date: 09/01/2020 Today's weight: 184 lbs Today's date: 02/05/2021 Total lbs lost to date: 11 Total lbs lost since last in-office visit: 1  Interim History: Hannah Short did well with weight loss. She is overeating her snack calories at times. She is motivated to get back on the plan. She does not weigh her protein.  Subjective:   1. Insulin resistance Hannah Short could not tolerate metformin.  Assessment/Plan:   1. Insulin resistance Hannah Short will continue her meal plan, exercise, and decreasing simple carbohydrates to help decrease the risk of diabetes. Hannah Short agreed to follow-up with [place] as directed to closely monitor her progress.  2. Class 1 obesity with serious comorbidity and body mass index (BMI) of 32.0 to 32.9 in adult, unspecified obesity type, current bmi 31.57 Hannah Short is currently in the action stage of change. As such, her goal is to continue with weight loss efforts. She has agreed to following a lower carbohydrate, vegetable and lean protein rich diet plan.   Kirke Shaggy and Mancel Parsons was discussed today, and Hannah Short will check with her insurance company.  Exercise goals: As is.  Behavioral modification strategies: increasing lean protein intake and no skipping meals.  Hannah Short has agreed to follow-up with our clinic in 4 weeks. She was informed of the importance of frequent follow-up visits to maximize her success with intensive lifestyle modifications for her multiple health conditions.   Objective:    Blood pressure 102/69, pulse 72, temperature (!) 97.5 F (36.4 C), height 5\' 4"  (1.626 m), weight 184 lb (83.5 kg), SpO2 98 %. Body mass index is 31.58 kg/m.  General: Cooperative, alert, well developed, in no acute distress. HEENT: Conjunctivae and lids unremarkable. Cardiovascular: Regular rhythm.  Lungs: Normal work of breathing. Neurologic: No focal deficits.   Lab Results  Component Value Date   CREATININE 0.73 08/12/2020   BUN 12 08/12/2020   NA 141 08/12/2020   K 3.5 08/12/2020   CL 102 08/12/2020   CO2 33 (H) 08/12/2020   Lab Results  Component Value Date   ALT 24 08/12/2020   AST 18 08/12/2020   ALKPHOS 71 08/12/2020   BILITOT 0.5 08/12/2020   Lab Results  Component Value Date   HGBA1C 5.4 09/01/2020   Lab Results  Component Value Date   INSULIN 19.9 09/01/2020   Lab Results  Component Value Date   TSH 3.28 08/12/2020   Lab Results  Component Value Date   CHOL 168 09/01/2020   HDL 55 09/01/2020   LDLCALC 95 09/01/2020   TRIG 101 09/01/2020   Lab Results  Component Value Date   VD25OH 40.3 09/01/2020   Lab Results  Component Value Date   WBC 5.5 08/12/2020   HGB 14.0 08/12/2020   HCT 41.4 08/12/2020   MCV 90.0 08/12/2020   PLT 283.0 08/12/2020   No results found for: IRON, TIBC, FERRITIN  Attestation Statements:   Reviewed by clinician on day of visit: allergies, medications, problem list, medical history, surgical history, family history, social history, and previous encounter  notes.  Time spent on visit including pre-visit chart review and post-visit care and charting was 30 minutes.    Wilhemena Durie, am acting as transcriptionist for [employer], PA-C.  I have reviewed the above documentation for accuracy and completeness, and I agree with the above. Abby Potash, PA-C

## 2021-03-04 DIAGNOSIS — R300 Dysuria: Secondary | ICD-10-CM | POA: Diagnosis not present

## 2021-03-04 DIAGNOSIS — Z6830 Body mass index (BMI) 30.0-30.9, adult: Secondary | ICD-10-CM | POA: Diagnosis not present

## 2021-03-05 ENCOUNTER — Ambulatory Visit (INDEPENDENT_AMBULATORY_CARE_PROVIDER_SITE_OTHER): Payer: BLUE CROSS/BLUE SHIELD | Admitting: Family Medicine

## 2021-03-23 ENCOUNTER — Ambulatory Visit (INDEPENDENT_AMBULATORY_CARE_PROVIDER_SITE_OTHER): Payer: BLUE CROSS/BLUE SHIELD | Admitting: Family Medicine

## 2021-04-01 DIAGNOSIS — Z6830 Body mass index (BMI) 30.0-30.9, adult: Secondary | ICD-10-CM | POA: Diagnosis not present

## 2021-04-01 DIAGNOSIS — R300 Dysuria: Secondary | ICD-10-CM | POA: Diagnosis not present

## 2021-04-15 ENCOUNTER — Ambulatory Visit (INDEPENDENT_AMBULATORY_CARE_PROVIDER_SITE_OTHER): Payer: BLUE CROSS/BLUE SHIELD | Admitting: Family Medicine

## 2021-04-29 ENCOUNTER — Ambulatory Visit (INDEPENDENT_AMBULATORY_CARE_PROVIDER_SITE_OTHER): Payer: BLUE CROSS/BLUE SHIELD | Admitting: Family Medicine

## 2021-05-21 ENCOUNTER — Ambulatory Visit: Payer: BLUE CROSS/BLUE SHIELD | Admitting: Urology

## 2021-05-21 DIAGNOSIS — R0609 Other forms of dyspnea: Secondary | ICD-10-CM

## 2021-05-25 ENCOUNTER — Ambulatory Visit (INDEPENDENT_AMBULATORY_CARE_PROVIDER_SITE_OTHER): Payer: BLUE CROSS/BLUE SHIELD | Admitting: Family Medicine

## 2021-05-26 ENCOUNTER — Other Ambulatory Visit: Payer: Self-pay

## 2021-05-26 ENCOUNTER — Ambulatory Visit (INDEPENDENT_AMBULATORY_CARE_PROVIDER_SITE_OTHER): Payer: BLUE CROSS/BLUE SHIELD | Admitting: Cardiovascular Disease

## 2021-05-26 ENCOUNTER — Encounter: Payer: Self-pay | Admitting: Cardiovascular Disease

## 2021-05-26 VITALS — BP 120/88 | HR 69 | Ht 64.0 in | Wt 184.7 lb

## 2021-05-26 DIAGNOSIS — R0609 Other forms of dyspnea: Secondary | ICD-10-CM

## 2021-05-26 DIAGNOSIS — R0789 Other chest pain: Secondary | ICD-10-CM | POA: Diagnosis not present

## 2021-05-26 DIAGNOSIS — I10 Essential (primary) hypertension: Secondary | ICD-10-CM | POA: Diagnosis not present

## 2021-05-26 NOTE — Assessment & Plan Note (Signed)
History of atypical chest pain with a coronary calcium score of 0.  She is no longer symptomatic.

## 2021-05-26 NOTE — Assessment & Plan Note (Signed)
History of dyspnea on exertion with normal 2D echo performed 12/18/2019.  She is lost 20 pounds as result of diet and exercise and her dyspnea has resolved.

## 2021-05-26 NOTE — Progress Notes (Signed)
05/26/2021 [name]   Sep 16, 1955  [id number]  Primary Physician Lanelle Bal, PA-C Primary Cardiologist: Lorretta Harp MD [name], [place]  HPI:  Hannah Short is a 65 y.o.  moderately overweight married Caucasian female mother of 5 children, grandmother of 28 grandchildren and great grandmother of 3 grandchildren who is referred by Lanelle Bal, PA-C, her PCP, for evaluation of atypical chest pain and dyspnea.  I have taken care of her pastor [name], her mother [name] and her brother [name].  She works as a [name] and takes care of her daughter  [name] who has spina bifida.  I last saw her in the office 11/26/2020.  She has no cardiac risk factors.  She is never had a heart attack or stroke.  She has had a Nissen fundoplication 24 years ago for hiatal hernia.  She also has GERD and is on a PPI which somewhat improves her chest pain.  She is also noticed increasing dyspnea over the last month.   She had ave a normal 2D echo and a coronary calcium score of 0.  I did refer her to weight loss center and she is motivated to continue to lose weight.  She is lost 5 pounds and anticipates losing an additional 20.  She denies chest pain or shortness of breath.   Current Meds  Medication Sig   fexofenadine (ALLEGRA) 180 MG tablet Take 180 mg by mouth daily as needed.   omeprazole (PRILOSEC) 40 MG capsule Take 40 mg by mouth daily.   RESTASIS 0.05 % ophthalmic emulsion 1 drop 2 (two) times daily.   [DISCONTINUED] amLODipine (NORVASC) 5 MG tablet TAKE ONE TABLET BY MOUTH DAILY     Allergies  Allergen Reactions   Topamax [Topiramate] Other (See Comments)    Pt.states makes legs go numb   Betanidine [Bethanidine]    Clonazepam     BALANCE ISSUE   Flagyl [Metronidazole]    Sulfa Antibiotics     Social History   Socioeconomic History   Marital status: Married    Spouse name: [name]   Number of children: 5   Years of education: 12   Highest  education level: Not on file  Occupational History   Occupation: CNA  Tobacco Use   Smoking status: Never   Smokeless tobacco: Never  Substance and Sexual Activity   Alcohol use: No   Drug use: No   Sexual activity: Not on file  Other Topics Concern   Not on file  Social History Narrative   Patient lives at home with her husband [name]) and father in Sports coach and [place] her daughter.   Patient is taking care of her father full time and disabled daughter.   Education some college.    Right handed.   Caffeine one cup of coffee daily.   Pt states she is increasing her water intake.    Drinks decaf tea.       Social Determinants of Health   Financial Resource Strain: Not on file  Food Insecurity: Not on file  Transportation Needs: Not on file  Physical Activity: Not on file  Stress: Not on file  Social Connections: Not on file  Intimate Partner Violence: Not on file     Review of Systems: General: negative for chills, fever, night sweats or weight changes.  Cardiovascular: negative for chest pain, dyspnea on exertion, edema, orthopnea, palpitations, paroxysmal nocturnal dyspnea or shortness of breath Dermatological: negative for rash Respiratory: negative  for cough or wheezing Urologic: negative for hematuria Abdominal: negative for nausea, vomiting, diarrhea, bright red blood per rectum, melena, or hematemesis Neurologic: negative for visual changes, syncope, or dizziness All other systems reviewed and are otherwise negative except as noted above.    Blood pressure 120/88, pulse 69, height 5\' 4"  (1.626 m), weight 184 lb 11.2 oz (83.8 kg), SpO2 96 %.  General appearance: alert and no distress Neck: no adenopathy, no carotid bruit, no JVD, supple, symmetrical, trachea midline, and thyroid not enlarged, symmetric, no tenderness/mass/nodules Lungs: clear to auscultation bilaterally Heart: regular rate and rhythm, S1, S2 normal, no murmur, click, rub or gallop Extremities:  extremities normal, atraumatic, no cyanosis or edema Pulses: 2+ and symmetric Skin: Skin color, texture, turgor normal. No rashes or lesions Neurologic: Grossly normal  EKG sinus rhythm at 70 with evidence of LVH and left axis deviation.  I personally reviewed this EKG.  ASSESSMENT AND PLAN:   Atypical chest pain History of atypical chest pain with a coronary calcium score of 0.  She is no longer symptomatic.  Dyspnea on exertion History of dyspnea on exertion with normal 2D echo performed 12/18/2019.  She is lost 20 pounds as result of diet and exercise and her dyspnea has resolved.  Essential hypertension History of essential hypertension on amlodipine 2.5 mg a day.  I have told her that she can discontinue the amlodipine, monitor blood pressure in the morning and if her blood pressure remains in normal range she can continue to come off her medication.  Morbid obesity ([place]) History of obesity having lost 20 pounds by her account as result of diet and exercise.     Lorretta Harp MD FACP,FACC,FAHA, [facility] 05/26/2021 12:17 PM

## 2021-05-26 NOTE — Assessment & Plan Note (Signed)
History of obesity having lost 20 pounds by her account as result of diet and exercise.

## 2021-05-26 NOTE — Patient Instructions (Signed)
Medication Instructions:   -Stop amlodipine (norvasc). Monitor blood pressure on a daily basis.  *If you need a refill on your cardiac medications before your next appointment, please call your pharmacy*   Follow-Up: At [facility], you and your health needs are our priority.  As part of our continuing mission to provide you with exceptional heart care, we have created designated Provider Care Teams.  These Care Teams include your primary Cardiologist (physician) and Advanced Practice Providers (APPs -  Physician Assistants and Nurse Practitioners) who all work together to provide you with the care you need, when you need it.  We recommend signing up for the patient portal called "MyChart".  Sign up information is provided on this After Visit Summary.  MyChart is used to connect with patients for Virtual Visits (Telemedicine).  Patients are able to view lab/test results, encounter notes, upcoming appointments, etc.  Non-urgent messages can be sent to your provider as well.   To learn more about what you can do with MyChart, go to [email or web address].    Your next appointment:   12 month(s)  The format for your next appointment:   In Person  Provider:   Quay Burow, MD

## 2021-05-26 NOTE — Assessment & Plan Note (Signed)
History of essential hypertension on amlodipine 2.5 mg a day.  I have told her that she can discontinue the amlodipine, monitor blood pressure in the morning and if her blood pressure remains in normal range she can continue to come off her medication.

## 2021-06-01 ENCOUNTER — Ambulatory Visit (INDEPENDENT_AMBULATORY_CARE_PROVIDER_SITE_OTHER): Payer: BLUE CROSS/BLUE SHIELD | Admitting: Family Medicine

## 2021-06-02 ENCOUNTER — Ambulatory Visit (INDEPENDENT_AMBULATORY_CARE_PROVIDER_SITE_OTHER): Payer: BLUE CROSS/BLUE SHIELD | Admitting: Family Medicine

## 2021-06-09 ENCOUNTER — Telehealth: Payer: Self-pay | Admitting: Cardiovascular Disease

## 2021-06-09 NOTE — Telephone Encounter (Signed)
Pt state she saw Dr. Gwenlyn Found on 10/25 and was told she could stop amlodipine if systolic remained less than 150. Pt state she monitored BP for two weeks and only one day during that timeframe her BP was less than 150. She report she has since started back taking amlodipine 2.5 mg on Sunday and requesting a new RX.  Will forward to MD for approval.

## 2021-06-09 NOTE — Telephone Encounter (Signed)
Pt c/o medication issue:  1. Name of Medication: amlodipine 5 mg  2. How are you currently taking this medication (dosage and times per day)? Has been taking half a tablet daily, but stopped it and monitored BP  3. Are you having a reaction (difficulty breathing--STAT)? no  4. What is your medication issue? Patient states she has been taking half a tablet of the 5 mg and was told to stop it and monitor her BP for 2 weeks. She says she did and due to her BP getting high was told to continue the medication. She is requesting a 2.5 mg tablet prescriptions because the pharmacy is not cutting them well.

## 2021-06-10 MED ORDER — AMLODIPINE BESYLATE 2.5 MG PO TABS: 2.5000 mg | ORAL_TABLET | Freq: Every day | ORAL | 3 refills | Status: DC

## 2021-06-10 NOTE — Telephone Encounter (Signed)
Called pt to inform her of new prescription sent to pharmacy for amlodipine 2.5mg . Advised pt to continue to keep a blood pressure log. Pt verbalizes understanding.

## 2021-06-17 ENCOUNTER — Ambulatory Visit (INDEPENDENT_AMBULATORY_CARE_PROVIDER_SITE_OTHER): Payer: 59 | Admitting: Bariatrics

## 2021-06-17 ENCOUNTER — Encounter (INDEPENDENT_AMBULATORY_CARE_PROVIDER_SITE_OTHER): Payer: Self-pay | Admitting: Bariatrics

## 2021-06-17 ENCOUNTER — Other Ambulatory Visit: Payer: Self-pay

## 2021-06-17 VITALS — BP 120/78 | HR 70 | Temp 97.8°F | Ht 64.0 in | Wt 180.0 lb

## 2021-06-17 DIAGNOSIS — E88819 Insulin resistance, unspecified: Secondary | ICD-10-CM

## 2021-06-17 DIAGNOSIS — I10 Essential (primary) hypertension: Secondary | ICD-10-CM | POA: Diagnosis not present

## 2021-06-17 DIAGNOSIS — K760 Fatty (change of) liver, not elsewhere classified: Secondary | ICD-10-CM | POA: Diagnosis not present

## 2021-06-17 DIAGNOSIS — E559 Vitamin D deficiency, unspecified: Secondary | ICD-10-CM

## 2021-06-17 DIAGNOSIS — K581 Irritable bowel syndrome with constipation: Secondary | ICD-10-CM

## 2021-06-17 DIAGNOSIS — E669 Obesity, unspecified: Secondary | ICD-10-CM

## 2021-06-17 DIAGNOSIS — E8881 Metabolic syndrome: Secondary | ICD-10-CM

## 2021-06-17 DIAGNOSIS — Z6833 Body mass index (BMI) 33.0-33.9, adult: Secondary | ICD-10-CM

## 2021-06-17 NOTE — Progress Notes (Signed)
Chief Complaint:   OBESITY Hannah Short is here to discuss her progress with her obesity treatment plan along with follow-up of her obesity related diagnoses. Hannah Short is on the Category 3 Plan and practicing portion control and making smarter food choices, such as increasing vegetables and decreasing simple carbohydrates and states she is following her eating plan approximately 30% of the time. Hannah Short states she is doing 0 minutes 0 times per week.  Today's visit was #: 9 Starting weight: 195 lbs Starting date: 09/01/2020 Today's weight: 180 lbs Today's date: 06/17/2021 Total lbs lost to date: 15 lbs Total lbs lost since last in-office visit: 4 lbs  Interim History: Hannah Short is down another 4 lbs since her last visit.  Subjective:   1. Essential hypertension Kerby's blood pressure is controlled.  2. NAFLD (nonalcoholic fatty liver disease) Hannah Short denies abdominal pain.   3. Irritable bowel syndrome with constipation Hannah Short is currently taking Bentyl, Simethicone and probiotic.   4. Insulin resistance Hannah Short has a diagnosis of insulin resistance based on her elevated fasting insulin level >5. She continues to work on diet and exercise to decrease her risk of diabetes.  5. Vitamin D deficiency She is currently taking prescription Vitamin D daily. She denies nausea, vomiting or muscle weakness.  Assessment/Plan:   1. Essential hypertension Hannah Short will continue her medications. She will have zero salt added. We will check CMP today. She is working on healthy weight loss and exercise to improve blood pressure control. We will watch for signs of hypotension as she continues her lifestyle modifications.  - Comprehensive metabolic panel  2. NAFLD (nonalcoholic fatty liver disease) We discussed the likely diagnosis of non-alcoholic fatty liver disease today and how this condition is obesity related. Hannah Short was educated the importance of weight loss. Hannah Short agreed to continue  with her weight loss efforts with healthier diet and exercise as an essential part of her treatment plan. We will check Insulin and CMP today.  - Insulin, random - Comprehensive metabolic panel  3. Irritable bowel syndrome with constipation Hannah Short was informed that a decrease in bowel movement frequency is normal while losing weight, but stools should not be hard or painful. Orders and follow up as documented in patient record.   Counseling Getting to Good Bowel Health: Your goal is to have one soft bowel movement each day. Drink at least 8 glasses of water each day. Eat plenty of fiber (goal is over 25 grams each day). It is best to get most of your fiber from dietary sources which includes leafy green vegetables, fresh fruit, and whole grains. You may need to add fiber with the help of OTC fiber supplements. These include Metamucil, Citrucel, and Flaxseed. If you are still having trouble, try adding Miralax or Magnesium Citrate. If all of these changes do not work, Cabin crew.   4. Insulin resistance Hannah Short will continue to work on weight loss, exercise, and decreasing simple carbohydrates to help decrease the risk of diabetes. Hannah Short agreed to follow-up with [place] as directed to closely monitor her progress. We will check A1C and Insulin today.  - Insulin, random - Hemoglobin A1c - Comprehensive metabolic panel  5. Vitamin D deficiency Low Vitamin D level contributes to fatigue and are associated with obesity, breast, and colon cancer. Hannah Short agrees to continue to take prescription Vitamin D daily and she will follow-up for routine testing of Vitamin D, at least 2-3 times per year to avoid over-replacement. We will check Vitamin D today.  -  VITAMIN D 25 Hydroxy (Vit-D Deficiency, Fractures)  6. Obesity with current BMI of 31 Hannah Short is currently in the action stage of change. As such, her goal is to continue with weight loss efforts. She has agreed to the Category 3 Plan and  practicing portion control and making smarter food choices, such as increasing vegetables and decreasing simple carbohydrates.   Hannah Short will continue meal planning and she will continue intentional eating. Strategies for the holidays were provided.  Exercise goals: No exercise has been prescribed at this time.  Behavioral modification strategies: increasing lean protein intake, decreasing simple carbohydrates, increasing vegetables, increasing water intake, decreasing eating out, no skipping meals, meal planning and cooking strategies, keeping healthy foods in the home, and planning for success.  Hannah Short has agreed to follow-up with our clinic in 3 weeks with Dr Jearld Shines or Abby Potash, PA-C 6 weeks with myself. She was informed of the importance of frequent follow-up visits to maximize her success with intensive lifestyle modifications for her multiple health conditions.   Objective:   Blood pressure 120/78, pulse 70, temperature 97.8 F (36.6 C), height 5\' 4"  (1.626 m), weight 180 lb (81.6 kg), SpO2 97 %. Body mass index is 30.9 kg/m.  General: Cooperative, alert, well developed, in no acute distress. HEENT: Conjunctivae and lids unremarkable. Cardiovascular: Regular rhythm.  Lungs: Normal work of breathing. Neurologic: No focal deficits.   Lab Results  Component Value Date   CREATININE 0.73 08/12/2020   BUN 12 08/12/2020   NA 141 08/12/2020   K 3.5 08/12/2020   CL 102 08/12/2020   CO2 33 (H) 08/12/2020   Lab Results  Component Value Date   ALT 24 08/12/2020   AST 18 08/12/2020   ALKPHOS 71 08/12/2020   BILITOT 0.5 08/12/2020   Lab Results  Component Value Date   HGBA1C 5.4 09/01/2020   Lab Results  Component Value Date   INSULIN 19.9 09/01/2020   Lab Results  Component Value Date   TSH 3.28 08/12/2020   Lab Results  Component Value Date   CHOL 168 09/01/2020   HDL 55 09/01/2020   LDLCALC 95 09/01/2020   TRIG 101 09/01/2020   Lab Results  Component Value  Date   VD25OH 40.3 09/01/2020   Lab Results  Component Value Date   WBC 5.5 08/12/2020   HGB 14.0 08/12/2020   HCT 41.4 08/12/2020   MCV 90.0 08/12/2020   PLT 283.0 08/12/2020   No results found for: IRON, TIBC, FERRITIN  Attestation Statements:   Reviewed by clinician on day of visit: allergies, medications, problem list, medical history, surgical history, family history, social history, and previous encounter notes.  I, Lizbeth Bark, RMA, am acting as Location manager for [employer], DO.   I have reviewed the above documentation for accuracy and completeness, and I agree with the above. Jearld Lesch, DO

## 2021-06-18 LAB — HEMOGLOBIN A1C
Est. average glucose Bld gHb Est-mCnc: 111 mg/dL
Hgb A1c MFr Bld: 5.5 % (ref 4.8–5.6)

## 2021-06-18 LAB — COMPREHENSIVE METABOLIC PANEL
ALT: 14 IU/L (ref 0–32)
AST: 14 IU/L (ref 0–40)
Albumin/Globulin Ratio: 1.7 (ref 1.2–2.2)
Albumin: 4.6 g/dL (ref 3.8–4.8)
Alkaline Phosphatase: 91 IU/L (ref 44–121)
BUN/Creatinine Ratio: 18 (ref 12–28)
BUN: 13 mg/dL (ref 8–27)
Bilirubin Total: 0.6 mg/dL (ref 0.0–1.2)
CO2: 25 mmol/L (ref 20–29)
Calcium: 9.5 mg/dL (ref 8.7–10.3)
Chloride: 101 mmol/L (ref 96–106)
Creatinine, Ser: 0.74 mg/dL (ref 0.57–1.00)
Globulin, Total: 2.7 g/dL (ref 1.5–4.5)
Glucose: 99 mg/dL (ref 70–99)
Potassium: 3.5 mmol/L (ref 3.5–5.2)
Sodium: 143 mmol/L (ref 134–144)
Total Protein: 7.3 g/dL (ref 6.0–8.5)
eGFR: 90 mL/min/{1.73_m2} (ref >59)

## 2021-06-18 LAB — VITAMIN D 25 HYDROXY (VIT D DEFICIENCY, FRACTURES): Vit D, 25-Hydroxy: 28.6 ng/mL — ABNORMAL LOW (ref 30.0–100.0)

## 2021-06-18 LAB — INSULIN, RANDOM: INSULIN: 10.7 u[IU]/mL (ref 2.6–24.9)

## 2021-07-09 ENCOUNTER — Ambulatory Visit (INDEPENDENT_AMBULATORY_CARE_PROVIDER_SITE_OTHER): Payer: 59 | Admitting: Family Medicine

## 2021-07-20 ENCOUNTER — Encounter: Payer: Self-pay | Admitting: Orthopedic Surgery

## 2021-07-20 ENCOUNTER — Other Ambulatory Visit: Payer: Self-pay

## 2021-07-20 ENCOUNTER — Ambulatory Visit (INDEPENDENT_AMBULATORY_CARE_PROVIDER_SITE_OTHER): Payer: Self-pay | Admitting: Orthopedic Surgery

## 2021-07-20 ENCOUNTER — Ambulatory Visit (INDEPENDENT_AMBULATORY_CARE_PROVIDER_SITE_OTHER): Payer: Self-pay

## 2021-07-20 DIAGNOSIS — M542 Cervicalgia: Secondary | ICD-10-CM

## 2021-07-20 MED ORDER — PREDNISONE 10 MG PO TABS: 10.0000 mg | ORAL_TABLET | Freq: Every day | ORAL | 0 refills | Status: DC

## 2021-07-20 NOTE — Progress Notes (Signed)
Office Visit Note   Patient: Hannah Short           Date of Birth: 1956-06-16           MRN: [id number] Visit Date: 07/20/2021              Requested by: Lanelle Bal, PA-C [place],  [place] [id number] PCP: Lanelle Bal, PA-C  Chief Complaint  Patient presents with   Neck - Pain      HPI: Patient is a 65 year old woman who presents complaining of global shoulder pain radiating down her arm to the little finger index finger and thumb.  She states she has pain on the entire right side of her neck with decreased grip strength.  She has been to massage therapy 3 times a week which helps a little.  Assessment & Plan: Visit Diagnoses:  1. Neck pain     Plan: A prescription was called in for prednisone 10 mg with breakfast daily.  Recommended continue with massage therapy use heat as needed to relieve muscle spasm.  Recommended VMO strengthening and exercise.  Follow-Up Instructions: Return in about 4 weeks (around 08/17/2021).   Ortho Exam  Patient is alert, oriented, no adenopathy, well-dressed, normal affect, normal respiratory effort. Examination patient has full range of motion of both shoulders impingement test are negative.  She is tender to palpation of the paraspinous muscles of the cervical spine there is no focal motor weakness in either upper extremity.  Patient also complains of some VMO weakness in both knees.  Imaging: XR Cervical Spine 2 or 3 views  Result Date: 07/20/2021 2 view radiographs of the cervical spine shows degenerative disc disease with joint space narrowing.  No images are attached to the encounter.  Labs: Lab Results  Component Value Date   HGBA1C 5.5 06/17/2021   HGBA1C 5.4 09/01/2020     Lab Results  Component Value Date   ALBUMIN 4.6 06/17/2021   ALBUMIN 4.5 08/12/2020    No results found for: MG Lab Results  Component Value Date   VD25OH 28.6 (L) 06/17/2021   VD25OH 40.3 09/01/2020    No results found for:  PREALBUMIN CBC EXTENDED Latest Ref Rng & Units 08/12/2020  WBC 4.0 - 10.5 K/uL 5.5  RBC 3.87 - 5.11 Mil/uL 4.60  HGB 12.0 - 15.0 g/dL 14.0  HCT 36.0 - 46.0 % 41.4  PLT 150.0 - 400.0 K/uL 283.0  NEUTROABS 1.4 - 7.7 K/uL 2.2  LYMPHSABS 0.7 - 4.0 K/uL 2.4     There is no height or weight on file to calculate BMI.  Orders:  Orders Placed This Encounter  Procedures   XR Cervical Spine 2 or 3 views   Meds ordered this encounter  Medications   predniSONE (DELTASONE) 10 MG tablet    Sig: Take 1 tablet (10 mg total) by mouth daily with breakfast.    Dispense:  30 tablet    Refill:  0     Procedures: No procedures performed  Clinical Data: No additional findings.  ROS:  All other systems negative, except as noted in the HPI. Review of Systems  Objective: Vital Signs: There were no vitals taken for this visit.  Specialty Comments:  No specialty comments available.  PMFS History: Patient Active Problem List   Diagnosis Date Noted   Essential hypertension 03/04/2020   Morbid obesity ([place]) 03/04/2020   Dyspnea on exertion 11/28/2019   Abdominal pain 11/19/2016   Change in bowel habits 11/19/2016  Atypical chest pain 11/19/2016   Bloating 11/19/2016   Migraine    Fibromyalgia    Past Medical History:  Diagnosis Date   Acute torn meniscus of knee, left, initial encounter 2019   Anemia    Anxiety    Arthritis    Arthritis    Back pain    Bulging lumbar disc    Chest pain    Chronically dry eyes, bilateral    Constipation    Depression    Diverticulosis    Fatty liver    Fibromyalgia    Fibromyalgia    Gallstones    GERD (gastroesophageal reflux disease)    Heart murmur    mitral valve prolapse   Hiatal hernia    Hypertension    Internal hemorrhoids    Joint pain    Lactose intolerance    Lipoma of colon    Migraine    Mitral valve prolapse    Neck pain    L3 4 and 5 bulging discs   Shortness of breath    Tubular adenoma of colon     Family  History  Problem Relation Age of Onset   Heart failure Mother    Heart failure Father    Colon cancer Father    Hypertension Father    Cancer Father     Past Surgical History:  Procedure Laterality Date   CESAREAN SECTION     HERNIA REPAIR     1997   TOE SURGERY  1990   stepped on toothpick   Social History   Occupational History   Occupation: CNA  Tobacco Use   Smoking status: Never   Smokeless tobacco: Never  Substance and Sexual Activity   Alcohol use: No   Drug use: No   Sexual activity: Not on file

## 2021-08-05 ENCOUNTER — Ambulatory Visit (INDEPENDENT_AMBULATORY_CARE_PROVIDER_SITE_OTHER): Payer: 59 | Admitting: Bariatrics

## 2021-08-10 ENCOUNTER — Ambulatory Visit: Payer: Self-pay | Admitting: Orthopedic Surgery

## 2021-08-31 ENCOUNTER — Telehealth: Payer: Self-pay | Admitting: Orthopedic Surgery

## 2021-08-31 ENCOUNTER — Other Ambulatory Visit: Payer: Self-pay

## 2021-08-31 MED ORDER — PREDNISONE 10 MG PO TABS: 10.0000 mg | ORAL_TABLET | Freq: Every day | ORAL | 0 refills | Status: DC

## 2021-08-31 NOTE — Telephone Encounter (Signed)
Pt was in the office 07/20/2021 and rx for prednisone was given at that time. Pt requesting refill and has follow up appt next week. Please advise.

## 2021-08-31 NOTE — Telephone Encounter (Signed)
Pt called stating she messaged Dr.Duda about a refill for her prednisone rx and he told her Autumn would be able to call that in. I made pt aware autumn put in a request for Erin to call in. Pt would still like a CB to update her if Autumn can call in rx rather than wait for Erin.

## 2021-08-31 NOTE — Telephone Encounter (Signed)
Rx has been sent to pharm. Disregard previous message.

## 2021-08-31 NOTE — Telephone Encounter (Signed)
Pt called stating she has an appt on 09/10/21 but she's experiencing pain anytime she moves. Pt would like to know if she can get a refill of her prednisone 10 mg rx please.   [phone]

## 2021-09-10 ENCOUNTER — Other Ambulatory Visit: Payer: Self-pay

## 2021-09-10 ENCOUNTER — Ambulatory Visit (INDEPENDENT_AMBULATORY_CARE_PROVIDER_SITE_OTHER): Payer: 59 | Admitting: Orthopedic Surgery

## 2021-09-10 ENCOUNTER — Encounter: Payer: Self-pay | Admitting: Orthopedic Surgery

## 2021-09-10 DIAGNOSIS — M5412 Radiculopathy, cervical region: Secondary | ICD-10-CM

## 2021-09-10 NOTE — Progress Notes (Signed)
Office Visit Note   Patient: Hannah Short           Date of Birth: 06/23/1956           MRN: [id number] Visit Date: 09/10/2021              Requested by: Lanelle Bal, PA-C [place],  [place] [id number] PCP: Lanelle Bal, PA-C  Chief Complaint  Patient presents with   Neck - Pain      HPI: Patient is a 66 year old woman who presents with persistent cervical radicular pain from her cervical spine posterior aspect of her shoulder radiating down to her right hand.  Patient has undergone treatment with prednisone 10 mg at morning as well as massage therapy.  Patient states that she now is on a prednisone Dosepak from her primary care physician.  Assessment & Plan: Visit Diagnoses:  1. Radiculopathy, cervical region     Plan: Physical therapy has been ordered patient will work with therapy and an MRI scan is ordered today to see if epidural steroid would be beneficial.  Also recommend that she can alternate nonsteroidals and Tylenol as well as heat for her cervical spine.  Follow-Up Instructions: Return if symptoms worsen or fail to improve.   Ortho Exam  Patient is alert, oriented, no adenopathy, well-dressed, normal affect, normal respiratory effort. Examination patient has no pain with passive range of motion of the shoulder she has pain posteriorly that radiates down her arm.  Patient complains of weakness states her hand is shaking and cannot even pick up a coffee cup secondary to weakness.  Imaging: No results found. No images are attached to the encounter.  Labs: Lab Results  Component Value Date   HGBA1C 5.5 06/17/2021   HGBA1C 5.4 09/01/2020     Lab Results  Component Value Date   ALBUMIN 4.6 06/17/2021   ALBUMIN 4.5 08/12/2020    No results found for: MG Lab Results  Component Value Date   VD25OH 28.6 (L) 06/17/2021   VD25OH 40.3 09/01/2020    No results found for: PREALBUMIN CBC EXTENDED Latest Ref Rng & Units 08/12/2020  WBC 4.0 - 10.5  K/uL 5.5  RBC 3.87 - 5.11 Mil/uL 4.60  HGB 12.0 - 15.0 g/dL 14.0  HCT 36.0 - 46.0 % 41.4  PLT 150.0 - 400.0 K/uL 283.0  NEUTROABS 1.4 - 7.7 K/uL 2.2  LYMPHSABS 0.7 - 4.0 K/uL 2.4     There is no height or weight on file to calculate BMI.  Orders:  Orders Placed This Encounter  Procedures   MR Cervical Spine w/o contrast   No orders of the defined types were placed in this encounter.    Procedures: No procedures performed  Clinical Data: No additional findings.  ROS:  All other systems negative, except as noted in the HPI. Review of Systems  Objective: Vital Signs: There were no vitals taken for this visit.  Specialty Comments:  No specialty comments available.  PMFS History: Patient Active Problem List   Diagnosis Date Noted   Essential hypertension 03/04/2020   Morbid obesity ([place]) 03/04/2020   Dyspnea on exertion 11/28/2019   Abdominal pain 11/19/2016   Change in bowel habits 11/19/2016   Atypical chest pain 11/19/2016   Bloating 11/19/2016   Migraine    Fibromyalgia    Past Medical History:  Diagnosis Date   Acute torn meniscus of knee, left, initial encounter 2019   Anemia    Anxiety    Arthritis  Arthritis    Back pain    Bulging lumbar disc    Chest pain    Chronically dry eyes, bilateral    Constipation    Depression    Diverticulosis    Fatty liver    Fibromyalgia    Fibromyalgia    Gallstones    GERD (gastroesophageal reflux disease)    Heart murmur    mitral valve prolapse   Hiatal hernia    Hypertension    Internal hemorrhoids    Joint pain    Lactose intolerance    Lipoma of colon    Migraine    Mitral valve prolapse    Neck pain    L3 4 and 5 bulging discs   Shortness of breath    Tubular adenoma of colon     Family History  Problem Relation Age of Onset   Heart failure Mother    Heart failure Father    Colon cancer Father    Hypertension Father    Cancer Father     Past Surgical History:  Procedure Laterality  Date   CESAREAN SECTION     HERNIA REPAIR     1997   TOE SURGERY  1990   stepped on toothpick   Social History   Occupational History   Occupation: CNA  Tobacco Use   Smoking status: Never   Smokeless tobacco: Never  Substance and Sexual Activity   Alcohol use: No   Drug use: No   Sexual activity: Not on file

## 2021-09-23 ENCOUNTER — Ambulatory Visit
Admission: RE | Admit: 2021-09-23 | Discharge: 2021-09-23 | Disposition: A | Discharge status: Home / Self Care | Payer: 59 | Source: Ambulatory Visit | Attending: Orthopedic Surgery | Admitting: Orthopedic Surgery

## 2021-09-23 ENCOUNTER — Other Ambulatory Visit: Payer: Self-pay

## 2021-09-23 DIAGNOSIS — M5412 Radiculopathy, cervical region: Secondary | ICD-10-CM

## 2021-09-23 IMAGING — MR MR CERVICAL SPINE W/O CM
4 of 5 series · 30 of 48 positions shown · non-contrast
Comparison: Cervical spine radiographs [DATE].

CLINICAL DATA: 65-year-old female with neck, shoulder and arm pain
for 3 months. No known injury.

EXAM:
MRI CERVICAL SPINE WITHOUT CONTRAST
TECHNIQUE: Multiplanar, multisequence MR imaging of the cervical spine was
performed. No intravenous contrast was administered.

[Series 2: T2 · sagittal · 3.0mm · 0.66mm/px · 8 of 15 slices shown (1 of 2)]
[im 1/15]
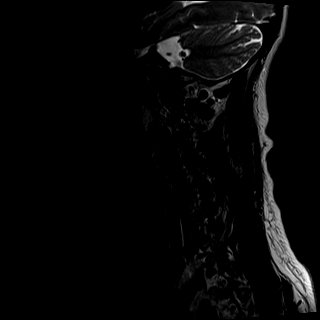
[im 3/15]
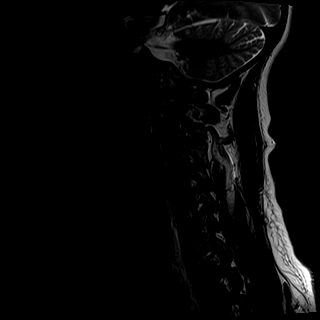
[im 5/15]
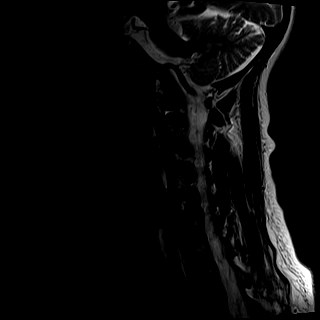
[im 7/15]
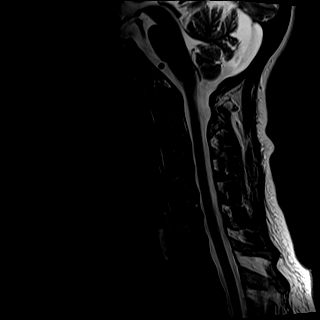
[im 9/15]
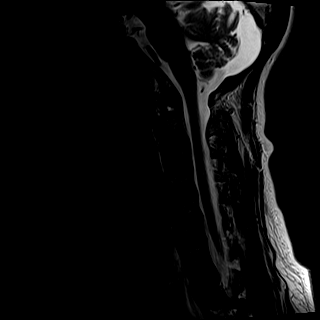
[im 11/15]
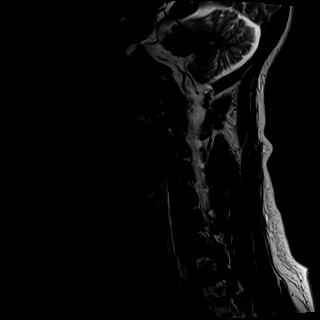
[im 13/15]
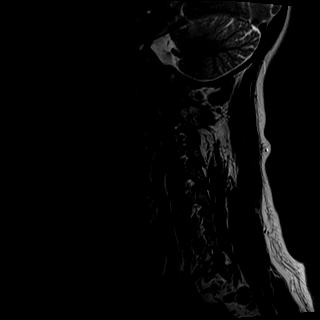
[im 15/15]
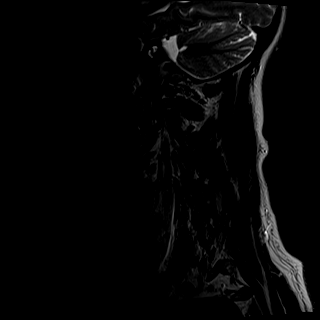

[Series 3: T1 · sagittal · 3.0mm · 0.41mm/px · 7 of 15 slices shown]
[im 1/15]
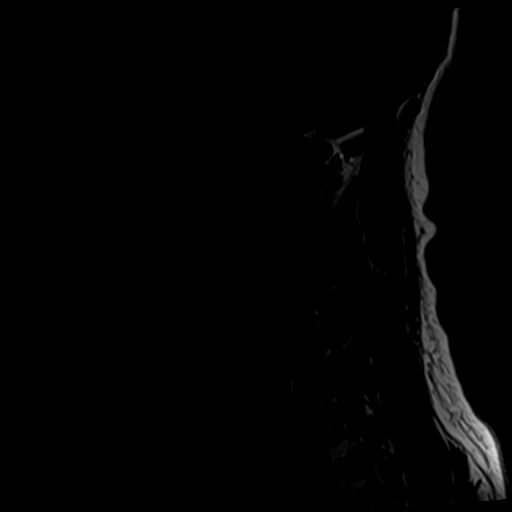
[im 3/15]
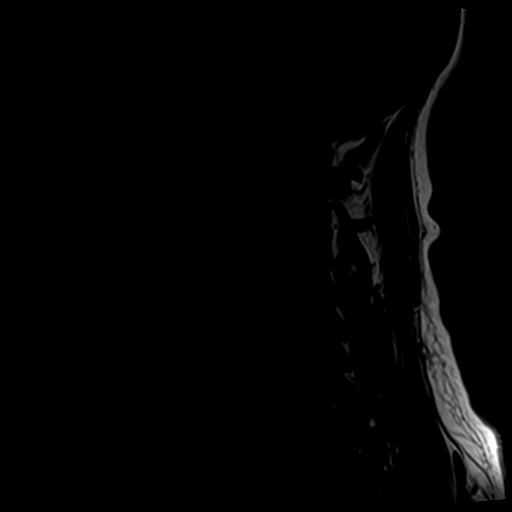
[im 5/15]
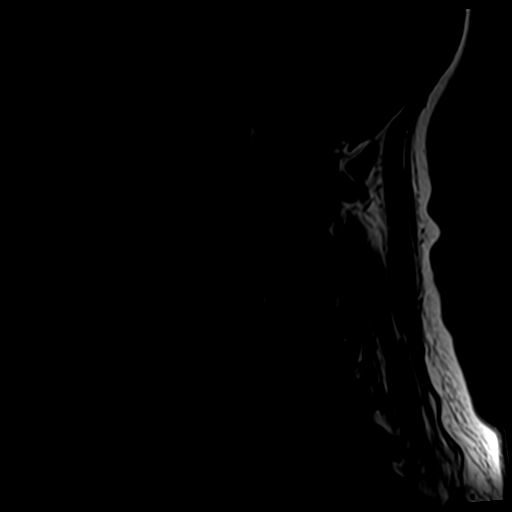
[im 8/15]
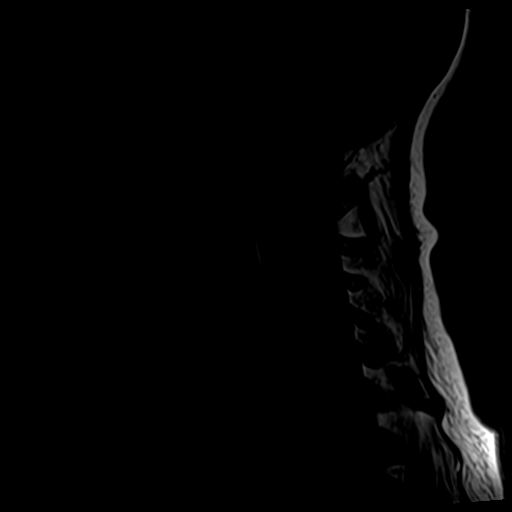
[im 10/15]
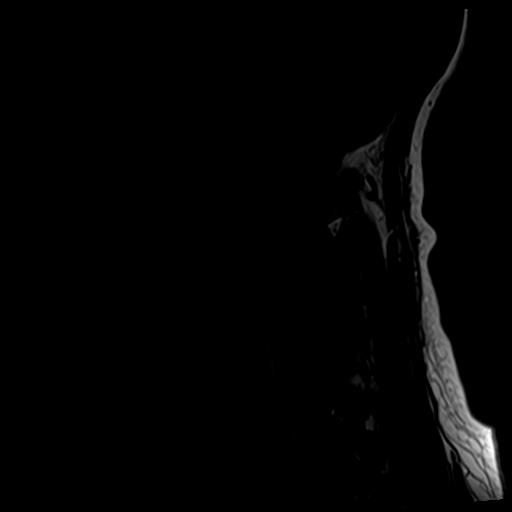
[im 12/15]
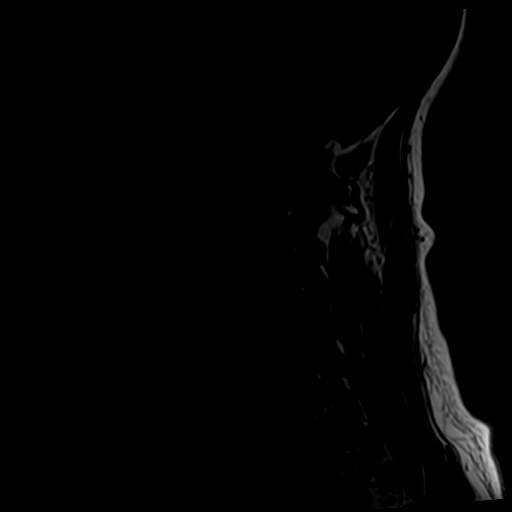
[im 15/15]
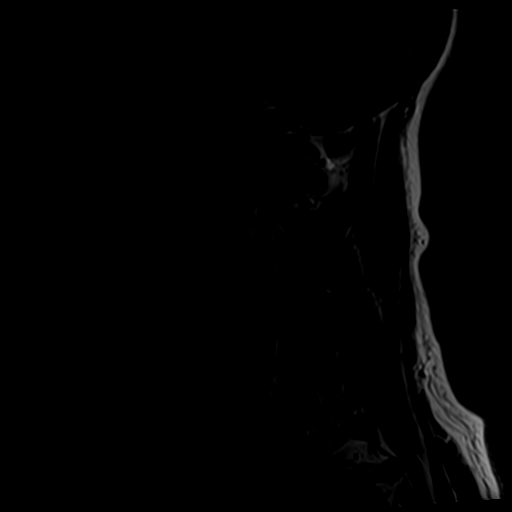

[Series 4: tir sag · sagittal · 3.0mm · 0.41mm/px · 6 of 15 slices shown]
[im 1/15]
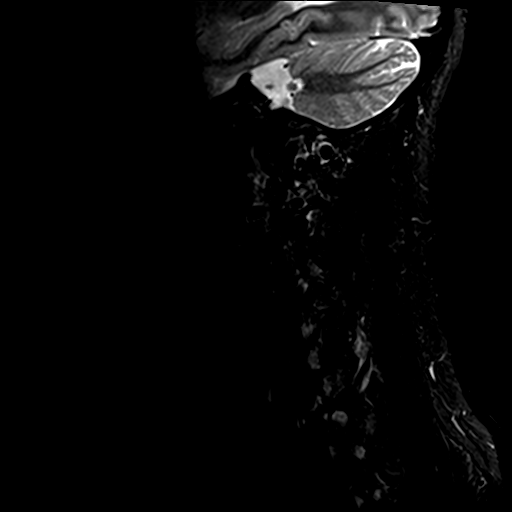
[im 3/15]
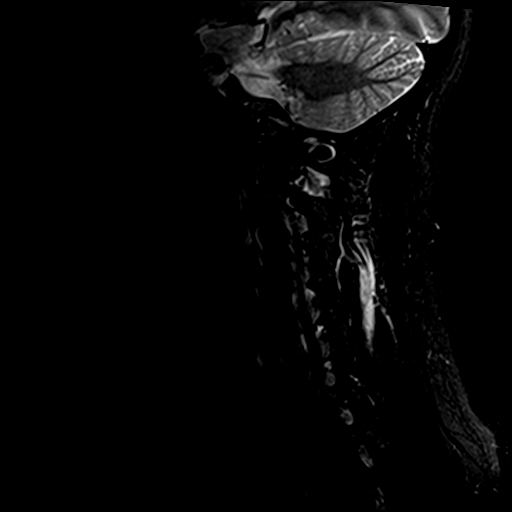
[im 5/15]
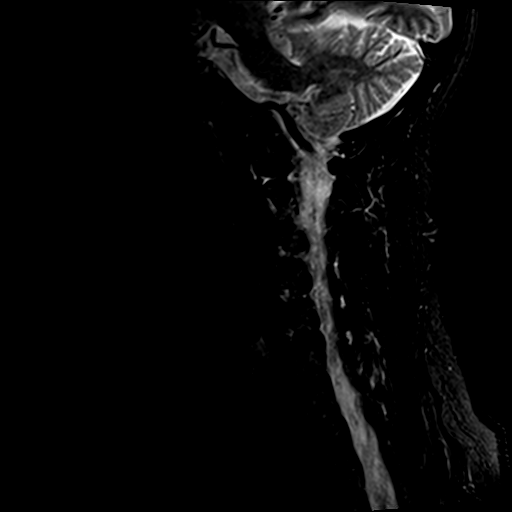
[im 8/15]
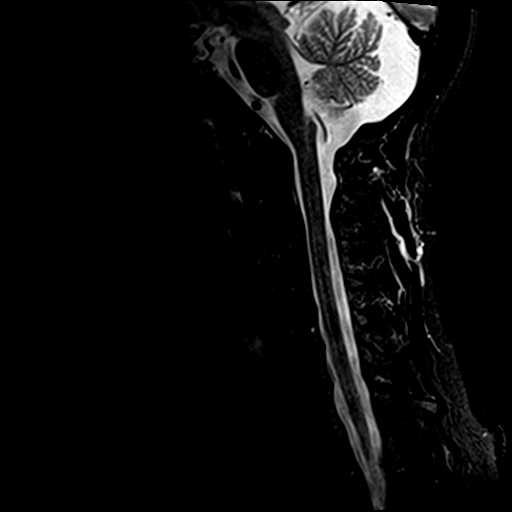
[im 10/15]
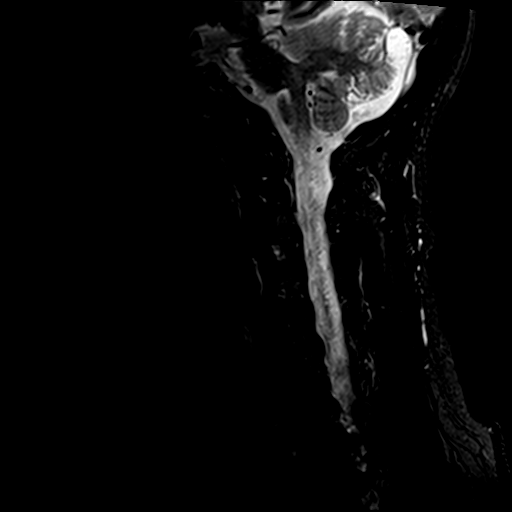
[im 12/15]
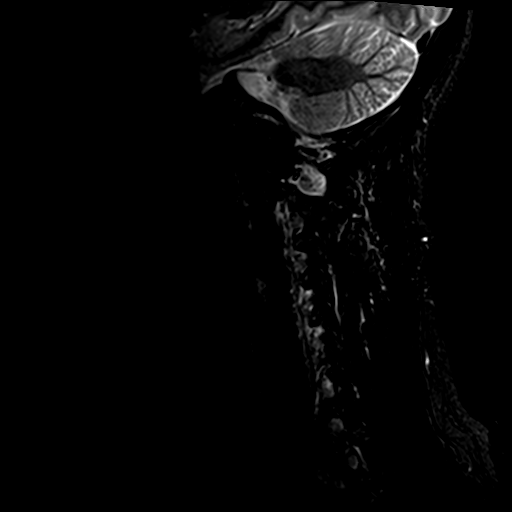

[Series 6: T2 · axial · 3.0mm · 0.70mm/px · z∈[-67,+27]mm · 9 of 27 slices shown (2 of 2)]
[im 1/27]
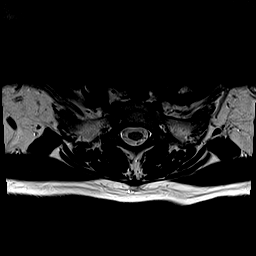
[im 5/27]
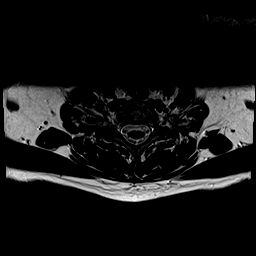
[im 9/27]
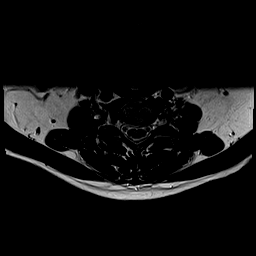
[im 11/27]
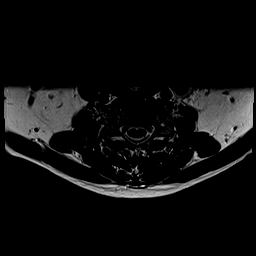
[im 14/27]
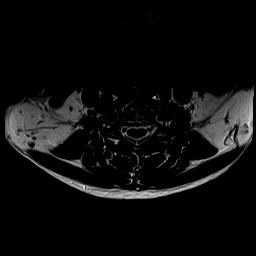
[im 16/27]
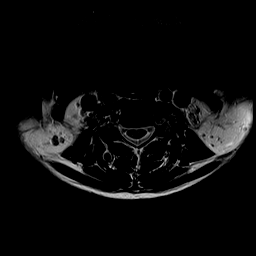
[im 18/27]
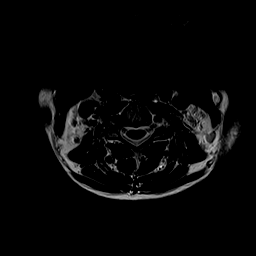
[im 22/27]
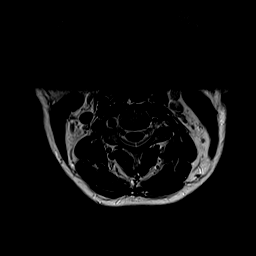
[im 27/27]
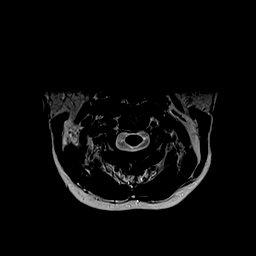

[30 of 48 positions shown; findings below may reference images not displayed]

FINDINGS: Alignment: Straightening of cervical lordosis compared to the
[facility] radiographs. No spondylolisthesis.

Vertebrae: No marrow edema or evidence of acute osseous abnormality.
Visualized bone marrow signal is within normal limits.

Cord: Normal. Capacious cervical and visible upper thoracic spinal
canal.

Posterior Fossa, vertebral arteries, paraspinal tissues:
Cervicomedullary junction is within normal limits. Negative visible
posterior fossa. Preserved major vascular flow voids in the neck.
Partially retropharyngeal course of the ICAs at the C2-C3 level.
Dominant right vertebral artery. Negative visible neck soft tissues
and left lung apex.

Disc levels:

C2-C3:  Negative.

C3-C4:  Negative.

C4-C5:  Negative.

C5-C6: Subtle disc space loss and circumferential disc bulging. Mild
endplate spurring most pronounced at the right foramen. Mild facet
hypertrophy on the right. No spinal stenosis. Mild to moderate right
C6 foraminal stenosis.

C6-C7:  Subtle circumferential disc bulging. No stenosis.

C7-T1:  Mild facet hypertrophy greater on the right. No stenosis.
IMPRESSION: 1. Mild for age cervical spine degeneration, primarily limited to
C5-C6 where mild disc bulging and endplate spurring combine for mild
to moderate right C6 neural foraminal stenosis.
2. Capacious cervical spinal canal and no spinal stenosis.

## 2021-09-28 ENCOUNTER — Encounter (HOSPITAL_COMMUNITY): Payer: Self-pay

## 2021-09-28 ENCOUNTER — Other Ambulatory Visit: Payer: Self-pay | Admitting: Orthopedic Surgery

## 2021-09-28 ENCOUNTER — Ambulatory Visit (HOSPITAL_COMMUNITY): Payer: 59 | Attending: Family Medicine

## 2021-09-28 ENCOUNTER — Other Ambulatory Visit: Payer: Self-pay

## 2021-09-28 DIAGNOSIS — M5412 Radiculopathy, cervical region: Secondary | ICD-10-CM

## 2021-09-28 DIAGNOSIS — M542 Cervicalgia: Secondary | ICD-10-CM | POA: Diagnosis present

## 2021-09-28 NOTE — Patient Instructions (Signed)
Access Code: [email or web address] URL: [email or web address] Date: 09/28/2021 Prepared by: Nusaybah Ivie Hartnett-Rands  Exercises Supine Chin Tuck - 1 x daily - 7 x weekly - 3 sets - 10 reps Supine Cervical Rotation Passive Unloaded - 1 x daily - 7 x weekly - 3 sets - 10 reps Supine Cervical Retraction with Towel - 1 x daily - 7 x weekly - 3 sets - 10 reps
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
## 2021-09-30 ENCOUNTER — Ambulatory Visit (HOSPITAL_COMMUNITY): Payer: 59 | Attending: Family Medicine

## 2021-09-30 ENCOUNTER — Encounter (HOSPITAL_COMMUNITY): Payer: Self-pay

## 2021-09-30 ENCOUNTER — Other Ambulatory Visit: Payer: Self-pay

## 2021-09-30 DIAGNOSIS — M5412 Radiculopathy, cervical region: Secondary | ICD-10-CM | POA: Diagnosis not present

## 2021-09-30 DIAGNOSIS — M542 Cervicalgia: Secondary | ICD-10-CM | POA: Insufficient documentation

## 2021-09-30 NOTE — Patient Instructions (Signed)
Access Code: [email or web address] ?URL: [email or web address] ?Date: 09/30/2021 ?Prepared by: Jerilynn Som ? ?Exercises ?Cervical Retraction at Wall - 3 x daily - 7 x weekly - 2 sets - 5 reps ?Seated Shoulder Shrugs - 3 x daily - 7 x weekly - 2 sets - 10 reps ?Shoulder Blade Squeeze with Arms Up "No Monies" - 3 x daily - 7 x weekly - 2 sets - 10 reps ?Backward shoulder rolls - 3 x daily - 7 x weekly - 2 sets - 10 reps ?Neck Rotation - 3 x daily - 7 x weekly - 2 sets - 10 reps ? ?
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
## 2021-10-01 ENCOUNTER — Ambulatory Visit: Payer: 59 | Admitting: Orthopedic Surgery

## 2021-10-02 ENCOUNTER — Telehealth: Payer: Self-pay | Admitting: Physical Medicine and Rehabilitation

## 2021-10-02 ENCOUNTER — Ambulatory Visit: Payer: 59 | Admitting: Physician Assistant

## 2021-10-02 NOTE — Telephone Encounter (Signed)
Pt called asking for a call back from [facility]. Want to know if upcoming appt is for injection. Please call pt at [phone]. ?

## 2021-10-07 ENCOUNTER — Encounter (HOSPITAL_COMMUNITY): Payer: Self-pay

## 2021-10-07 ENCOUNTER — Ambulatory Visit (HOSPITAL_COMMUNITY): Payer: 59

## 2021-10-07 ENCOUNTER — Ambulatory Visit (INDEPENDENT_AMBULATORY_CARE_PROVIDER_SITE_OTHER): Payer: 59 | Admitting: Physical Medicine and Rehabilitation

## 2021-10-07 ENCOUNTER — Other Ambulatory Visit: Payer: Self-pay

## 2021-10-07 ENCOUNTER — Ambulatory Visit: Payer: 59 | Admitting: Physical Medicine and Rehabilitation

## 2021-10-07 ENCOUNTER — Encounter: Payer: Self-pay | Admitting: Physical Medicine and Rehabilitation

## 2021-10-07 VITALS — BP 128/76 | HR 88

## 2021-10-07 DIAGNOSIS — M5412 Radiculopathy, cervical region: Secondary | ICD-10-CM

## 2021-10-07 DIAGNOSIS — M7918 Myalgia, other site: Secondary | ICD-10-CM | POA: Diagnosis not present

## 2021-10-07 DIAGNOSIS — M542 Cervicalgia: Secondary | ICD-10-CM

## 2021-10-07 DIAGNOSIS — M797 Fibromyalgia: Secondary | ICD-10-CM | POA: Diagnosis not present

## 2021-10-07 MED ORDER — CYCLOBENZAPRINE HCL 10 MG PO TABS: 10.0000 mg | ORAL_TABLET | Freq: Every evening | ORAL | 0 refills | Status: DC | PRN

## 2021-10-07 NOTE — Progress Notes (Signed)
Hannah Short - 66 y.o. female MRN [id number]  Date of birth: 02-Sep-1955  Office Visit Note: Visit Date: 10/07/2021 PCP: Lanelle Bal, PA-C Referred by: Newt Minion, MD  Subjective: Chief Complaint  Patient presents with   Neck - Pain   Right Shoulder - Pain   Left Shoulder - Pain   Right Arm - Pain   Left Arm - Pain   Right Hand - Pain   HPI: Hannah Short is a 66 y.o. female who comes in today at the request of Dr. Meridee Score for evaluation of chronic, worsening and severe bilateral neck pain radiating to shoulders and down right arm to fingers. Patient reports pain has been ongoing for several months. She states pain is exacerbated by movement and activity, describes as soreness, tingling and stabbing pain, currently rates as 8 out of 10. Patient reports some relief of pain with home exercise regimen, massage therapy, and use of Tylenol as needed. Patient is currently undergoing formal physical therapy at [facility] and reports some relief of pain with these treatments. Patients recent cervical MRI exhibits mild for age cervical spine degeneration, mild to moderate right C6 foraminal narrowing, no high grade spinal canal stenosis noted. Patient states she care for her daughter whom has spina bifida and her severe pain is making it difficult to perform tasks. Patient also reports chronic bilateral knee issues that is currently being managed by Dr. Sharol Given, states last bilateral knee injection was in 2021. Patient has seen Dr. Suella Broad at [facility] in the past for chronic lower back issues and has received lumbar epidural steroid injections. Patient denies focal weakness. Patient denies recent trauma or falls.    Review of Systems  Musculoskeletal:  Positive for myalgias and neck pain.  Neurological:  Positive for tingling. Negative for sensory change, focal weakness and weakness.  All other systems reviewed and are negative. Otherwise per HPI.  Assessment &  Plan: Visit Diagnoses:    ICD-10-CM   1. Radiculopathy, cervical region  M54.12 Ambulatory referral to Physical Medicine Rehab    2. Cervicalgia  M54.2 Ambulatory referral to Physical Medicine Rehab    3. Myofascial pain syndrome  M79.18 Ambulatory referral to Physical Medicine Rehab    4. Fibromyalgia  M79.7 Ambulatory referral to Physical Medicine Rehab       Plan: Findings:  Chronic, worsening and severe bilateral neck pain radiating to shoulders and down right arm to fingers.  Patient continues to have excruciating and debilitating pain despite good conservative therapies such as formal physical therapy, home exercise regimen, massage therapy and use of Tylenol as needed.  Patient's clinical presentation and exam are consistent with C6 nerve pattern. We also feel her pain could be myofascial in nature and could be exacerbated by her fibromyalgia. She does have multiple trigger points noted to right trapezius and rhomboid muscles upon exam. We feel the next step is to perform a diagnostic and hopefully therapeutic right C7-T1 interlaminar epidural steroid injection under fluoroscopic guidance. Patient is not currently undergoing long term anticoagulant therapy. We recommend patient continue with formal physical therapy and also feel she would benefit from dry needling. We did talk about medication management and placed a prescription for Flexeril to take as needed for muscle spasms at bedtime. No red flag symptoms noted upon exam.    Meds & Orders:  Meds ordered this encounter  Medications   cyclobenzaprine (FLEXERIL) 10 MG tablet    Sig: Take 1 tablet (10 mg total)  by mouth at bedtime as needed for muscle spasms.    Dispense:  30 tablet    Refill:  0    Order Specific Question:   Supervising Provider    Answer:   Magnus Sinning [[id number]]    Orders Placed This Encounter  Procedures   Ambulatory referral to Physical Medicine Rehab    Follow-up: Return for Right C7-T1 interlaminar  epidural steroid injection.   Procedures: No procedures performed      Clinical History: MRI CERVICAL SPINE WITHOUT CONTRAST   TECHNIQUE: Multiplanar, multisequence MR imaging of the cervical spine was performed. No intravenous contrast was administered.   COMPARISON:  Cervical spine radiographs 07/20/2021.   FINDINGS: Alignment: Straightening of cervical lordosis compared to the December radiographs. No spondylolisthesis.   Vertebrae: No marrow edema or evidence of acute osseous abnormality. Visualized bone marrow signal is within normal limits.   Cord: Normal. Capacious cervical and visible upper thoracic spinal canal.   Posterior Fossa, vertebral arteries, paraspinal tissues: Cervicomedullary junction is within normal limits. Negative visible posterior fossa. Preserved major vascular flow voids in the neck. Partially retropharyngeal course of the ICAs at the C2-C3 level. Dominant right vertebral artery. Negative visible neck soft tissues and left lung apex.   Disc levels:   C2-C3:  Negative.   C3-C4:  Negative.   C4-C5:  Negative.   C5-C6: Subtle disc space loss and circumferential disc bulging. Mild endplate spurring most pronounced at the right foramen. Mild facet hypertrophy on the right. No spinal stenosis. Mild to moderate right C6 foraminal stenosis.   C6-C7:  Subtle circumferential disc bulging. No stenosis.   C7-T1:  Mild facet hypertrophy greater on the right. No stenosis.   IMPRESSION: 1. Mild for age cervical spine degeneration, primarily limited to C5-C6 where mild disc bulging and endplate spurring combine for mild to moderate right C6 neural foraminal stenosis. 2. Capacious cervical spinal canal and no spinal stenosis.     Electronically Signed   By: Genevie Ann [name]   On: 09/24/2021 11:31   She reports that she has never smoked. She has never used smokeless tobacco.  Recent Labs    06/17/21 1114  HGBA1C 5.5    Objective:  VS:  HT:      WT:    BMI:      BP:128/76   HR:88bpm   TEMP: ( )   RESP:  Physical Exam Vitals and nursing note reviewed.  HENT:     Head: Normocephalic and atraumatic.     Right Ear: External ear normal.     Left Ear: External ear normal.     Nose: Nose normal.     Mouth/Throat:     Mouth: Mucous membranes are moist.  Eyes:     Extraocular Movements: Extraocular movements intact.  Cardiovascular:     Rate and Rhythm: Normal rate.     Pulses: Normal pulses.  Pulmonary:     Effort: Pulmonary effort is normal.  Abdominal:     General: Abdomen is flat. There is no distension.  Musculoskeletal:        General: Tenderness present.     Cervical back: Tenderness present.     Comments: Discomfort noted with flexion, extension and side-to-side rotation. Patient has good strength in the upper extremities including 5 out of 5 strength in wrist extension, long finger flexion and APB. There is no atrophy of the hands intrinsically. Dysesthesias noted to right C6 dermatome. Sensation intact bilaterally. Multiple palpable trigger points noted to right trapezius and  rhomboid muscles. Negative Hoffman's sign.   Skin:    General: Skin is warm and dry.     Capillary Refill: Capillary refill takes less than 2 seconds.  Neurological:     General: No focal deficit present.     Mental Status: She is alert and oriented to person, place, and time.  Psychiatric:        Mood and Affect: Mood normal.        Behavior: Behavior normal.    Ortho Exam  Imaging: No results found.  Past Medical/Family/Surgical/Social History: Medications & Allergies reviewed per EMR, new medications updated. Patient Active Problem List   Diagnosis Date Noted   Essential hypertension 03/04/2020   Morbid obesity ([place]) 03/04/2020   Dyspnea on exertion 11/28/2019   Abdominal pain 11/19/2016   Change in bowel habits 11/19/2016   Atypical chest pain 11/19/2016   Bloating 11/19/2016   Migraine    Fibromyalgia    Past Medical History:   Diagnosis Date   Acute torn meniscus of knee, left, initial encounter 2019   Anemia    Anxiety    Arthritis    Arthritis    Back pain    Bulging lumbar disc    Chest pain    Chronically dry eyes, bilateral    Constipation    Depression    Diverticulosis    Fatty liver    Fibromyalgia    Fibromyalgia    Gallstones    GERD (gastroesophageal reflux disease)    Heart murmur    mitral valve prolapse   Hiatal hernia    Hypertension    Internal hemorrhoids    Joint pain    Lactose intolerance    Lipoma of colon    Migraine    Mitral valve prolapse    Neck pain    L3 4 and 5 bulging discs   Shortness of breath    Tubular adenoma of colon    Family History  Problem Relation Age of Onset   Heart failure Mother    Heart failure Father    Colon cancer Father    Hypertension Father    Cancer Father    Past Surgical History:  Procedure Laterality Date   CESAREAN SECTION     HERNIA REPAIR     1997   TOE SURGERY  1990   stepped on toothpick   Social History   Occupational History   Occupation: CNA  Tobacco Use   Smoking status: Never   Smokeless tobacco: Never  Substance and Sexual Activity   Alcohol use: No   Drug use: No   Sexual activity: Not on file

## 2021-10-07 NOTE — Progress Notes (Signed)
Pt state neck pain that travels down both shoulder, arms fingers. Pt state any movement makes the pain worse. Pt state its a deep pain that kept her up at night. Pt state she takes over the counter pain meds to help ease her pain. Pt has been to PT and uses heat / ice to help ease the pain. ? ?Numeric Pain Rating Scale and Functional Assessment ?Average Pain 10 ?Pain Right Now 6 ?My pain is intermittent, constant, burning, dull, and aching ?Pain is worse with: walking, bending, sitting, standing, some activites, and laying down ?Pain improves with: heat/ice, therapy/exercise, and medication ? ? ?In the last MONTH (on 0-10 scale) has pain interfered with the following? ? ?1. General activity like being  able to carry out your everyday physical activities such as walking, climbing stairs, carrying groceries, or moving a chair?  ?Rating(7) ? ?2. Relation with others like being able to carry out your usual social activities and roles such as  activities at home, at work and in your community. ?Rating(8) ? ?3. Enjoyment of life such that you have  been bothered by emotional problems such as feeling anxious, depressed or irritable?  ?Rating(9) ? ?
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
## 2021-10-09 ENCOUNTER — Encounter (HOSPITAL_COMMUNITY): Payer: 59

## 2021-10-14 ENCOUNTER — Ambulatory Visit (HOSPITAL_COMMUNITY): Payer: 59

## 2021-10-14 ENCOUNTER — Encounter (HOSPITAL_COMMUNITY): Payer: Self-pay

## 2021-10-14 ENCOUNTER — Other Ambulatory Visit: Payer: Self-pay

## 2021-10-14 DIAGNOSIS — M5412 Radiculopathy, cervical region: Secondary | ICD-10-CM

## 2021-10-14 DIAGNOSIS — M542 Cervicalgia: Secondary | ICD-10-CM
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
## 2021-10-16 ENCOUNTER — Encounter (HOSPITAL_COMMUNITY): Payer: 59

## 2021-10-21 ENCOUNTER — Ambulatory Visit: Payer: 59 | Admitting: Physician Assistant

## 2021-10-22 ENCOUNTER — Ambulatory Visit (HOSPITAL_COMMUNITY): Payer: 59

## 2021-10-22 ENCOUNTER — Other Ambulatory Visit: Payer: Self-pay

## 2021-10-22 ENCOUNTER — Encounter (HOSPITAL_COMMUNITY): Payer: Self-pay

## 2021-10-22 DIAGNOSIS — M5412 Radiculopathy, cervical region: Secondary | ICD-10-CM | POA: Diagnosis not present

## 2021-10-22 DIAGNOSIS — M542 Cervicalgia: Secondary | ICD-10-CM
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
## 2021-10-23 ENCOUNTER — Encounter (HOSPITAL_COMMUNITY): Payer: 59

## 2021-10-28 ENCOUNTER — Encounter (HOSPITAL_COMMUNITY): Payer: Self-pay | Admitting: Physical Therapy

## 2021-10-28 ENCOUNTER — Ambulatory Visit (HOSPITAL_COMMUNITY): Payer: 59 | Admitting: Physical Therapy

## 2021-10-28 ENCOUNTER — Other Ambulatory Visit: Payer: Self-pay

## 2021-10-28 DIAGNOSIS — M542 Cervicalgia: Secondary | ICD-10-CM

## 2021-10-28 DIAGNOSIS — M5412 Radiculopathy, cervical region: Secondary | ICD-10-CM
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
## 2021-10-30 ENCOUNTER — Encounter (HOSPITAL_COMMUNITY): Payer: 59

## 2021-11-04 ENCOUNTER — Encounter (HOSPITAL_COMMUNITY): Payer: Self-pay | Admitting: Physical Therapy

## 2021-11-04 ENCOUNTER — Ambulatory Visit (HOSPITAL_COMMUNITY): Payer: 59 | Attending: Family Medicine | Admitting: Physical Therapy

## 2021-11-04 DIAGNOSIS — M542 Cervicalgia: Secondary | ICD-10-CM | POA: Insufficient documentation

## 2021-11-04 DIAGNOSIS — M5412 Radiculopathy, cervical region: Secondary | ICD-10-CM | POA: Insufficient documentation
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
## 2021-11-11 ENCOUNTER — Ambulatory Visit (HOSPITAL_COMMUNITY): Payer: 59 | Admitting: Physical Therapy

## 2021-11-11 ENCOUNTER — Encounter (HOSPITAL_COMMUNITY): Payer: Self-pay | Admitting: Physical Therapy

## 2021-11-11 DIAGNOSIS — M5412 Radiculopathy, cervical region: Secondary | ICD-10-CM | POA: Diagnosis not present

## 2021-11-11 DIAGNOSIS — M542 Cervicalgia: Secondary | ICD-10-CM
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
## 2021-11-18 ENCOUNTER — Encounter (HOSPITAL_COMMUNITY): Payer: 59

## 2021-11-23 ENCOUNTER — Ambulatory Visit (INDEPENDENT_AMBULATORY_CARE_PROVIDER_SITE_OTHER): Payer: 59 | Admitting: Physical Medicine and Rehabilitation

## 2021-11-23 ENCOUNTER — Encounter: Payer: Self-pay | Admitting: Physical Medicine and Rehabilitation

## 2021-11-23 ENCOUNTER — Ambulatory Visit: Payer: Self-pay

## 2021-11-23 VITALS — BP 113/78 | HR 94

## 2021-11-23 DIAGNOSIS — M5412 Radiculopathy, cervical region: Secondary | ICD-10-CM | POA: Diagnosis not present

## 2021-11-23 MED ORDER — METHYLPREDNISOLONE ACETATE 80 MG/ML IJ SUSP
80.0000 mg | Freq: Once | INTRAMUSCULAR | Status: AC
  Administered 2021-11-23: 80 mg

## 2021-11-23 NOTE — Patient Instructions (Signed)

## 2021-11-23 NOTE — Progress Notes (Signed)
Pt state neck pain that travels down right arm and hand. Pt state any movement makes the pain worse. Pt state she takes pain meds and goes to PT to help ease her pain. ? ?Numeric Pain Rating Scale and Functional Assessment ?Average Pain 2 ? ? ?In the last MONTH (on 0-10 scale) has pain interfered with the following? ? ?1. General activity like being  able to carry out your everyday physical activities such as walking, climbing stairs, carrying groceries, or moving a chair?  ?Rating(10) ? ? ?+Driver, -BT, +Dye Allergies. ? ?

## 2021-11-25 ENCOUNTER — Telehealth: Payer: Self-pay | Admitting: Physical Medicine and Rehabilitation

## 2021-11-25 ENCOUNTER — Encounter (HOSPITAL_COMMUNITY): Payer: 59

## 2021-11-25 NOTE — Telephone Encounter (Signed)
Patient called advised she is having really bad pain in her lower back. Patient asked if she can take Flexeril and Tylenol together? The number to contact patient is [phone] ?

## 2021-12-08 NOTE — Procedures (Signed)
Cervical Epidural Steroid Injection - Interlaminar Approach with Fluoroscopic Guidance ? ?Patient: Hannah Short      ?Date of Birth: 1955/12/10 ?MRN: [id number] ?PCP: Lanelle Bal, PA-C      ?Visit Date: 11/23/2021 ?  ?Universal Protocol:    ?Date/Time: 05/09/235:49 PM ? ?Consent Given By: the patient ? ?Position: PRONE ? ?Additional Comments: ?Vital signs were monitored before and after the procedure. ?Patient was prepped and draped in the usual sterile fashion. ?The correct patient, procedure, and site was verified. ? ? ?Injection Procedure Details:  ? ?Procedure diagnoses: Radiculopathy, cervical region [M54.12]   ? ?Meds Administered:  ?Meds ordered this encounter  ?Medications  ? methylPREDNISolone acetate (DEPO-MEDROL) injection 80 mg  ?  ? ?Laterality: Right ? ?Location/Site: C7-T1 ? ?Needle: 3.5 in., 20 ga. Tuohy ? ?Needle Placement: Paramedian epidural space ? ?Findings: ? -Comments: Excellent flow of contrast into the epidural space. ? ?Procedure Details: ?Using a paramedian approach from the side mentioned above, the region overlying the inferior lamina was localized under fluoroscopic visualization and the soft tissues overlying this structure were infiltrated with 4 ml. of 1% Lidocaine without Epinephrine. A # 20 gauge, Tuohy needle was inserted into the epidural space using a paramedian approach. ? ?The epidural space was localized using loss of resistance along with contralateral oblique bi-planar fluoroscopic views.  After negative aspirate for air, blood, and CSF, a 2 ml. volume of Isovue-250 was injected into the epidural space and the flow of contrast was observed. Radiographs were obtained for documentation purposes.  ? ?The injectate was administered into the level noted above. ? ?Additional Comments:  ?The patient tolerated the procedure well ?Dressing: 2 x 2 sterile gauze and Band-Aid ?  ? ?Post-procedure details: ?Patient was observed during the procedure. ?Post-procedure instructions were  reviewed. ? ?Patient left the clinic in stable condition.  ?

## 2021-12-08 NOTE — Progress Notes (Signed)
? ?[name] - 66 y.o. female MRN [id number]  Date of birth: 1955/08/06 ? ?Office Visit Note: ?Visit Date: 11/23/2021 ?PCP: Lanelle Bal, PA-C ?Referred by: Lanelle Bal, PA-C ? ?Subjective: ?Chief Complaint  ?Patient presents with  ? Neck - Pain  ? Right Shoulder - Pain  ? Right Hand - Pain  ? ?HPI:  Hannah Short is a 66 y.o. female who comes in today at the request of Barnet Pall, FNP for planned Right C7-T1 Cervical Interlaminar epidural steroid injection with fluoroscopic guidance.  The patient has failed conservative care including home exercise, medications, time and activity modification.  This injection will be diagnostic and hopefully therapeutic.  Please see requesting physician notes for further details and justification. ? ?ROS Otherwise per HPI. ? ?Assessment & Plan: ?Visit Diagnoses:  ?  ICD-10-CM   ?1. Radiculopathy, cervical region  M54.12 XR C-ARM NO REPORT  ?  Epidural Steroid injection  ?  methylPREDNISolone acetate (DEPO-MEDROL) injection 80 mg  ?  ?  ?Plan: No additional findings.  ? ?Meds & Orders:  ?Meds ordered this encounter  ?Medications  ? methylPREDNISolone acetate (DEPO-MEDROL) injection 80 mg  ?  ?Orders Placed This Encounter  ?Procedures  ? XR C-ARM NO REPORT  ? Epidural Steroid injection  ?  ?Follow-up: Return if symptoms worsen or fail to improve.  ? ?Procedures: ?No procedures performed  ?Cervical Epidural Steroid Injection - Interlaminar Approach with Fluoroscopic Guidance ? ?Patient: Hannah Short      ?Date of Birth: [birth year] ?MRN: [id number] ?PCP: Lanelle Bal, PA-C      ?Visit Date: 11/23/2021 ?  ?Universal Protocol:    ?Date/Time: 05/09/235:49 PM ? ?Consent Given By: the patient ? ?Position: PRONE ? ?Additional Comments: ?Vital signs were monitored before and after the procedure. ?Patient was prepped and draped in the usual sterile fashion. ?The correct patient, procedure, and site was verified. ? ? ?Injection Procedure Details:  ? ?Procedure diagnoses:  Radiculopathy, cervical region [M54.12]   ? ?Meds Administered:  ?Meds ordered this encounter  ?Medications  ? methylPREDNISolone acetate (DEPO-MEDROL) injection 80 mg  ?  ? ?Laterality: Right ? ?Location/Site: C7-T1 ? ?Needle: 3.5 in., 20 ga. Tuohy ? ?Needle Placement: Paramedian epidural space ? ?Findings: ? -Comments: Excellent flow of contrast into the epidural space. ? ?Procedure Details: ?Using a paramedian approach from the side mentioned above, the region overlying the inferior lamina was localized under fluoroscopic visualization and the soft tissues overlying this structure were infiltrated with 4 ml. of 1% Lidocaine without Epinephrine. A # 20 gauge, Tuohy needle was inserted into the epidural space using a paramedian approach. ? ?The epidural space was localized using loss of resistance along with contralateral oblique bi-planar fluoroscopic views.  After negative aspirate for air, blood, and CSF, a 2 ml. volume of Isovue-250 was injected into the epidural space and the flow of contrast was observed. Radiographs were obtained for documentation purposes.  ? ?The injectate was administered into the level noted above. ? ?Additional Comments:  ?The patient tolerated the procedure well ?Dressing: 2 x 2 sterile gauze and Band-Aid ?  ? ?Post-procedure details: ?Patient was observed during the procedure. ?Post-procedure instructions were reviewed. ? ?Patient left the clinic in stable condition.   ? ?Clinical History: ?MRI CERVICAL SPINE WITHOUT CONTRAST  ?   ?TECHNIQUE:  ?Multiplanar, multisequence MR imaging of the cervical spine was  ?performed. No intravenous contrast was administered.  ?   ?COMPARISON:  Cervical spine radiographs 07/20/2021.  ?   ?FINDINGS:  ?Alignment: Straightening  of cervical lordosis compared to the  ?December radiographs. No spondylolisthesis.  ?   ?Vertebrae: No marrow edema or evidence of acute osseous abnormality.  ?Visualized bone marrow signal is within normal limits.  ?   ?Cord:  Normal. Capacious cervical and visible upper thoracic spinal  ?canal.  ?   ?Posterior Fossa, vertebral arteries, paraspinal tissues:  ?Cervicomedullary junction is within normal limits. Negative visible  ?posterior fossa. Preserved major vascular flow voids in the neck.  ?Partially retropharyngeal course of the ICAs at the C2-C3 level.  ?Dominant right vertebral artery. Negative visible neck soft tissues  ?and left lung apex.  ?   ?Disc levels:  ?   ?C2-C3:  Negative.  ?   ?C3-C4:  Negative.  ?   ?C4-C5:  Negative.  ?   ?C5-C6: Subtle disc space loss and circumferential disc bulging. Mild  ?endplate spurring most pronounced at the right foramen. Mild facet  ?hypertrophy on the right. No spinal stenosis. Mild to moderate right  ?C6 foraminal stenosis.  ?   ?C6-C7:  Subtle circumferential disc bulging. No stenosis.  ?   ?C7-T1:  Mild facet hypertrophy greater on the right. No stenosis.  ?   ?IMPRESSION:  ?1. 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  ?2. Capacious cervical spinal canal and no spinal stenosis.  ?   ?   ?Electronically Signed  ?  By: Genevie Ann [name]  ?  On: 09/24/2021 11:31  ? ? ? ?Objective:  VS:  HT:    WT:   BMI:     BP:113/78  HR:94bpm  TEMP: ( )  RESP:  ?Physical Exam ?Vitals and nursing note reviewed.  ?Constitutional:   ?   General: She is not in acute distress. ?   Appearance: Normal appearance. She is not ill-appearing.  ?HENT:  ?   Head: Normocephalic and atraumatic.  ?   Right Ear: External ear normal.  ?   Left Ear: External ear normal.  ?Eyes:  ?   Extraocular Movements: Extraocular movements intact.  ?Cardiovascular:  ?   Rate and Rhythm: Normal rate.  ?   Pulses: Normal pulses.  ?Musculoskeletal:  ?   Cervical back: Tenderness present. No rigidity.  ?   Right lower leg: No edema.  ?   Left lower leg: No edema.  ?   Comments: Patient has good  strength in the upper extremities including 5 out of 5 strength in wrist extension long finger flexion and APB.  There is no atrophy of the hands intrinsically.  There is a negative Hoffmann's test. ?  ?Lymphadenopathy:  ?   Cervical: No cervical adenopathy.  ?Skin: ?   Findings: No erythema, lesion or rash.  ?Neurological:  ?   General: No focal deficit present.  ?   Mental Status: She is alert and oriented to person, place, and time.  ?   Sensory: No sensory deficit.  ?   Motor: No weakness or abnormal muscle tone.  ?   Coordination: Coordination normal.  ?Psychiatric:     ?   Mood and Affect: Mood normal.     ?   Behavior: Behavior normal.  ?  ? ?Imaging: ?No results found. ?

## 2021-12-21 ENCOUNTER — Telehealth: Payer: Self-pay | Admitting: Internal Medicine

## 2021-12-21 ENCOUNTER — Encounter: Payer: Self-pay | Admitting: Internal Medicine

## 2021-12-21 ENCOUNTER — Ambulatory Visit (INDEPENDENT_AMBULATORY_CARE_PROVIDER_SITE_OTHER): Payer: 59 | Admitting: Internal Medicine

## 2021-12-21 VITALS — BP 128/76 | HR 69 | Ht 64.0 in | Wt 188.8 lb

## 2021-12-21 DIAGNOSIS — R1310 Dysphagia, unspecified: Secondary | ICD-10-CM | POA: Diagnosis not present

## 2021-12-21 DIAGNOSIS — R140 Abdominal distension (gaseous): Secondary | ICD-10-CM | POA: Diagnosis not present

## 2021-12-21 DIAGNOSIS — K219 Gastro-esophageal reflux disease without esophagitis: Secondary | ICD-10-CM | POA: Diagnosis not present

## 2021-12-21 DIAGNOSIS — Z800 Family history of malignant neoplasm of digestive organs: Secondary | ICD-10-CM

## 2021-12-21 MED ORDER — TRULANCE 3 MG PO TABS: 1.0000 | ORAL_TABLET | Freq: Every day | ORAL | 2 refills | Status: DC

## 2021-12-21 NOTE — Patient Instructions (Signed)
You have been scheduled for an endoscopy and colonoscopy. Please follow the written instructions given to you at your visit today. Please pick up your prep supplies at the pharmacy within the next 1-3 days. If you use inhalers (even only as needed), please bring them with you on the day of your procedure.  Please discontinue Linzess.  Discontinue the probiotic.  We have sent the following medications to your pharmacy for you to pick up at your convenience: Trulance daily  If you are age 65 or older, your body mass index should be between 23-30. Your Body mass index is 32.41 kg/m. If this is out of the aforementioned range listed, please consider follow up with your Primary Care Provider.  If you are age 65 or younger, your body mass index should be between 19-25. Your Body mass index is 32.41 kg/m. If this is out of the aformentioned range listed, please consider follow up with your Primary Care Provider.   ________________________________________________________  The [facility] providers would like to encourage you to use [facility] to communicate with providers for non-urgent requests or questions.  Due to long hold times on the telephone, sending your provider a message by [facility] may be a faster and more efficient way to get a response.  Please allow 48 business hours for a response.  Please remember that this is for non-urgent requests.  _______________________________________________________  Due to recent changes in healthcare laws, you may see the results of your imaging and laboratory studies on MyChart before your provider has had a chance to review them.  We understand that in some cases there may be results that are confusing or concerning to you. Not all laboratory results come back in the same time frame and the provider may be waiting for multiple results in order to interpret others.  Please give [place] 48 hours in order for your provider to thoroughly review all the results before  contacting the office for clarification of your results.

## 2021-12-21 NOTE — Telephone Encounter (Signed)
Prior Hannah Short note has already been faxed to our prior auth department within the last hour or so. Advised patient that prior auth department will be working on this and we will hopefully receive a response from insurance within 72 hours or so. She verbalizes understanding.

## 2021-12-21 NOTE — Progress Notes (Signed)
Subjective:    Patient ID: [name], female    DOB: 05-Sep-1955, 66 y.o.   MRN: [id number]  HPI Jaylyn Iyer is a 66 year old female with a past medical history of GERD with mild reflux esophagitis, 3 cm hiatal hernia, nonadvanced adenoma of the colon, family history of colon cancer in her father, diverticulosis, hypertension, fibromyalgia who is seen to evaluate abdominal bloating.  She is here alone today and was last seen in January 2022.  In January 2022 I saw her for increasing abdominal girth and bloating.  CT scan abdomen pelvis was performed which did not reveal a specific cause for pain or bloating.  Hepatic steatosis was seen, cholelithiasis, multiple hepatic cysts, abdominal wall mesh.  She reports that she has been taking Linzess and acidophilus.  With Linzess stools are urgent and watery but at least she is able to go to the bathroom without severe straining.  She has bloating throughout the day which is worse as the day wears on and after meals.  She reports being told by people that she looks "pregnant".  Without Linzess she has a very hard time defecating.  She has to strain and push hard.  This can also be associated with diaphoresis.  There is been no blood in stool or melena.  No heartburn, nausea or vomiting.  She does take omeprazole 40 mg daily.  Occasionally she has some mild liquid dysphagia symptom.  Her brother had a partial colon resection for unclear reason.  He had a temporary ostomy but this was taken down.  Her father had colon cancer.   Review of Systems As per HPI, otherwise negative  Current Medications, Allergies, Past Medical History, Past Surgical History, Family History and Social History were reviewed in [employer] record.    Objective:   Physical Exam BP 128/76   Pulse 69   Ht '5\' 4"'$  (1.626 m)   Wt 188 lb 12.8 oz (85.6 kg)   SpO2 97%   BMI 32.41 kg/m  Gen: awake, alert, NAD HEENT: anicteric CV: RRR, no mrg Pulm: CTA  b/l Abd: soft, protuberant, mildly tender throughout, well-healed ex lap scar, +BS throughout Ext: no c/c/e Neuro: nonfocal   CT ABDOMEN AND PELVIS WITH CONTRAST   TECHNIQUE: Multidetector CT imaging of the abdomen and pelvis was performed using the standard protocol following bolus administration of intravenous contrast.   CONTRAST:  137m OMNIPAQUE IOHEXOL 300 MG/ML  SOLN   COMPARISON:  Abdominal ultrasound 10/04/2019 and report from CT abdomen from 08/25/2015   FINDINGS: Lower chest: A 7 by 4 by 5 mm (volume = 70 mm^3) right lower lobe nodule is present on image 7 of series 4, stable from 12/18/2019. On the prior CT angiogram of the chest from 08/31/2017 which is currently not available, a 6 mm nodule was reported in the right lower lobe and probably corresponds to this finding. Minimal subsegmental atelectasis or scarring in the lingula.   Hepatobiliary: Suspected hepatic steatosis. Multiple hepatic cysts, most of which were present and of similar size on 11/08/2003.   Multiple gallstones in the gallbladder measuring up to 2.6 cm in diameter. No appreciable gallbladder wall thickening or biliary dilatation.   Pancreas: Unremarkable   Spleen: Unremarkable   Adrenals/Urinary Tract: Both adrenal glands appear normal. Hypodense 0.8 cm left kidney upper pole lesion corresponding to the cyst shown on 10/04/2019 ultrasound. Exophytic hypodense 1.3 cm left posterior mid kidney lesion on image 23 of series 2, corresponding to the cyst  shown on prior ultrasound, but with some mild complexity, measuring 16 Hounsfield units on portal venous phase images. No urinary tract calculi are currently identified.   Stomach/Bowel: Postoperative findings along the proximal stomach, query or hernia repair.   Vascular/Lymphatic: Aortoiliac atherosclerotic vascular disease. No pathologic adenopathy identified.   Reproductive: Unremarkable   Other: No supplemental non-categorized  findings.   Musculoskeletal: Anterior abdominal wall hernia mesh. No recurrent hernia.   IMPRESSION: 1. A specific cause for the patient's abdominal pain is not identified. 2. Other imaging findings of potential clinical significance: Suspected hepatic steatosis. Cholelithiasis. Multiple hepatic cysts. Anterior abdominal wall hernia mesh. A 7 by 4 by 5 mm right lower lobe pulmonary nodule is present on image 7 of series 4, stable from 12/18/2019. On the prior CT angiogram of the chest from 08/31/2017, a 6 mm nodule was reported in the right lower lobe and probably corresponds to this finding, which is likewise unchanged from 12/18/2019. This does not require further workup. 3. Aortic atherosclerosis.   Aortic Atherosclerosis (ICD10-I70.0).     Electronically Signed   By: Van Clines [name]   On: 08/14/2020 12:46         Assessment & Plan:  66 year old female with a past medical history of GERD with mild reflux esophagitis, 3 cm hiatal hernia, nonadvanced adenoma of the colon, family history of colon cancer in her father, diverticulosis, hypertension, fibromyalgia who is seen to evaluate abdominal bloating.   Abd bloating/increasing abd girth --symptoms consistent since last January and dating back prior to this.  I do think there is an element of constipation but I am also suspicious for abdomino phrenic dyssynergia.  Linzess is causing diarrhea and possibly even contributing to bloating. --Stop Linzess and probiotic --Trial of Trulance 3 mg daily --If persistent pelvic floor physical therapy  2.  GERD with liquid dysphagia --continue omeprazole 40 mg daily and perform upper endoscopy in the [facility].  3.  Personal history of small adenoma of the colon and family history of colon cancer in first-degree relative --colonoscopy in the [facility].  We reviewed the risk, benefits and alternatives to upper and lower endoscopy and she is agreeable and wishes to proceed.  30 minutes total  spent today including patient facing time, coordination of care, reviewing medical history/procedures/pertinent radiology studies, and documentation of the encounter.

## 2021-12-21 NOTE — Telephone Encounter (Signed)
Patient called stating she needs prior authorization in order to refill prescription for ''Trulance''. Please give patient a call back to advise.  Thank you

## 2021-12-22 ENCOUNTER — Other Ambulatory Visit (HOSPITAL_COMMUNITY): Payer: Self-pay

## 2021-12-22 ENCOUNTER — Telehealth: Payer: Self-pay | Admitting: Pharmacy Technician

## 2021-12-22 NOTE — Telephone Encounter (Signed)
Patient Advocate Encounter  Received notification from [place] that prior authorization for TRULANCE '3MG'$  is required.   PA submitted on 5.23.23 Key [id number] Status is pending   [facility] will continue to follow  Luciano Cutter, CPhT Patient Advocate Phone: [phone]

## 2022-01-05 ENCOUNTER — Telehealth: Payer: Self-pay | Admitting: Internal Medicine

## 2022-01-05 MED ORDER — PLENVU 140 G PO SOLR: 1.0000 | ORAL | 0 refills | Status: DC

## 2022-01-05 NOTE — Telephone Encounter (Signed)
Message sent to Rx PA team to check on status of PA for Trulance

## 2022-01-05 NOTE — Telephone Encounter (Signed)
Can you give [place] a status update on this PA? Thank you

## 2022-01-05 NOTE — Telephone Encounter (Signed)
Plenvu sent to pharmacy.  Called patient and let her know and apologized for delay.   Patient has started a new diet, The Next 56 Days.  She has lost 4 lbs since last Tuesday.  She is feeling better. She has had some rectal bleeding -bright red only.  She said if things keep up the way they are she might now even need the Trulance.  Patient will let us know if she has trouble getting the Plenvu for the procedure next week.

## 2022-01-05 NOTE — Telephone Encounter (Signed)
Inbound call from patient stating she is awaiting prior authorization for medication ''Trulance'' . Patient also states her prep medication has not been sent to the pharmacy. Patient is scheduled for a upcoming procedure 01/13/22. Please give patient a call back to advise .  Thank you

## 2022-01-06 ENCOUNTER — Other Ambulatory Visit (HOSPITAL_COMMUNITY): Payer: Self-pay

## 2022-01-06 NOTE — Telephone Encounter (Signed)
Called and informed patient that Trulance PA has been expedited and we will let her know when we hear back.

## 2022-01-08 NOTE — Telephone Encounter (Signed)
Received a fax regarding Prior Authorization from [facility] for [place] '3MG'$ . Authorization has been DENIED because PT HAS NOT TRIED/FAILED MIRALAX OR LACTULOSE.  Pt has a trial/fail of Miralax in 06/2021. Resubmitting a new PA request with additional information.  Patient Advocate Encounter  Received notification from [place] that prior authorization for TRULANCE '3MG'$  is required.   PA submitted on 6.9.23 (EXPEDITED) Key [id number] Status is pending   [facility] will continue to follow  Luciano Cutter, CPhT Patient Advocate Phone: [phone]

## 2022-01-13 ENCOUNTER — Encounter: Payer: Self-pay | Admitting: Internal Medicine

## 2022-01-13 ENCOUNTER — Ambulatory Visit (AMBULATORY_SURGERY_CENTER): Payer: 59 | Admitting: Internal Medicine

## 2022-01-13 VITALS — BP 135/59 | HR 63 | Temp 98.4°F | Resp 15 | Ht 64.0 in | Wt 188.0 lb

## 2022-01-13 DIAGNOSIS — K219 Gastro-esophageal reflux disease without esophagitis: Secondary | ICD-10-CM

## 2022-01-13 DIAGNOSIS — Z800 Family history of malignant neoplasm of digestive organs: Secondary | ICD-10-CM

## 2022-01-13 DIAGNOSIS — D123 Benign neoplasm of transverse colon: Secondary | ICD-10-CM

## 2022-01-13 DIAGNOSIS — D124 Benign neoplasm of descending colon: Secondary | ICD-10-CM

## 2022-01-13 DIAGNOSIS — K449 Diaphragmatic hernia without obstruction or gangrene: Secondary | ICD-10-CM

## 2022-01-13 DIAGNOSIS — Z09 Encounter for follow-up examination after completed treatment for conditions other than malignant neoplasm: Secondary | ICD-10-CM

## 2022-01-13 DIAGNOSIS — R140 Abdominal distension (gaseous): Secondary | ICD-10-CM | POA: Diagnosis not present

## 2022-01-13 DIAGNOSIS — Z86010 Personal history of colonic polyps: Secondary | ICD-10-CM

## 2022-01-13 MED ORDER — SODIUM CHLORIDE 0.9 % IV SOLN: 500.0000 mL | Freq: Once | INTRAVENOUS | Status: DC

## 2022-01-13 NOTE — Op Note (Addendum)
[place] Patient Name: Hannah Short Procedure Date: 01/13/2022 3:35 PM MRN: [id number] Endoscopist: Jerene Bears , MD Age: 66 Referring MD:  Date of Birth: 1955/11/30 Gender: Female Account #: [id number] Procedure:                Colonoscopy Indications:              High risk colon cancer surveillance: Personal                            history of non-advanced adenoma, Family history of                            colon cancer in a first-degree relative (father),                            Last colonoscopy: July 2018 Medicines:                Monitored Anesthesia Care Procedure:                Pre-Anesthesia Assessment:                           - Prior to the procedure, a History and Physical                            was performed, and patient medications and                            allergies were reviewed. The patient's tolerance of                            previous anesthesia was also reviewed. The risks                            and benefits of the procedure and the sedation                            options and risks were discussed with the patient.                            All questions were answered, and informed consent                            was obtained. Prior Anticoagulants: The patient has                            taken no previous anticoagulant or antiplatelet                            agents. ASA Grade Assessment: II - A patient with                            mild systemic disease. After reviewing the risks  and benefits, the patient was deemed in                            satisfactory condition to undergo the procedure.                           After obtaining informed consent, the colonoscope                            was passed under direct vision. Throughout the                            procedure, the patient's blood pressure, pulse, and                            oxygen saturations were monitored  continuously. The                            [id number]-HQ190L Colonoscope was introduced through the                            anus and advanced to the cecum, identified by                            appendiceal orifice and ileocecal valve. The                            colonoscopy was performed without difficulty. The                            patient tolerated the procedure well. The quality                            of the bowel preparation was good. The ileocecal                            valve, appendiceal orifice, and rectum were                            photographed. Scope In: 3:53:01 PM Scope Out: 4:16:03 PM Scope Withdrawal Time: 0 hours 13 minutes 6 seconds  Total Procedure Duration: 0 hours 23 minutes 2 seconds  Findings:                 The digital rectal exam was normal.                           There was a medium-sized lipoma, 10 mm in diameter,                            in the ascending colon.                           Two sessile polyps were found in the transverse  colon. The polyps were 3 to 4 mm in size. These                            polyps were removed with a cold snare. Resection                            and retrieval were complete.                           A 8 mm polyp was found in the descending colon. The                            polyp was sessile. The polyp was removed with a                            cold snare. Resection and retrieval were complete.                           Multiple small and large-mouthed diverticula were                            found in the sigmoid colon and descending colon.                           The retroflexed view of the distal rectum and anal                            verge was normal and showed no anal or rectal                            abnormalities. Complications:            No immediate complications. Estimated Blood Loss:     Estimated blood loss was minimal. Impression:                - Medium-sized lipoma in the ascending colon.                           - Two 3 to 4 mm polyps in the transverse colon,                            removed with a cold snare. Resected and retrieved.                           - One 8 mm polyp in the descending colon, removed                            with a cold snare. Resected and retrieved.                           - Diverticulosis in the sigmoid colon and in the  descending colon. Recommendation:           - Patient has a contact number available for                            emergencies. The signs and symptoms of potential                            delayed complications were discussed with the                            patient. Return to normal activities tomorrow.                            Written discharge instructions were provided to the                            patient.                           - Resume previous diet.                           - Continue present medications.                           - Await pathology results.                           - Repeat colonoscopy is recommended for                            surveillance. The colonoscopy date will be                            determined after pathology results from today's                            exam become available for review. Jerene Bears, MD 01/13/2022 4:21:59 PM This report has been signed electronically.

## 2022-01-13 NOTE — Op Note (Signed)
[place] Patient Name: Hannah Short Procedure Date: 01/13/2022 3:36 PM MRN: [id number] Endoscopist: Jerene Bears , MD Age: 66 Referring MD:  Date of Birth: April 30, 1956 Gender: Female Account #: [id number] Procedure:                Upper GI endoscopy Indications:              Gastro-esophageal reflux disease, Abdominal bloating Medicines:                Monitored Anesthesia Care Procedure:                Pre-Anesthesia Assessment:                           - Prior to the procedure, a History and Physical                            was performed, and patient medications and                            allergies were reviewed. The patient's tolerance of                            previous anesthesia was also reviewed. The risks                            and benefits of the procedure and the sedation                            options and risks were discussed with the patient.                            All questions were answered, and informed consent                            was obtained. Prior Anticoagulants: The patient has                            taken no previous anticoagulant or antiplatelet                            agents. ASA Grade Assessment: II - A patient with                            mild systemic disease. After reviewing the risks                            and benefits, the patient was deemed in                            satisfactory condition to undergo the procedure.                           After obtaining informed consent, the endoscope was  passed under direct vision. Throughout the                            procedure, the patient's blood pressure, pulse, and                            oxygen saturations were monitored continuously. The                            GIF HQ190 #[id number] was introduced through the                            mouth, and advanced to the second part of duodenum.                            The  upper GI endoscopy was accomplished without                            difficulty. The patient tolerated the procedure                            well. Scope In: Scope Out: Findings:                 The examined esophagus was normal.                           A 2 cm hiatal hernia was present.                           The entire examined stomach was normal.                           The examined duodenum was normal. Complications:            No immediate complications. Estimated Blood Loss:     Estimated blood loss: none. Impression:               - Normal esophagus.                           - 2 cm hiatal hernia.                           - Normal stomach.                           - Normal examined duodenum.                           - No specimens collected. Recommendation:           - Patient has a contact number available for                            emergencies. The signs and symptoms of potential  delayed complications were discussed with the                            patient. Return to normal activities tomorrow.                            Written discharge instructions were provided to the                            patient.                           - Resume previous diet.                           - Continue present medications.                           - See the other procedure note for documentation of                            additional recommendations. Jerene Bears, MD 01/13/2022 4:18:57 PM This report has been signed electronically.

## 2022-01-13 NOTE — Progress Notes (Signed)
Called to room to assist during endoscopic procedure.  Patient ID and intended procedure confirmed with present staff. Received instructions for my participation in the procedure from the performing physician.  

## 2022-01-13 NOTE — Progress Notes (Signed)
See office note dated 12/21/2021 for details and current H&P  Patient presenting for upper endoscopy and colonoscopy and she remains appropriate for these procedures in the [facility] today

## 2022-01-13 NOTE — Progress Notes (Signed)
To pacu, VSS. Report to [email or web address] 

## 2022-01-13 NOTE — Progress Notes (Signed)
Pt's states no medical or surgical changes since previsit or office visit. 

## 2022-01-13 NOTE — Patient Instructions (Signed)
Resume previous medications.  3 polyps removed and sent to pathology.  Await results for final recommendations.    Handouts on findings given to patient ( diverticulosis, polyps)  YOU HAD AN ENDOSCOPIC PROCEDURE TODAY AT [place]:   Refer to the procedure report that was given to you for any specific questions about what was found during the examination.  If the procedure report does not answer your questions, please call your gastroenterologist to clarify.  If you requested that your care partner not be given the details of your procedure findings, then the procedure report has been included in a sealed envelope for you to review at your convenience later.  YOU SHOULD EXPECT: Some feelings of bloating in the abdomen. Passage of more gas than usual.  Walking can help get rid of the air that was put into your GI tract during the procedure and reduce the bloating. If you had a lower endoscopy (such as a colonoscopy or flexible sigmoidoscopy) you may notice spotting of blood in your stool or on the toilet paper. If you underwent a bowel prep for your procedure, you may not have a normal bowel movement for a few days.  Please Note:  You might notice some irritation and congestion in your nose or some drainage.  This is from the oxygen used during your procedure.  There is no need for concern and it should clear up in a day or so.  SYMPTOMS TO REPORT IMMEDIATELY:  Following lower endoscopy (colonoscopy or flexible sigmoidoscopy):  Excessive amounts of blood in the stool  Significant tenderness or worsening of abdominal pains  Swelling of the abdomen that is new, acute  Fever of 100F or higher  Following upper endoscopy (EGD)  Vomiting of blood or coffee ground material  New chest pain or pain under the shoulder blades  Painful or persistently difficult swallowing  New shortness of breath  Fever of 100F or higher  Black, tarry-looking stools  For urgent or emergent issues, a  gastroenterologist can be reached at any hour by calling [phone]. Do not use MyChart messaging for urgent concerns.    DIET:  We do recommend a small meal at first, but then you may proceed to your regular diet.  Drink plenty of fluids but you should avoid alcoholic beverages for 24 hours.  ACTIVITY:  You should plan to take it easy for the rest of today and you should NOT DRIVE or use heavy machinery until tomorrow (because of the sedation medicines used during the test).    FOLLOW UP: Our staff will call the number listed on your records 24-72 hours following your procedure to check on you and address any questions or concerns that you may have regarding the information given to you following your procedure. If we do not reach you, we will leave a message.  We will attempt to reach you two times.  During this call, we will ask if you have developed any symptoms of COVID 19. If you develop any symptoms (ie: fever, flu-like symptoms, shortness of breath, cough etc.) before then, please call [phone].  If you test positive for Covid 19 in the 2 weeks post procedure, please call and report this information to [place].    If any biopsies were taken you will be contacted by phone or by letter within the next 1-3 weeks.  Please call us at [phone] if you have not heard about the biopsies in 3 weeks.    SIGNATURES/CONFIDENTIALITY: You and/or  your care partner have signed paperwork which will be entered into your electronic medical record.  These signatures attest to the fact that that the information above on your After Visit Summary has been reviewed and is understood.  Full responsibility of the confidentiality of this discharge information lies with you and/or your care-partner.

## 2022-01-14 ENCOUNTER — Telehealth: Payer: Self-pay | Admitting: *Deleted

## 2022-01-14 NOTE — Telephone Encounter (Signed)
  Follow up Call-     01/13/2022    2:28 PM  Call back number  Post procedure Call Back phone  # [phone]  Permission to leave phone message Yes     Patient questions:  Do you have a fever, pain , or abdominal swelling? No. Pain Score  0 *  Have you tolerated food without any problems? Yes.    Have you been able to return to your normal activities? Yes.    Do you have any questions about your discharge instructions: Diet   No. Medications  No. Follow up visit  No.  Do you have questions or concerns about your Care? No.  Actions: * If pain score is 4 or above: No action needed, pain <4.

## 2022-01-22 ENCOUNTER — Encounter: Payer: Self-pay | Admitting: Internal Medicine

## 2022-02-18 ENCOUNTER — Other Ambulatory Visit (HOSPITAL_COMMUNITY): Payer: Self-pay

## 2022-02-25 ENCOUNTER — Other Ambulatory Visit (HOSPITAL_COMMUNITY): Payer: Self-pay

## 2022-03-01 ENCOUNTER — Other Ambulatory Visit (HOSPITAL_COMMUNITY): Payer: Self-pay

## 2022-03-01 NOTE — Telephone Encounter (Signed)
Patient Advocate Encounter   Contacted [facility] regarding Environmental education officer.  Confirmed that this PA is still under review / PENDING, they are unable to provide a timeline for the reply at this time.  Will continue to follow.  Clista Bernhardt, CPhT Rx Patient Advocate Specialist Phone: [phone]

## 2022-03-03 ENCOUNTER — Other Ambulatory Visit: Payer: Self-pay | Admitting: Physical Medicine and Rehabilitation

## 2022-03-10 ENCOUNTER — Encounter (INDEPENDENT_AMBULATORY_CARE_PROVIDER_SITE_OTHER): Payer: Self-pay

## 2022-03-10 ENCOUNTER — Other Ambulatory Visit: Payer: Self-pay | Admitting: Cardiovascular Disease

## 2022-03-11 ENCOUNTER — Other Ambulatory Visit (HOSPITAL_COMMUNITY): Payer: Self-pay

## 2022-03-15 ENCOUNTER — Other Ambulatory Visit (HOSPITAL_COMMUNITY): Payer: Self-pay

## 2022-03-16 ENCOUNTER — Other Ambulatory Visit (HOSPITAL_COMMUNITY): Payer: Self-pay

## 2022-03-18 ENCOUNTER — Other Ambulatory Visit (HOSPITAL_COMMUNITY): Payer: Self-pay

## 2022-03-24 ENCOUNTER — Other Ambulatory Visit (HOSPITAL_COMMUNITY): Payer: Self-pay

## 2022-03-26 ENCOUNTER — Other Ambulatory Visit (HOSPITAL_COMMUNITY): Payer: Self-pay

## 2022-03-26 NOTE — Telephone Encounter (Signed)
Patient Advocate Encounter  Prior Authorization for Trulance '3MG'$  has been approved.   Effective: 03/25/2022 to 03/26/2023 Determination letter added to patient chart.  Clista Bernhardt, CPhT Rx Patient Advocate Phone: [phone]

## 2022-03-26 NOTE — Telephone Encounter (Signed)
Thank you so much for your help with this!  Patient has been advised that Trulance has now been approved and should be $25.00 copay. She verbalizes understanding and would like to express her appreciation to the PA team for getting the approval.

## 2022-06-01 ENCOUNTER — Other Ambulatory Visit: Payer: Self-pay | Admitting: Internal Medicine

## 2022-06-10 ENCOUNTER — Ambulatory Visit: Payer: 59 | Admitting: Orthopedic Surgery

## 2022-07-07 ENCOUNTER — Ambulatory Visit: Payer: 59 | Attending: Cardiovascular Disease | Admitting: Cardiovascular Disease

## 2022-07-07 ENCOUNTER — Encounter: Payer: Self-pay | Admitting: Cardiovascular Disease

## 2022-07-07 VITALS — BP 136/88 | HR 64 | Ht 64.0 in | Wt 188.4 lb

## 2022-07-07 DIAGNOSIS — R0789 Other chest pain: Secondary | ICD-10-CM | POA: Diagnosis not present

## 2022-07-07 DIAGNOSIS — I10 Essential (primary) hypertension: Secondary | ICD-10-CM | POA: Diagnosis not present

## 2022-07-07 DIAGNOSIS — R0609 Other forms of dyspnea: Secondary | ICD-10-CM

## 2022-07-07 NOTE — Progress Notes (Signed)
07/07/2022 Hannah Short   Jun 01, 1956  [id number]  Primary Physician Hannah Bal, PA-C Primary Cardiologist: Hannah Harp MD Hannah Short, [place]  HPI:  Hannah Short is a 66 y.o.   moderately overweight married Caucasian female mother of 5 children, grandmother of 2 grandchildren and great grandmother of 3 grandchildren who is referred by Hannah Bal, PA-C, her PCP, for evaluation of atypical chest pain and dyspnea.  I have taken care of her pastor Hannah Short, her mother Hannah Short and her brother Hannah Short.  She works as a [name] and takes care of her daughter  Hannah Short who has spina bifida.  I last saw her in the office 05/26/2021.  She has no cardiac risk factors.  She is never had a heart attack or stroke.  She has had a Nissen fundoplication 24 years ago for hiatal hernia.  She also has GERD and is on a PPI which somewhat improves her chest pain.  She is also noticed increasing dyspnea over the last month.   She had ave a normal 2D echo and a coronary calcium score of 0.  I did refer her to weight loss center and she is motivated to continue to lose weight.  She is lost 5 pounds and anticipates losing an additional 20.  She denies chest pain or shortness of breath.   Current Meds  Medication Sig   amLODipine (NORVASC) 2.5 MG tablet TAKE ONE TABLET BY MOUTH ONCE DAILY.   Chlorphen-Pseudoephed-APAP (CORICIDIN D PO) Take by mouth.   cholecalciferol (VITAMIN D3) 25 MCG (1000 UNIT) tablet Take 2 tablets (50 mcg total) by mouth daily.   cyclobenzaprine (FLEXERIL) 10 MG tablet Take 1 tablet (10 mg total) by mouth at bedtime as needed for muscle spasms.   dicyclomine (BENTYL) 10 MG capsule Take 10 mg by mouth as needed.   fexofenadine (ALLEGRA) 180 MG tablet Take 180 mg by mouth daily as needed.   omeprazole (PRILOSEC) 40 MG capsule Take 40 mg by mouth daily.   RESTASIS 0.05 % ophthalmic emulsion 1 drop 2 (two) times daily.   Simethicone 125 MG CAPS Take by mouth as  needed.   TRULANCE 3 MG TABS TAKE ONE TABLET BY MOUTH ONCE DAILY.     Allergies  Allergen Reactions   Topamax [Topiramate] Other (See Comments)    Pt.states makes legs go numb   Betanidine [Bethanidine]    Clonazepam     BALANCE ISSUE   Flagyl [Metronidazole]    Sulfa Antibiotics     Social History   Socioeconomic History   Marital status: Married    Spouse name: Hannah Short   Number of children: 5   Years of education: 12   Highest education level: Not on file  Occupational History   Occupation: CNA  Tobacco Use   Smoking status: Never   Smokeless tobacco: Never  Vaping Use   Vaping Use: Never used  Substance and Sexual Activity   Alcohol use: No   Drug use: No   Sexual activity: Not on file  Other Topics Concern   Not on file  Social History Narrative   Patient lives at home with her husband Hannah Short) and father in Sports coach and [place] her daughter.   Patient is taking care of her father full time and disabled daughter.   Education some college.    Right handed.   Caffeine one cup of coffee daily.   Pt states she is increasing her water intake.    Drinks  decaf tea.       Social Determinants of Health   Financial Resource Strain: Not on file  Food Insecurity: Not on file  Transportation Needs: Not on file  Physical Activity: Not on file  Stress: Not on file  Social Connections: Not on file  Intimate Partner Violence: Not on file     Review of Systems: General: negative for chills, fever, night sweats or weight changes.  Cardiovascular: negative for chest pain, dyspnea on exertion, edema, orthopnea, palpitations, paroxysmal nocturnal dyspnea or shortness of breath Dermatological: negative for rash Respiratory: negative for cough or wheezing Urologic: negative for hematuria Abdominal: negative for nausea, vomiting, diarrhea, bright red blood per rectum, melena, or hematemesis Neurologic: negative for visual changes, syncope, or dizziness All other systems reviewed  and are otherwise negative except as noted above.    Blood pressure 136/88, pulse 64, height '5\' 4"'$  (1.626 m), weight 188 lb 6.4 oz (85.5 kg), SpO2 99 %.  General appearance: alert and no distress Neck: no adenopathy, no carotid bruit, no JVD, supple, symmetrical, trachea midline, and thyroid not enlarged, symmetric, no tenderness/mass/nodules Lungs: clear to auscultation bilaterally Heart: regular rate and rhythm, S1, S2 normal, no murmur, click, rub or gallop Extremities: extremities normal, atraumatic, no cyanosis or edema Pulses: 2+ and symmetric Skin: Skin color, texture, turgor normal. No rashes or lesions Neurologic: Grossly normal  EKG sinus bradycardia 58 with nonspecific ST-T wave changes and left axis deviation.  She does have voltage criteria for LVH.  Personally reviewed this EKG.  ASSESSMENT AND PLAN:   Atypical chest pain History of atypical chest pain with a coronary calcium score of 0 measured 12/18/2019.  She no longer has chest pain.  Dyspnea on exertion History of dyspnea on exertion with 2D echo performed 12/18/2019 which was entirely normal except for grade 1 diastolic dysfunction.  She no longer complains of dyspnea.  She has lost some weight since I initially saw her intentionally.  Essential hypertension History of essential hypertension a blood pressure measured today at 136/88.  She is on amlodipine.     Hannah Harp MD FACP,FACC,FAHA, [facility] 07/07/2022 11:29 AM

## 2022-07-07 NOTE — Patient Instructions (Signed)
Medication Instructions:  Your physician recommends that you continue on your current medications as directed. Please refer to the Current Medication list given to you today.  *If you need a refill on your cardiac medications before your next appointment, please call your pharmacy*   Follow-Up: At [facility], you and your health needs are our priority.  As part of our continuing mission to provide you with exceptional heart care, we have created designated Provider Care Teams.  These Care Teams include your primary Cardiologist (physician) and Advanced Practice Providers (APPs -  Physician Assistants and Nurse Practitioners) who all work together to provide you with the care you need, when you need it.  We recommend signing up for the patient portal called "MyChart".  Sign up information is provided on this After Visit Summary.  MyChart is used to connect with patients for Virtual Visits (Telemedicine).  Patients are able to view lab/test results, encounter notes, upcoming appointments, etc.  Non-urgent messages can be sent to your provider as well.   To learn more about what you can do with MyChart, go to [email or web address].    Your next appointment:   We will see you on an as needed basis.  Provider:   Jonathan Berry, MD  

## 2022-07-07 NOTE — Assessment & Plan Note (Signed)
History of atypical chest pain with a coronary calcium score of 0 measured 12/18/2019.  She no longer has chest pain.

## 2022-07-07 NOTE — Assessment & Plan Note (Signed)
History of essential hypertension a blood pressure measured today at 136/88.  She is on amlodipine.

## 2022-07-07 NOTE — Assessment & Plan Note (Signed)
History of dyspnea on exertion with 2D echo performed 12/18/2019 which was entirely normal except for grade 1 diastolic dysfunction.  She no longer complains of dyspnea.  She has lost some weight since I initially saw her intentionally.

## 2022-07-19 ENCOUNTER — Ambulatory Visit: Payer: 59 | Admitting: Orthopedic Surgery

## 2022-08-31 ENCOUNTER — Other Ambulatory Visit: Payer: Self-pay | Admitting: Cardiovascular Disease

## 2022-11-08 ENCOUNTER — Encounter: Payer: Self-pay | Admitting: *Deleted

## 2022-11-09 ENCOUNTER — Encounter: Payer: Self-pay | Admitting: Internal Medicine

## 2022-11-09 ENCOUNTER — Ambulatory Visit (INDEPENDENT_AMBULATORY_CARE_PROVIDER_SITE_OTHER): Payer: 59 | Admitting: Internal Medicine

## 2022-11-09 VITALS — BP 126/72 | HR 84 | Ht 64.0 in | Wt 189.0 lb

## 2022-11-09 DIAGNOSIS — K219 Gastro-esophageal reflux disease without esophagitis: Secondary | ICD-10-CM

## 2022-11-09 DIAGNOSIS — R109 Unspecified abdominal pain: Secondary | ICD-10-CM | POA: Diagnosis not present

## 2022-11-09 DIAGNOSIS — R933 Abnormal findings on diagnostic imaging of other parts of digestive tract: Secondary | ICD-10-CM

## 2022-11-09 DIAGNOSIS — K449 Diaphragmatic hernia without obstruction or gangrene: Secondary | ICD-10-CM

## 2022-11-09 DIAGNOSIS — R140 Abdominal distension (gaseous): Secondary | ICD-10-CM | POA: Diagnosis not present

## 2022-11-09 DIAGNOSIS — K625 Hemorrhage of anus and rectum: Secondary | ICD-10-CM

## 2022-11-09 DIAGNOSIS — K649 Unspecified hemorrhoids: Secondary | ICD-10-CM | POA: Diagnosis not present

## 2022-11-09 MED ORDER — NA SULFATE-K SULFATE-MG SULF 17.5-3.13-1.6 GM/177ML PO SOLN: 1.0000 | Freq: Once | ORAL | 0 refills | Status: AC

## 2022-11-09 MED ORDER — DICYCLOMINE HCL 10 MG PO CAPS: 10.0000 mg | ORAL_CAPSULE | Freq: Three times a day (TID) | ORAL | 1 refills | Status: DC

## 2022-11-09 NOTE — Patient Instructions (Addendum)
You have been scheduled for a colonoscopy. Please follow written instructions given to you at your visit today.  Please pick up your prep supplies at the pharmacy within the next 1-3 days. If you use inhalers (even only as needed), please bring them with you on the day of your procedure.  Start taking your Pepcid in the evening  We have sent the following medications to your pharmacy for you to pick up at your convenience: Bentyl: take as needed  _______________________________________________________  If your blood pressure at your visit was 140/90 or greater, please contact your primary care physician to follow up on this.  _______________________________________________________  If you are age 14 or older, your body mass index should be between 23-30. Your Body mass index is 32.44 kg/m. If this is out of the aforementioned range listed, please consider follow up with your Primary Care Provider.  If you are age 62 or younger, your body mass index should be between 19-25. Your Body mass index is 32.44 kg/m. If this is out of the aformentioned range listed, please consider follow up with your Primary Care Provider.   ________________________________________________________  The [facility] providers would like to encourage you to use [facility] to communicate with providers for non-urgent requests or questions.  Due to long hold times on the telephone, sending your provider a message by [facility] may be a faster and more efficient way to get a response.  Please allow 48 business hours for a response.  Please remember that this is for non-urgent requests.  _______________________________________________________ It was a pleasure to see you today!  Thank you for trusting me with your gastrointestinal care!

## 2022-11-09 NOTE — Progress Notes (Signed)
Subjective:    Patient ID: Hannah Short, female    DOB: 06-24-1956, 67 y.o.   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  She is here alone today.  I last saw her in June 2023 for upper and lower endoscopy EGD on 01/13/2022 normal with the exception of a 2 cm hiatal hernia. Colonoscopy on the same day 3 polyps ranging from 3 to 8 mm in size.  Adenomas.  Medium sized lipoma in the ascending colon.  Diverticulosis in the sigmoid and descending.  She reports that she has developed left-sided abdominal pain and a pulling sensation.  Borborygmi.  2 days of red blood per rectum which was rather painless.  She was given hydrocortisone suppositories by primary care.  Took 2 days of Augmentin for "infection versus diverticulitis" but then was seen in the emergency department on 11/04/2022.  She had a CT scan which showed possible descending colitis.  Her left-sided abdominal pain has continued.  She has not had fever.  Stools are mostly formed of late but previously loose.  She uses IBgard and simethicone with benefit.  She is taking omeprazole and famotidine both 40 mg and both in the morning.  She is not having much heartburn.   Review of Systems As per HPI, otherwise negative  Current Medications, Allergies, Past Medical History, Past Surgical History, Family History and Social History were reviewed in [employer] record.    Objective:   Physical Exam BP 126/72   Pulse 84   Ht 5\' 4"  (1.626 m)   Wt 189 lb (85.7 kg)   BMI 32.44 kg/m   Gen:  awake, alert, NAD HEENT: anicteric  CV: RRR, no mrg Pulm: CTA b/l Abd: soft, epigastric and left-sided tenderness of moderate intensity without rebound or guarding, nondistended, well-healed midline abdominal scar, +BS throughout Ext: no c/c/e Neuro: nonfocal  CT Abdomen Pelvis W IV Contrast  Result Date: 11/04/2022 CLINICAL DATA: Abdominal pain. Rectal bleeding. EXAM: CT ABDOMEN AND PELVIS WITH CONTRAST TECHNIQUE: Multidetector CT imaging of the abdomen and pelvis was performed using the standard protocol following bolus administration of intravenous contrast. RADIATION DOSE REDUCTION: This exam was performed according to the departmental dose-optimization program which includes automated exposure control, adjustment of the mA and/or kV according to patient size and/or use of iterative reconstruction technique. CONTRAST: 80 cc Omnipaque 350 IV COMPARISON: CT 08/14/2020 FINDINGS: Lower chest: Stable 6 mm right lower lobe nodule, series 3, image 7, previously described is stable from 2019 and considered benign. No specific nodule follow-up is recommended. No acute basilar airspace disease. No pleural effusion. Hepatobiliary: Hepatic steatosis. There are multiple hepatic cysts, not significantly changed from prior exam. Multiple intraluminal gallstones. No pericholecystic fat stranding or inflammation. No biliary dilatation. Pancreas: No ductal dilatation or inflammation. Mild fatty atrophy. Spleen: Upper normal in size spanning 13 cm cranial caudal. No focal splenic abnormality. Adrenals/Urinary Tract: Normal adrenal glands. No hydronephrosis. No evidence of renal inflammation. Low-density left renal lesions are stable from prior exam and consistent with cysts, needing no further imaging follow-up. No renal stones. Decompressed ureters. Unremarkable urinary bladder. Stomach/Bowel: Small hiatal hernia. Surgical clips  adjacent to the gastroesophageal junction. Stomach is nondistended. No small bowel  obstruction or inflammation. The appendix is not confidently visualized, no evidence of appendicitis. Left colon is decompressed. Equivocal pericolonic edema about the proximal descending series 2, image 34. Vascular/Lymphatic: Mild aortic atherosclerosis. No aneurysm. Patent portal and splenic veins. No abdominopelvic adenopathy. Reproductive: No acute findings. Other: Prior upper abdominal ventral abdominal wall hernia repair. No ascites or focal fluid collection. Musculoskeletal: There are no acute or suspicious osseous abnormalities.   1. Equivocal pericolonic edema about the proximal descending colon versus nondistention. This can be seen with mild colitis. 2. Hepatic steatosis. Cholelithiasis without CT findings of acute cholecystitis. 3. Small hiatal hernia. Aortic Atherosclerosis (ICD10-I70.0). Electronically Signed By: Narda Rutherford [name] 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   Left-sided abdominal pain/abnormal CT of the descending colon/remote ischemic colitis/borborygmi and rectal bleeding --very likely she had another episode of ischemic colitis but she remains significantly tender on exam today.  She had a colonoscopy within the last year but with her ongoing pain recent bleeding and abnormal CT scan we will repeat colonoscopy at this time.  We reviewed the risk, benefits and alternatives and she is agreeable and wishes to proceed --Colonoscopy in the [facility] --Dicyclomine 20 mg 3 times daily as needed for crampy abdominal pain  2.  Abdominal  bloating/abdominal phrenic dyssynergia --her prior abdominal surgery exacerbates this condition. --IBgard which is very helpful to her as well as simethicone over-the-counter per box instruction --Query an element of SIBO; consider testing after resolution of #1 above  3.  GERD with small hiatal hernia --I have asked her to separate her famotidine from omeprazole --Omeprazole 40 mg in the morning --Change famotidine to 40 mg in the evening as needed  4.  Family history of colon cancer and history of adenomatous colon polyps --previous colonoscopy interval was recommended for 5 years which would have been summer 2028 however proceeding earlier as per #1  5.  Hemorrhoids--she brings pictures of what appears to be prolapsing hemorrhoids.  The bleeding could be related to left-sided colitis or internal hemorrhoids.  We will evaluate with colonoscopy further. --Possible candidate for hemorrhoidal banding in the future

## 2022-12-06 ENCOUNTER — Telehealth: Payer: Self-pay | Admitting: Internal Medicine

## 2022-12-06 NOTE — Telephone Encounter (Signed)
PT  is still having pain on LT side. She is concerned. Looking for options for relief. Please advise

## 2022-12-06 NOTE — Telephone Encounter (Signed)
Pt states she is still having pain on her left side. She is scheduled for colon on 5/31, she was wanting to know if we had anything available sooner. Discussed with pt that we do not but if we have a cancellation she would be notified.

## 2022-12-29 ENCOUNTER — Telehealth: Payer: Self-pay | Admitting: Internal Medicine

## 2022-12-29 NOTE — Telephone Encounter (Signed)
Patient reports she has not called the office at all and is scheduled to have a procedure with Dr. Rhea Belton. Apologized to patient.

## 2022-12-29 NOTE — Telephone Encounter (Signed)
PT is calling to get a refill on B12. It needs to be sent to [facility] on [address]

## 2022-12-31 ENCOUNTER — Encounter: Payer: Self-pay | Admitting: Internal Medicine

## 2022-12-31 ENCOUNTER — Ambulatory Visit (AMBULATORY_SURGERY_CENTER): Payer: 59 | Admitting: Internal Medicine

## 2022-12-31 VITALS — BP 124/70 | HR 76 | Temp 98.0°F | Resp 25 | Ht 64.0 in | Wt 189.0 lb

## 2022-12-31 DIAGNOSIS — K649 Unspecified hemorrhoids: Secondary | ICD-10-CM

## 2022-12-31 DIAGNOSIS — R109 Unspecified abdominal pain: Secondary | ICD-10-CM | POA: Diagnosis not present

## 2022-12-31 DIAGNOSIS — K625 Hemorrhage of anus and rectum: Secondary | ICD-10-CM

## 2022-12-31 MED ORDER — AMOXICILLIN-POT CLAVULANATE 875-125 MG PO TABS: 1.0000 | ORAL_TABLET | Freq: Two times a day (BID) | ORAL | 0 refills | Status: AC

## 2022-12-31 MED ORDER — SODIUM CHLORIDE 0.9 % IV SOLN
500.0000 mL | Freq: Once | INTRAVENOUS | Status: DC
Start: 2022-12-31 — End: 2022-12-31

## 2022-12-31 NOTE — Telephone Encounter (Signed)
Patient called stating her antibiotic that was suppose to called into [facility] in [place] has not been sent in yet. Please advise, thank you.

## 2022-12-31 NOTE — Progress Notes (Signed)
Dx  ADD

## 2022-12-31 NOTE — Progress Notes (Signed)
Vss nad trans to pacu 

## 2022-12-31 NOTE — Progress Notes (Signed)
GASTROENTEROLOGY PROCEDURE H&P NOTE   Primary Care Physician: Lianne Moris, PA-C    Reason for Procedure:  Left-sided abdominal pain, abnormal CT scan  Plan:    Colonoscopy  Patient is appropriate for endoscopic procedure(s) in the ambulatory ([facility]) setting.  The nature of the procedure, as well as the risks, benefits, and alternatives were carefully and thoroughly reviewed with the patient. Ample time for discussion and questions allowed. The patient understood, was satisfied, and agreed to proceed.     HPI: Hannah Short is a 67 y.o. female who presents for colonoscopy.  Medical history as below.  Tolerated the prep.  No recent chest pain or shortness of breath.  No abdominal pain today.  Past Medical History:  Diagnosis Date   Acute torn meniscus of knee, left, initial encounter 2019   Anemia    Anxiety    Arthritis    Back pain    Bulging lumbar disc    Chest pain    Chronically dry eyes, bilateral    Constipation    Depression    Diverticulosis    Fatty liver    Fibromyalgia    Gallstones    GERD (gastroesophageal reflux disease)    Heart murmur    mitral valve prolapse   Hepatic steatosis    Hiatal hernia    Hypertension    Hypokalemia    Internal hemorrhoids    Joint pain    Lactose intolerance    Lipoma of colon    Migraine    Mitral valve prolapse    Neck pain    L3 4 and 5 bulging discs   Shortness of breath    Tubular adenoma of colon     Past Surgical History:  Procedure Laterality Date   CESAREAN SECTION     HERNIA REPAIR     1997   TOE SURGERY  1990   stepped on toothpick    Prior to Admission medications   Medication Sig Start Date End Date Taking? Authorizing Provider  amLODipine (NORVASC) 2.5 MG tablet TAKE ONE TABLET BY MOUTH ONCE DAILY. 09/01/22  Yes Runell Gess, MD  busPIRone (BUSPAR) 5 MG tablet Take 5 mg by mouth 3 (three) times daily.   Yes [provider]  cholecalciferol (VITAMIN D3) 25 MCG (1000 UNIT)  tablet Take 2 tablets (50 mcg total) by mouth daily. 09/15/20  Yes Langston Reusing, MD  dicyclomine (BENTYL) 10 MG capsule Take 1 capsule (10 mg total) by mouth 4 (four) times daily -  before meals and at bedtime. 11/09/22  Yes Belinda Schlichting, Carie Caddy, MD  famotidine (PEPCID) 40 MG tablet Take 40 mg by mouth daily.   Yes [provider]  omeprazole (PRILOSEC) 40 MG capsule Take 40 mg by mouth daily.   Yes [provider]  RESTASIS 0.05 % ophthalmic emulsion 1 drop 2 (two) times daily. 08/06/20  Yes [provider]  Chlorphen-Pseudoephed-APAP (CORICIDIN D PO) Take by mouth.    [provider]  cyanocobalamin (VITAMIN B12) 250 MCG tablet Take 250 mcg by mouth daily.    [provider]  cyclobenzaprine (FLEXERIL) 10 MG tablet Take 1 tablet (10 mg total) by mouth at bedtime as needed for muscle spasms. 10/07/21   Juanda Chance, NP  fexofenadine (ALLEGRA) 180 MG tablet Take 180 mg by mouth daily as needed.    [provider]  Simethicone 125 MG CAPS Take by mouth as needed.    [provider]    Current  Outpatient Medications  Medication Sig Dispense Refill   amLODipine (NORVASC) 2.5 MG tablet TAKE ONE TABLET BY MOUTH ONCE DAILY. 90 tablet 3   busPIRone (BUSPAR) 5 MG tablet Take 5 mg by mouth 3 (three) times daily.     cholecalciferol (VITAMIN D3) 25 MCG (1000 UNIT) tablet Take 2 tablets (50 mcg total) by mouth daily.     dicyclomine (BENTYL) 10 MG capsule Take 1 capsule (10 mg total) by mouth 4 (four) times daily -  before meals and at bedtime. 30 capsule 1   famotidine (PEPCID) 40 MG tablet Take 40 mg by mouth daily.     omeprazole (PRILOSEC) 40 MG capsule Take 40 mg by mouth daily.     RESTASIS 0.05 % ophthalmic emulsion 1 drop 2 (two) times daily.     Chlorphen-Pseudoephed-APAP (CORICIDIN D PO) Take by mouth.     cyanocobalamin (VITAMIN B12) 250 MCG tablet Take 250 mcg by mouth daily.     cyclobenzaprine (FLEXERIL) 10 MG tablet Take 1  tablet (10 mg total) by mouth at bedtime as needed for muscle spasms. 30 tablet 0   fexofenadine (ALLEGRA) 180 MG tablet Take 180 mg by mouth daily as needed.     Simethicone 125 MG CAPS Take by mouth as needed.     Current Facility-Administered Medications  Medication Dose Route Frequency Provider Last Rate Last Admin   0.9 %  sodium chloride infusion  500 mL Intravenous Once Mort Smelser, Carie Caddy, MD        Allergies as of 12/31/2022 - Review Complete 12/31/2022  Allergen Reaction Noted   Topamax [topiramate] Other (See Comments) 02/16/2017   Betanidine [bethanidine]  12/19/2013   Clonazepam  04/29/2020   Flagyl [metronidazole]  12/19/2013   Sulfa antibiotics  12/19/2013    Family History  Problem Relation Age of Onset   Heart failure Mother    Heart failure Father    Colon cancer Father    Hypertension Father    Cancer Father    Stomach cancer Neg Hx    Esophageal cancer Neg Hx     Social History   Socioeconomic History   Marital status: Married    Spouse name: [name]   Number of children: 5   Years of education: 12   Highest education level: Not on file  Occupational History   Occupation: CNA  Tobacco Use   Smoking status: Never   Smokeless tobacco: Never  Vaping Use   Vaping Use: Never used  Substance and Sexual Activity   Alcohol use: No   Drug use: No   Sexual activity: Not on file  Other Topics Concern   Not on file  Social History Narrative   Patient lives at home with her husband Randa Spike) and father in Social worker and [place] her daughter.   Patient is taking care of her father full time and disabled daughter.   Education some college.    Right handed.   Caffeine one cup of coffee daily.   Pt states she is increasing her water intake.    Drinks decaf tea.       Social Determinants of Health   Financial Resource Strain: Not on file  Food Insecurity: Not on file  Transportation Needs: Not on file  Physical Activity: Not on file  Stress: Not on file  Social  Connections: Not on file  Intimate Partner Violence: Not on file    Physical Exam: Vital signs in last 24 hours: @BP  (!) 134/98   Pulse 85  Temp 98 F (36.7 C)   Ht 5\' 4"  (1.626 m)   Wt 189 lb (85.7 kg)   SpO2 96%   BMI 32.44 kg/m  GEN: NAD EYE: Sclerae anicteric ENT: MMM CV: Non-tachycardic Pulm: CTA b/l GI: Soft, NT/ND NEURO:  Alert & Oriented x 3   Erick Blinks, MD [facility]  12/31/2022 2:28 PM

## 2022-12-31 NOTE — Addendum Note (Signed)
Addended by: Illene Bolus on: 12/31/2022 05:05 PM   Modules accepted: Orders

## 2022-12-31 NOTE — Op Note (Signed)
[facility] Patient Name: Hannah Short Procedure Date: 12/31/2022 2:23 PM MRN: [id number] Endoscopist: Beverley Fiedler , MD, [id number] Age: 67 Referring MD:  Date of Birth: April 06, 1956 Gender: Female Account #: [id number] Procedure:                Colonoscopy Indications:              Abdominal pain in the left upper quadrant, Rectal                            bleeding, Abnormal CT of the GI tract, personal hx                            of adenoma and fam hx of colon cancer, last exam                            June 2023 Medicines:                Monitored Anesthesia Care Procedure:                Pre-Anesthesia Assessment:                           - Prior to the procedure, a History and Physical                            was performed, and patient medications and                            allergies were reviewed. The patient's tolerance of                            previous anesthesia was also reviewed. The risks                            and benefits of the procedure and the sedation                            options and risks were discussed with the patient.                            All questions were answered, and informed consent                            was obtained. Prior Anticoagulants: The patient has                            taken no anticoagulant or antiplatelet agents. ASA                            Grade Assessment: II - A patient with mild systemic                            disease. After reviewing the risks and benefits,  the patient was deemed in satisfactory condition to                            undergo the procedure.                           After obtaining informed consent, the colonoscope                            was passed under direct vision. Throughout the                            procedure, the patient's blood pressure, pulse, and                            oxygen saturations were monitored continuously. The                             CF [id number] #[id number] was introduced through the anus                            and advanced to the cecum, identified by                            appendiceal orifice and ileocecal valve. The                            colonoscopy was performed without difficulty. The                            patient tolerated the procedure well. The quality                            of the bowel preparation was excellent. The                            ileocecal valve, appendiceal orifice, and rectum                            were photographed. Scope In: 2:35:24 PM Scope Out: 2:47:19 PM Scope Withdrawal Time: 0 hours 7 minutes 13 seconds  Total Procedure Duration: 0 hours 11 minutes 55 seconds  Findings:                 Hemorrhoids were found on perianal exam.                           A few small-mouthed diverticula were found in the                            sigmoid colon.                           External and internal hemorrhoids were found during  retroflexion and during digital exam. The                            hemorrhoids were medium-sized.                           The exam was otherwise without abnormality. No                            evidence of inflammation or colitis. Complications:            No immediate complications. Estimated Blood Loss:     Estimated blood loss: none. Impression:               - Mild diverticulosis in the sigmoid colon.                           - External and internal hemorrhoids. Source of red                            blood per rectum.                           - The examination was otherwise normal.                           - Presumed, resolved episode of recurrent ischemic                            colitis.                           - No specimens collected. Recommendation:           - Patient has a contact number available for                            emergencies. The signs and symptoms of  potential                            delayed complications were discussed with the                            patient. Return to normal activities tomorrow.                            Written discharge instructions were provided to the                            patient.                           - Resume previous diet.                           - Continue present medications.                           -  Await pathology results.                           - Repeat colonoscopy in 5 years for                            screening/surveillance purposes. Beverley Fiedler, MD 12/31/2022 2:59:22 PM This report has been signed electronically.

## 2022-12-31 NOTE — Patient Instructions (Addendum)
Handouts on hemorrhoids and diverticulosis to patient Await pathology results Resume previous diet and continue present medications Repeat colonoscopy for surveillance in 5 years    YOU HAD AN ENDOSCOPIC PROCEDURE TODAY AT THE [facility]:   Refer to the procedure report that was given to you for any specific questions about what was found during the examination.  If the procedure report does not answer your questions, please call your gastroenterologist to clarify.  If you requested that your care partner not be given the details of your procedure findings, then the procedure report has been included in a sealed envelope for you to review at your convenience later.  YOU SHOULD EXPECT: Some feelings of bloating in the abdomen. Passage of more gas than usual.  Walking can help get rid of the air that was put into your GI tract during the procedure and reduce the bloating. If you had a lower endoscopy (such as a colonoscopy or flexible sigmoidoscopy) you may notice spotting of blood in your stool or on the toilet paper. If you underwent a bowel prep for your procedure, you may not have a normal bowel movement for a few days.  Please Note:  You might notice some irritation and congestion in your nose or some drainage.  This is from the oxygen used during your procedure.  There is no need for concern and it should clear up in a day or so.  SYMPTOMS TO REPORT IMMEDIATELY:  Following lower endoscopy (colonoscopy or flexible sigmoidoscopy):  Excessive amounts of blood in the stool  Significant tenderness or worsening of abdominal pains  Swelling of the abdomen that is new, acute  Fever of 100F or higher  For urgent or emergent issues, a gastroenterologist can be reached at any hour by calling [phone]. Do not use MyChart messaging for urgent concerns.    DIET:  We do recommend a small meal at first, but then you may proceed to your regular diet.  Drink plenty of fluids but you  should avoid alcoholic beverages for 24 hours.  ACTIVITY:  You should plan to take it easy for the rest of today and you should NOT DRIVE or use heavy machinery until tomorrow (because of the sedation medicines used during the test).    FOLLOW UP: Our staff will call the number listed on your records the next business day following your procedure.  We will call around 7:15- 8:00 am to check on you and address any questions or concerns that you may have regarding the information given to you following your procedure. If we do not reach you, we will leave a message.     If any biopsies were taken you will be contacted by phone or by letter within the next 1-3 weeks.  Please call us at [phone] if you have not heard about the biopsies in 3 weeks.    SIGNATURES/CONFIDENTIALITY: You and/or your care partner have signed paperwork which will be entered into your electronic medical record.  These signatures attest to the fact that that the information above on your After Visit Summary has been reviewed and is understood.  Full responsibility of the confidentiality of this discharge information lies with you and/or your care-partner.

## 2022-12-31 NOTE — Telephone Encounter (Signed)
Per Dr. Rhea Belton, patient reports at her colonoscopy today that she is still having abdominal pain and bloating. Dr. Rhea Belton wants patient treated for SIBO (small intestine bacterial overgrowth) and to send in a prescription for Augmentin 875 mg one tablet by mouth twice daily x 1 week. Informed patient of Dr. Lauro Franklin recommendations and prescription has been sent to the pharmacy. Informed patient to also call the office if her symptoms persist despite the treatment. Patient verbalized understanding.

## 2023-01-03 ENCOUNTER — Telehealth: Payer: Self-pay | Admitting: *Deleted

## 2023-01-03 NOTE — Telephone Encounter (Signed)
  Follow up Call-     12/31/2022    1:54 PM 01/13/2022    2:28 PM  Call back number  Post procedure Call Back phone  # [phone]  Permission to leave phone message Yes Yes     Patient questions:  Do you have a fever, pain , or abdominal swelling? No. Pain Score  0 *  Have you tolerated food without any problems? Yes.    Have you been able to return to your normal activities? Yes.    Do you have any questions about your discharge instructions: Diet   No. Medications  No. Follow up visit  No.  Do you have questions or concerns about your Care? No.  Actions: * If pain score is 4 or above: No action needed, pain <4.

## 2023-02-10 ENCOUNTER — Other Ambulatory Visit (HOSPITAL_COMMUNITY): Payer: Self-pay

## 2023-04-26 ENCOUNTER — Ambulatory Visit: Payer: PRIVATE HEALTH INSURANCE | Admitting: Gastroenterology

## 2023-08-12 ENCOUNTER — Ambulatory Visit: Payer: PRIVATE HEALTH INSURANCE | Admitting: Internal Medicine

## 2023-09-16 ENCOUNTER — Other Ambulatory Visit: Payer: Self-pay

## 2023-09-16 MED ORDER — AMLODIPINE BESYLATE 2.5 MG PO TABS: 2.5000 mg | ORAL_TABLET | Freq: Every day | ORAL | 0 refills | Status: DC

## 2023-10-28 ENCOUNTER — Other Ambulatory Visit: Payer: Self-pay

## 2023-10-28 MED ORDER — AMLODIPINE BESYLATE 2.5 MG PO TABS: 2.5000 mg | ORAL_TABLET | Freq: Every day | ORAL | 0 refills | Status: DC

## 2023-11-11 ENCOUNTER — Other Ambulatory Visit: Payer: Self-pay | Admitting: Cardiovascular Disease

## 2023-11-25 ENCOUNTER — Other Ambulatory Visit: Payer: Self-pay | Admitting: Cardiovascular Disease

## 2024-04-25 ENCOUNTER — Other Ambulatory Visit: Payer: Self-pay

## 2024-04-25 ENCOUNTER — Encounter: Payer: Self-pay | Admitting: Allergy & Immunology

## 2024-04-25 ENCOUNTER — Ambulatory Visit (INDEPENDENT_AMBULATORY_CARE_PROVIDER_SITE_OTHER): Payer: Self-pay | Admitting: Allergy & Immunology

## 2024-04-25 VITALS — BP 130/80 | HR 85 | Temp 98.3°F | Resp 18 | Ht 62.99 in | Wt 191.0 lb

## 2024-04-25 DIAGNOSIS — R21 Rash and other nonspecific skin eruption: Secondary | ICD-10-CM | POA: Diagnosis not present

## 2024-04-25 DIAGNOSIS — J4520 Mild intermittent asthma, uncomplicated: Secondary | ICD-10-CM | POA: Diagnosis not present

## 2024-04-25 MED ORDER — BUDESONIDE 0.5 MG/2ML IN SUSP: RESPIRATORY_TRACT | 1 refills | Status: DC

## 2024-04-25 MED ORDER — CLOBETASOL PROPIONATE 0.05 % EX OINT: 1.0000 | TOPICAL_OINTMENT | Freq: Two times a day (BID) | CUTANEOUS | 3 refills | Status: DC

## 2024-04-25 NOTE — Patient Instructions (Addendum)
 1. Rash  We are going to get some labs to rule out weird causes of rashes. - I am not sure what this is. - We are going to get the outside records to see what the biopsy showed. - Start a higher potency steroid: clobetasol  twice daily for 14 days. - We may consider something like Dupixent in the future. - Consider patch testing to test for chemical sensitivities.  - These are placed in [place] on Mondays and then can be read on a Wednesday and Friday in [place].   2. Mild intermittent asthma, uncomplicated - Lung testing looked subpar and did not change with the albuterol treatment.  - You have a nebulizer which is great. - Let's add on budesonide  (inhaled steroid) twice daily for two weeks. - Daily controller medication(s): NOTHING - Prior to physical activity: albuterol 2 puffs 10-15 minutes before physical activity. - Rescue medications: albuterol 4 puffs every 4-6 hours as needed and albuterol nebulizer one vial every 4-6 hours as needed - Changes during respiratory infections or worsening symptoms: Add on Pulmicort  (budesonide ) 0.5mg  one treatment three times daily for ONE TO TWO WEEKS. - Asthma control goals:  * Full participation in all desired activities (may need albuterol before activity) * Albuterol use two time or less a week on average (not counting use with activity) * Cough interfering with sleep two time or less a month * Oral steroids no more than once a year * No hospitalizations  3. Return in about 4 weeks (around 05/23/2024). You can have the follow up appointment with Dr. Iva or a Nurse Practicioner (our Nurse Practitioners are excellent and always have Physician oversight!).    Please inform us  of any Emergency Department visits, hospitalizations, or changes in symptoms. Call us  before going to the ED for breathing or allergy symptoms since we might be able to fit you in for a sick visit. Feel free to contact us  anytime with any questions, problems, or  concerns.  It was a pleasure to meet you again today!  Websites that have reliable patient information: 1. American Academy of Asthma, Allergy, and Immunology: www.aaaai.org 2. Food Allergy Research and Education (FARE): foodallergy.org 3. Mothers of Asthmatics: http://www.asthmacommunitynetwork.org 4. American College of Allergy, Asthma, and Immunology: www.acaai.org      "Like" us  on Facebook and Instagram for our latest updates!      A healthy democracy works best when [employer] participate! Make sure you are registered to vote! If you have moved or changed any of your contact information, you will need to get this updated before voting! Scan the QR codes below to learn more!

## 2024-04-25 NOTE — Progress Notes (Unsigned)
 NEW PATIENT  Date of Service/Encounter:  04/25/24  Consult requested by: Dow Longs, PA-C   Assessment:   Rash (present since May 2025) - sees Dermatologist, but only minimally responsive to topical steroids as well as prednisone   Mild intermittent asthma, uncomplicated  Recent pneumonia  Plan/Recommendations:   1. Rash  We are going to get some labs to rule out weird causes of rashes. - I am not sure what this is. - We are going to get the outside records to see what the biopsy showed. - Start a higher potency steroid: clobetasol  twice daily for 14 days. - We may consider something like Dupixent in the future. - Consider patch testing to test for chemical sensitivities.  - These are placed in [place] on Mondays and then can be read on a Wednesday and Friday in [place].   2. Mild intermittent asthma, uncomplicated - Lung testing looked subpar and did not change with the albuterol treatment.  - You have a nebulizer which is great. - Let's add on budesonide  (inhaled steroid) twice daily for two weeks. - Daily controller medication(s): NOTHING - Prior to physical activity: albuterol 2 puffs 10-15 minutes before physical activity. - Rescue medications: albuterol 4 puffs every 4-6 hours as needed and albuterol nebulizer one vial every 4-6 hours as needed - Changes during respiratory infections or worsening symptoms: Add on Pulmicort  (budesonide ) 0.5mg  one treatment three times daily for ONE TO TWO WEEKS. - Asthma control goals:  * Full participation in all desired activities (may need albuterol before activity) * Albuterol use two time or less a week on average (not counting use with activity) * Cough interfering with sleep two time or less a month * Oral steroids no more than once a year * No hospitalizations  3. Return in about 4 weeks (around 05/23/2024). You can have the follow up appointment with Dr. Iva or a Nurse Practicioner (our Nurse Practitioners are  excellent and always have Physician oversight!).    This note in its entirety was forwarded to the Provider who requested this consultation.  Subjective:   Hannah Short is a 68 y.o. female presenting today for evaluation of  Chief Complaint  Patient presents with   Rash    Started in may and gradually travel up her arm. Nothing she has taken has worked.    Hannah Short has a history of the following: Patient Active Problem List   Diagnosis Date Noted   Essential hypertension 03/04/2020   Morbid obesity (HCC) 03/04/2020   Dyspnea on exertion 11/28/2019   Abdominal pain 11/19/2016   Change in bowel habits 11/19/2016   Atypical chest pain 11/19/2016   Bloating 11/19/2016   Migraine    Fibromyalgia     History obtained from: chart review and patient and her daughter.   Discussed the use of AI scribe software for clinical note transcription with the patient and/or guardian, who gave verbal consent to proceed.  [name] was referred by Dow Longs, PA-C.     Hannah Short is a 68 y.o. female presenting for an evaluation of a rash.   Asthma/Respiratory Symptom History: In July, she developed pneumonia and was prescribed an albuterol inhaler. She has difficulty using the inhaler due to it dispersing in her mouth, so she carries a nebulizer for albuterol use, which she uses sparingly due to jitteriness. She experiences tightness in her chest and back, but no nighttime coughing. She has had pneumonia three times in her life, with the most recent  occurrence in July.  Skin Symptom History: She has been experiencing a persistent rash since May. Initially treated with triamcinolone, the rash did not resolve. A dermatologist prescribed Zura beef, but due to the high copay, she opted for samples, which were ineffective. 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 The rash has spread to various parts of her body and has not resolved since its onset.  GERD Symptom History: She has a history of IBS, which causes significant abdominal bloating and discomfort. She manages symptoms with Avagard and methadone, which help but result in diarrhea. She has not tried a gluten-free diet despite suggestions from family that gluten might be a trigger.  Her medication regimen includes occasional use of Allegra and Xyzal for allergies. She has not been on daily asthma medication and questions whether she has asthma, noting that her breathing issues worsened post-pneumonia. Family history includes a sister with celiac disease and gluten allergy.   Otherwise, there is no history of other atopic diseases, including drug allergies, stinging insect allergies, or contact dermatitis. There is no significant infectious history. Vaccinations are up to date.    Past Medical History: Patient Active Problem List   Diagnosis Date Noted   Essential hypertension 03/04/2020   Morbid obesity (HCC) 03/04/2020   Dyspnea on exertion 11/28/2019   Abdominal pain 11/19/2016   Change in bowel habits 11/19/2016   Atypical chest pain 11/19/2016   Bloating 11/19/2016   Migraine    Fibromyalgia     Medication List:  Allergies as of 04/25/2024       Reactions   Topamax [topiramate] Other (See Comments)   Pt.states makes legs go numb   Betanidine [bethanidine]    Clonazepam    BALANCE ISSUE   Flagyl [metronidazole]    Sulfa Antibiotics         Medication List        Accurate as of April 25, 2024 11:59 PM. If you have any questions, ask your nurse or doctor.          amLODipine  2.5 MG tablet Commonly known as: NORVASC  TAKE ONE TABLET BY MOUTH ONCE DAILY   budesonide  0.5 MG/2ML nebulizer solution Commonly known as: Pulmicort  Use one vial 3 times daily as needed during flares. Started by: Marty Morton Shaggy   busPIRone 5 MG tablet Commonly known as:  BUSPAR Take 5 mg by mouth 3 (three) times daily.   cholecalciferol 25 MCG (1000 UNIT) tablet Commonly known as: VITAMIN D3 Take 2 tablets (50 mcg total) by mouth daily.   clobetasol  ointment 0.05 % Commonly known as: TEMOVATE  Apply 1 Application topically 2 (two) times daily. Use for two weeks at a time top. Started by: Marty Morton Shaggy   CORICIDIN D PO Take by mouth.   cyanocobalamin  250 MCG tablet Commonly known as: VITAMIN B12 Take 250 mcg by mouth daily.   cyclobenzaprine  10 MG tablet Commonly known as: FLEXERIL  Take 1 tablet (10 mg total) by mouth at bedtime as needed for muscle spasms.   dicyclomine  10 MG capsule Commonly known as: BENTYL  Take 1 capsule (10 mg total) by mouth 4 (four) times daily -  before meals and at bedtime.   famotidine 40 MG tablet Commonly known as: PEPCID Take 40 mg by mouth daily.   fexofenadine 180 MG tablet Commonly known as: ALLEGRA Take 180 mg by mouth daily as needed.   omeprazole 40 MG capsule Commonly  known as: PRILOSEC Take 40 mg by mouth daily.   Restasis 0.05 % ophthalmic emulsion Generic drug: cycloSPORINE 1 drop 2 (two) times daily.   Simethicone 125 MG Caps Take by mouth as needed.        Birth History: non-contributory  Developmental History: non-contributory  Past Surgical History: Past Surgical History:  Procedure Laterality Date   ADENOIDECTOMY Bilateral 1998   CESAREAN SECTION     HERNIA REPAIR     1997   TOE SURGERY  1990   stepped on toothpick     Family History: Family History  Problem Relation Age of Onset   Heart failure Mother    Heart failure Father    Colon cancer Father    Hypertension Father    Cancer Father    Stomach cancer Neg Hx    Esophageal cancer Neg Hx      Social History: Nell lives at home with her daughter.  She lives in a house that is 62 years old.  There is hardwood throughout the home.  She has gas heating and central cooling.  There is 1 dog and 3 cats  inside of the home.  There are no dust mite covers on the bedding.  There is no tobacco exposure.  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  There is no fume, chemical, or dust exposure.  They do live near [address].   Review of systems otherwise negative other than that mentioned in the HPI.    Objective:   Blood pressure 130/80, pulse 85, temperature 98.3 F (36.8 C), temperature source Temporal, resp. rate 18, height 5' 2.99 (1.6 m), weight 191 lb (86.6 kg), SpO2 97%. Body mass index is 33.84 kg/m.     Physical Exam Vitals reviewed.  Constitutional:      Appearance: She is well-developed.  HENT:     Head: Normocephalic and atraumatic.     Right Ear: Tympanic membrane, ear canal and external ear normal. No drainage, swelling or tenderness. Tympanic membrane is not injected, scarred, erythematous, retracted or bulging.     Left Ear: Tympanic membrane, ear canal and external ear normal. No drainage, swelling or tenderness. Tympanic membrane is not injected, scarred, erythematous, retracted or bulging.     Nose: No nasal deformity, septal deviation, mucosal edema or rhinorrhea.     Right Turbinates: Enlarged, swollen and pale.     Left Turbinates: Enlarged, swollen and pale.     Right Sinus: No maxillary sinus tenderness or frontal sinus tenderness.     Left Sinus: No maxillary sinus tenderness or frontal sinus tenderness.     Mouth/Throat:     Mouth: Mucous membranes are not pale and not dry.     Pharynx: Uvula midline.  Eyes:     General:        Right eye: No discharge.        Left eye: No discharge.     Conjunctiva/sclera: Conjunctivae normal.     Right eye: Right conjunctiva is not injected. No chemosis.    Left eye: Left conjunctiva is not injected. No chemosis.    Pupils: Pupils are equal, round, and reactive to light.  Cardiovascular:     Rate and Rhythm: Normal rate and regular rhythm.     Heart sounds: Normal heart sounds.  Pulmonary:     Effort:  Pulmonary effort is normal. No tachypnea, accessory muscle usage or respiratory distress.     Breath sounds: Normal breath sounds. No wheezing, rhonchi  or rales.  Chest:     Chest wall: No tenderness.  Abdominal:     Tenderness: There is no abdominal tenderness. There is no guarding or rebound.  Lymphadenopathy:     Head:     Right side of head: No submandibular, tonsillar or occipital adenopathy.     Left side of head: No submandibular, tonsillar or occipital adenopathy.     Cervical: No cervical adenopathy.  Skin:    General: Skin is warm.     Capillary Refill: Capillary refill takes less than 2 seconds.     Coloration: Skin is not pale.     Findings: Rash present. No abrasion, erythema or petechiae. Rash is papular. Rash is not urticarial or vesicular.     Comments: Erythematous papular rash over the bilateal arms.   Neurological:     Mental Status: She is alert.  Psychiatric:        Behavior: Behavior is cooperative.      Diagnostic studies:    Spirometry: results abnormal (FEV1: 1.32/59%, FVC: 1.96/69%, FEV1/FVC: 67%).    Spirometry consistent with possible restrictive disease. DuoNeb nebulizer treatment given with minimal improvement.   Allergy Studies: deferred due to insurance stipulations that require a separate visit for testing          Marty Shaggy, MD [facility] 

## 2024-04-26 ENCOUNTER — Encounter: Payer: Self-pay | Admitting: Allergy & Immunology

## 2024-04-29 LAB — CBC WITH DIFF/PLATELET
Basophils Absolute: 0.1 x10E3/uL (ref 0.0–0.2)
Basos: 1 %
EOS (ABSOLUTE): 0.2 x10E3/uL (ref 0.0–0.4)
Eos: 2 %
Hematocrit: 42.8 % (ref 34.0–46.6)
Hemoglobin: 14 g/dL (ref 11.1–15.9)
Immature Grans (Abs): 0 x10E3/uL (ref 0.0–0.1)
Immature Granulocytes: 0 %
Lymphocytes Absolute: 2.7 x10E3/uL (ref 0.7–3.1)
Lymphs: 39 %
MCH: 30.6 pg (ref 26.6–33.0)
MCHC: 32.7 g/dL (ref 31.5–35.7)
MCV: 93 fL (ref 79–97)
Monocytes Absolute: 0.7 x10E3/uL (ref 0.1–0.9)
Monocytes: 11 %
Neutrophils Absolute: 3.2 x10E3/uL (ref 1.4–7.0)
Neutrophils: 47 %
Platelets: 336 x10E3/uL (ref 150–450)
RBC: 4.58 x10E6/uL (ref 3.77–5.28)
RDW: 14.4 % (ref 11.7–15.4)
WBC: 6.9 x10E3/uL (ref 3.4–10.8)

## 2024-04-29 LAB — CELIAC DISEASE AB SCREEN W/RFX
Antigliadin Abs, IgA: 6 U (ref 0–19)
IgA/Immunoglobulin A, Serum: 397 mg/dL — ABNORMAL HIGH (ref 87–352)
Transglutaminase IgA: <2 U/mL (ref 0–3)

## 2024-04-29 LAB — TRYPTASE: Tryptase: 7.1 ug/L (ref 2.2–13.2)

## 2024-04-29 LAB — CMP14+EGFR
ALT: 15 IU/L (ref 0–32)
AST: 16 IU/L (ref 0–40)
Albumin: 4.7 g/dL (ref 3.9–4.9)
Alkaline Phosphatase: 98 IU/L (ref 49–135)
BUN/Creatinine Ratio: 18 (ref 12–28)
BUN: 20 mg/dL (ref 8–27)
Bilirubin Total: 0.4 mg/dL (ref 0.0–1.2)
CO2: 25 mmol/L (ref 20–29)
Calcium: 9.6 mg/dL (ref 8.7–10.3)
Chloride: 101 mmol/L (ref 96–106)
Creatinine, Ser: 1.09 mg/dL — ABNORMAL HIGH (ref 0.57–1.00)
Globulin, Total: 3.1 g/dL (ref 1.5–4.5)
Glucose: 86 mg/dL (ref 70–99)
Potassium: 3.2 mmol/L — ABNORMAL LOW (ref 3.5–5.2)
Sodium: 143 mmol/L (ref 134–144)
Total Protein: 7.8 g/dL (ref 6.0–8.5)
eGFR: 56 mL/min/1.73 — ABNORMAL LOW (ref >59)

## 2024-04-29 LAB — ANTINUCLEAR ANTIBODIES, IFA: ANA Titer 1: POSITIVE — AB

## 2024-04-29 LAB — ALPHA-GAL PANEL
Allergen Lamb IgE: <0.1 kU/L
Beef IgE: <0.1 kU/L
IgE (Immunoglobulin E), Serum: 10 [IU]/mL (ref 6–495)
O215-IgE Alpha-Gal: <0.1 kU/L
Pork IgE: <0.1 kU/L

## 2024-04-29 LAB — C-REACTIVE PROTEIN: CRP: 2 mg/L (ref 0–10)

## 2024-04-29 LAB — FANA STAINING PATTERNS: Speckled Pattern: 1:1280 {titer} — ABNORMAL HIGH

## 2024-04-29 LAB — ALLERGEN, RYE, F5: Rye IgE: <0.1 kU/L

## 2024-04-29 LAB — SEDIMENTATION RATE: Sed Rate: 16 mm/h (ref 0–40)

## 2024-04-29 LAB — ALLERGEN BARLEY F6: Allergen Barley IgE: <0.1 kU/L

## 2024-04-29 LAB — THYROID ANTIBODIES (THYROPEROXIDASE & THYROGLOBULIN)
Thyroglobulin Antibody: <1 [IU]/mL (ref 0.0–0.9)
Thyroperoxidase Ab SerPl-aCnc: 66 [IU]/mL — ABNORMAL HIGH (ref 0–34)

## 2024-04-29 LAB — ALLERGEN, WHEAT, F4: Wheat IgE: <0.1 kU/L

## 2024-04-29 LAB — CHRONIC URTICARIA PD-BAT: Pooled Donor- BAT CU: 9.5 % (ref 0.00–10.60)

## 2024-05-02 ENCOUNTER — Telehealth: Payer: Self-pay | Admitting: Allergy & Immunology

## 2024-05-02 ENCOUNTER — Ambulatory Visit: Payer: Self-pay | Admitting: Allergy & Immunology

## 2024-05-02 DIAGNOSIS — R21 Rash and other nonspecific skin eruption: Secondary | ICD-10-CM

## 2024-05-02 DIAGNOSIS — R7689 Other specified abnormal immunological findings in serum: Secondary | ICD-10-CM

## 2024-05-02 NOTE — Telephone Encounter (Signed)
 Anjannette called and stated she would like a call to go over her lab work ASAP. Best contact [phone]

## 2024-05-03 NOTE — Telephone Encounter (Signed)
 Hannah Short called back and states she really wants to speak to Dr. Iva about her labs.  She has questions about a couple of them.  Hannah Short wants Dr. Iva to know she is still having shortness of breath and states she just feels cruddy  Hannah Short states her appointment with Rheumatology isn't until December and wants to know if this is ok or does she need to be seen somewhere else sooner?

## 2024-05-04 ENCOUNTER — Telehealth: Payer: Self-pay

## 2024-05-04 ENCOUNTER — Ambulatory Visit (INDEPENDENT_AMBULATORY_CARE_PROVIDER_SITE_OTHER): Admitting: Family Medicine

## 2024-05-04 ENCOUNTER — Encounter: Payer: Self-pay | Admitting: Family Medicine

## 2024-05-04 VITALS — BP 122/88 | HR 77 | Temp 98.4°F | Resp 18

## 2024-05-04 DIAGNOSIS — J4530 Mild persistent asthma, uncomplicated: Secondary | ICD-10-CM | POA: Diagnosis not present

## 2024-05-04 DIAGNOSIS — R0602 Shortness of breath: Secondary | ICD-10-CM | POA: Diagnosis not present

## 2024-05-04 DIAGNOSIS — R899 Unspecified abnormal finding in specimens from other organs, systems and tissues: Secondary | ICD-10-CM

## 2024-05-04 DIAGNOSIS — R109 Unspecified abdominal pain: Secondary | ICD-10-CM

## 2024-05-04 DIAGNOSIS — R21 Rash and other nonspecific skin eruption: Secondary | ICD-10-CM

## 2024-05-04 NOTE — Progress Notes (Signed)
 [address] [place] [address] Dept: [phone]  FOLLOW UP NOTE  Patient ID: Hannah Short, female    DOB: 1956-02-24  Age: 68 y.o. MRN: [id number] Date of Office Visit: 05/04/2024  Assessment  Chief Complaint: Shortness of Breath (SOB and Chest Tightness) and Advice Only (Discuss lab results)  HPI Hannah Short is a 68 year old female who presents to the clinic for an acute evaluation of breathing. She was last seen in this clinic on 04/05/2024 by Dr. Iva as a new patient for evaluation of asthma with recent pneumonia and rash.   Discussed the use of AI scribe software for clinical note transcription with the patient, who gave verbal consent to proceed.  History of Present Illness Hannah Short is a 68 year old female with asthma who presents with shortness of breath and a persistent rash. She was referred by dermatology for further evaluation of her symptoms.  She has experienced significant shortness of breath since March, which worsens with activity and talking. Initially treated with an inhaler and albuterol for pneumonia diagnosed in July, she continues to experience chest tightness, particularly on the left side, describing her breathing as 'really short' and 'tight'. Symptoms improve when inactive but worsen with physical activity. No fever, but she reports her face, arms, and upper body getting 'really hot', with episodes lasting three to five minutes. She uses a nebulizer with budesonide  every eight to twelve hours and has used albuterol three times in the past week, feeling she could benefit from more frequent use. Sputum is thick and clear, and there was no recent illness prior to the pneumonia diagnosis in July. She denies chest pain or palpitations. She denies recent surgery or an extended period of inactivity. She is not currently taking any compounds containing estrogen. She does report some improvement in her shortness of breath while using  budesonide  twice a day.   She has had a persistent rash since March, evaluated by a dermatologist. 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 Lab workup on 04/25/2024 indicated elevated thyroperoxidase antibodies and positive ANA. At that time, createnine was elevated and GFR was decreased to 56  She experiences jitteriness, particularly without albuterol, describing her hands as jittery and feeling 'quivering on the inside'. She also reports brain fog, decreased concentration, and recall issues, which she attributes to aging. She has gained two pounds and experiences tightness and discomfort in her abdomen after using her inhaler, which she uses twice daily.  She has previously seen Dr. Court, cardiologist with  heart care with her last visit on 07/07/2022 for evaluation of atypical chest pain with coronary calcium score 0 measured at on 12/18/2019.  She reported that her chest pain had resolved at the visit.  Dyspnea on exertion with 2D echo on 12/18/2019 showing grade 1 diastolic dysfunction, and hypertension.  She was advised to follow-up as needed.  She visited the emergency department on 11/04/2022 for frank blood in her stool, abdominal pain, nausea, and decreased appetite.  CT abdomen and pelvis with contrast indicated equivocal pericolonic edema about the proximal descending colon versus nondistention, cholelithiasis without acute cholecystitis, small hiatal hernia, and aortic atherosclerosis.  She continues to follow-up with Dr. Albertus, gastroenterology specialist at [facility] gastroenterology with her last office visit on 11/09/2022.  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 [employer] fundoplication around 1997. Colonoscopy on 12/31/2022 with findings including hemorrhoids and small mouth diverticula in the sigmoid colon.  We discussed lab results in detail. She currently has an appointment for evaluation with Rheumatology in December 2025. She will follow up with contacting her primary care provider regarding abnormal renal function labs. She will contact her cardiologist for chest tightness and dyspnea and her gastroenterologist for abdominal symptoms.   Her current medications are listed in the chart.  Of note, medical records have been requested again for skin biopsy results. Also requested medical record from [facility] around pneumonia in July in order to get a more clear picture of events.   Drug Allergies:  Allergies  Allergen Reactions   Topamax [Topiramate] Other (See Comments)    Pt.states makes legs go numb   Betanidine [Bethanidine]    Clonazepam     BALANCE ISSUE   Flagyl [Metronidazole]    Sulfa Antibiotics     Physical Exam: BP 122/88   Pulse 77   Temp 98.4 F (36.9 C)   Resp 18   SpO2 97%    Physical Exam Vitals reviewed.  Constitutional:      Appearance: Normal appearance. She is well-developed.  HENT:     Head: Normocephalic and atraumatic.     Right Ear: Tympanic membrane normal.     Left Ear: Tympanic membrane normal.     Nose:     Comments: Bilateral nares normal. Pharynx normal. Ears normal. Eyes normal    Mouth/Throat:     Pharynx: Oropharynx is clear.  Eyes:     Conjunctiva/sclera: Conjunctivae normal.  Cardiovascular:     Rate and Rhythm: Normal rate and regular rhythm.     Heart sounds: Normal heart sounds.  Pulmonary:     Breath sounds: Normal breath sounds.  Musculoskeletal:     Cervical back: Normal range of motion and neck supple.  Skin:    General: Skin is warm and dry.     Comments: Flushing noted on her  neck. Skin with sca  Neurological:     Mental Status: She is alert and oriented to person, place, and time.  Psychiatric:        Mood and Affect: Mood normal.        Behavior: Behavior normal.        Thought Content: Thought content normal.        Judgment: Judgment normal.     Diagnostics: FVC 1.83 which is 64% of predicted value, FEV1 1.34 which is 60% of predicted value.  Spirometry indicates possible restriction.  Assessment and Plan: 1. Shortness of breath   2. Mild persistent asthma without complication   3. Rash and nonspecific skin eruption   4. Abdominal pain, unspecified abdominal location   5. Abnormal laboratory test     Patient Instructions  Asthma Continue budesonide  0.5 mg twice a day via nebulizer to control asthma. Continue albuterol 2 puffs once every 4 hours as needed for cough or wheeze Get a chest xray. We will call you when the results become available Follow up with your cardiologist for dyspnea on exertion Go to the ED or call the clinic for any increase in shortness of breath  Rash Continue a daily moisturizing routine Begin Eucrisa to red and itchy areas up to twice a day if  needed for itch Continue clobetasol  to red and itchy areas under your face up to twice a day if needed. Do not use this medication longet than 2 weeks in a row Consider patch testing when your breathing is more controlled  Abdominal pain Follow up with your GI specialist for evaluation of abdominal pain and trouble swallowing  Abnormal labs Follow up with rheumatology for further evaluation of ANA Follow up with your primary care provider for follow up renal function lab and possible thyroid  function testing since you have elevated thyroid  antibodies  Call the clinic if this treatment plan is not working well for you  Follow up in the clinic 2 weeks or sooner if needed.   Return in about 2 weeks (around 05/18/2024), or if symptoms worsen or fail to improve.    Thank you  for the opportunity to care for this patient.  Please do not hesitate to contact me with questions.  Arlean Mutter, FNP [facility] 

## 2024-05-04 NOTE — Patient Instructions (Addendum)
 Asthma Continue budesonide  0.5 mg twice a day via nebulizer to control asthma. Continue albuterol 2 puffs once every 4 hours as needed for cough or wheeze Get a chest xray. We will call you when the results become available Follow up with your cardiologist for dyspnea on exertion Go to the ED or call the clinic for any increase in shortness of breath  Rash Continue a daily moisturizing routine Begin Eucrisa to red and itchy areas up to twice a day if needed for itch Continue clobetasol  to red and itchy areas under your face up to twice a day if needed. Do not use this medication longet than 2 weeks in a row Consider patch testing when your breathing is more controlled  Abdominal pain Follow up with your GI specialist for evaluation of abdominal pain and trouble swallowing  Abnormal labs Follow up with rheumatology for further evaluation of ANA Follow up with your primary care provider for follow up renal function lab and possible thyroid  function testing since you have elevated thyroid  antibodies  Call the clinic if this treatment plan is not working well for you  Follow up in the clinic 2 weeks or sooner if needed.

## 2024-05-04 NOTE — Telephone Encounter (Signed)
 I made an appt for her to see Arlean this afternoon. Patient is not doing too well. She states the breathing treatments are helping but she is still very SOB. She has not had a recent CXR.

## 2024-05-04 NOTE — Telephone Encounter (Signed)
 Are there any Nurse Practitioner openings at all? We work closely with them and can discuss the patients with them so we are all on the same page.   Rheumatology in December is actually really good. They are always booked out car in advance.   Is the Pulmicort  (budesonide ) treatments twice daily doing anything to help with her breathing?   Has she had a recent CXR?   Marty Shaggy, MD [facility] 

## 2024-05-04 NOTE — Telephone Encounter (Signed)
 Medical Record Request has been faxed to [facility] in [place] at [phone]. Form has been placed in the red accordion folder at the rdv office under the F section until records come back.

## 2024-05-04 NOTE — Telephone Encounter (Signed)
 Medical Record Request form has been faxed to [facility] at [phone]. Form has been placed in the red accordion folder at the rdv office in the F section until records come back

## 2024-05-05 ENCOUNTER — Ambulatory Visit: Payer: Self-pay | Admitting: Family Medicine

## 2024-05-05 ENCOUNTER — Encounter: Payer: Self-pay | Admitting: Family Medicine

## 2024-05-05 ENCOUNTER — Ambulatory Visit (HOSPITAL_COMMUNITY)
Admission: RE | Admit: 2024-05-05 | Discharge: 2024-05-05 | Disposition: A | Discharge status: Home / Self Care | Source: Ambulatory Visit | Attending: Family Medicine | Admitting: Family Medicine

## 2024-05-05 ENCOUNTER — Other Ambulatory Visit: Payer: Self-pay | Admitting: Family Medicine

## 2024-05-05 DIAGNOSIS — J4530 Mild persistent asthma, uncomplicated: Secondary | ICD-10-CM | POA: Insufficient documentation

## 2024-05-05 DIAGNOSIS — R21 Rash and other nonspecific skin eruption: Secondary | ICD-10-CM | POA: Insufficient documentation

## 2024-05-05 DIAGNOSIS — R899 Unspecified abnormal finding in specimens from other organs, systems and tissues: Secondary | ICD-10-CM | POA: Insufficient documentation

## 2024-05-05 DIAGNOSIS — R0602 Shortness of breath: Secondary | ICD-10-CM | POA: Insufficient documentation

## 2024-05-05 MED ORDER — FORMOTEROL FUMARATE 20 MCG/2ML IN NEBU: 20.0000 ug | INHALATION_SOLUTION | Freq: Two times a day (BID) | RESPIRATORY_TRACT | 0 refills | Status: DC

## 2024-05-05 NOTE — Progress Notes (Signed)
 Patient notified of negative chest xray. Will send in a long acting bata agonist to add to budesonide  twice a day. She will contact her GI and cardiac specialists for further evaluation of dyspnea and abdominal pain. She agrees to call the clinic or go to the ED with any worsening symptoms related to her breathing.

## 2024-05-05 NOTE — Progress Notes (Signed)
   05/04/24 1830  Visit Method  Type of Visit In-person

## 2024-05-06 ENCOUNTER — Other Ambulatory Visit: Payer: Self-pay | Admitting: Family Medicine

## 2024-05-06 DIAGNOSIS — R0602 Shortness of breath: Secondary | ICD-10-CM

## 2024-05-06 NOTE — Progress Notes (Signed)
 Difficult to treat asthma labs added

## 2024-05-06 NOTE — Telephone Encounter (Signed)
 Thanks for seeing her, Arlean!!

## 2024-05-08 ENCOUNTER — Other Ambulatory Visit (HOSPITAL_COMMUNITY): Payer: Self-pay

## 2024-05-08 ENCOUNTER — Telehealth: Payer: Self-pay

## 2024-05-08 NOTE — Telephone Encounter (Signed)
*  AA  Pharmacy Patient Advocate Encounter   Received notification from CoverMyMeds that prior authorization for Formoterol Fumarate 20MCG/2ML nebulizer solution  is required/requested.   Insurance verification completed.   The patient is insured through [employer].   Per test claim: Medication is not eligible for pharmacy benefits and must be billed through medical insurance. As our team only handles pharmacy related prior auths, medical PA's must be submitted by the clinic. Thank you

## 2024-05-09 ENCOUNTER — Ambulatory Visit: Attending: Cardiology | Admitting: Cardiovascular Disease

## 2024-05-09 ENCOUNTER — Telehealth: Payer: Self-pay | Admitting: Allergy & Immunology

## 2024-05-09 ENCOUNTER — Encounter: Payer: Self-pay | Admitting: Cardiovascular Disease

## 2024-05-09 VITALS — BP 118/71 | HR 73 | Resp 16 | Ht 62.0 in | Wt 190.0 lb

## 2024-05-09 DIAGNOSIS — I10 Essential (primary) hypertension: Secondary | ICD-10-CM | POA: Diagnosis not present

## 2024-05-09 DIAGNOSIS — R0789 Other chest pain: Secondary | ICD-10-CM | POA: Diagnosis not present

## 2024-05-09 DIAGNOSIS — R0602 Shortness of breath: Secondary | ICD-10-CM | POA: Insufficient documentation

## 2024-05-09 NOTE — Assessment & Plan Note (Signed)
 History of atypical chest pain in the past with a coronary calcium score of 0 12/18/2019

## 2024-05-09 NOTE — Assessment & Plan Note (Signed)
 Morbid obesity with a BMI of 30.4.  She was attempting to lose weight with GLP-1 agonist but this was unsuccessful.

## 2024-05-09 NOTE — Telephone Encounter (Signed)
 Hannah Short is scheduled with Rheumatology for 12/23 at 1:00 pm with Dr. Luba

## 2024-05-09 NOTE — Assessment & Plan Note (Signed)
 History of essential hypertension blood pressure measured today 118/71.  She is on low-dose amlodipine .

## 2024-05-09 NOTE — Progress Notes (Signed)
 05/09/2024 [name]   08/13/1955  [id number]  Primary Physician Dow Longs, PA-C Primary Cardiologist: Dorn JINNY Lesches MD GENI CODY MADEIRA, FSCAI  HPI:  Hannah Short is a 68 y.o.  moderately overweight married Caucasian female mother of 5 children, grandmother of 15 grandchildren and great grandmother of 3 grandchildren who is referred by Longs Dow, PA-C, her PCP, for evaluation of atypical chest pain and dyspnea.  I have taken care of her pastor Marsha Anon, her mother Dorothyann Blush and her brother Beryl Mirza.  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  I last saw her in the office 07/07/2022.  She has no cardiac risk factors.  She is never had a heart attack or stroke.  She has had a Nissen fundoplication 24 years ago for hiatal hernia.  She also has GERD and is on a PPI which somewhat improves her chest pain.  She is also noticed increasing dyspnea over the last month.   She had ave a normal 2D echo and a coronary calcium score of 0 back in 2021..  I did refer her to weight loss center and she is motivated to continue to lose weight although she is at the same weight that she was 2 years ago when I last saw her.  Since I saw her last she was recently seen in emergency room with some shortness of breath.  She has also had a rash in question pneumonia.  She is on inhaled bronchodilators.   Current Meds  Medication Sig   albuterol (PROVENTIL) (2.5 MG/3ML) 0.083% nebulizer solution SMARTSIG:1 Vial(s) Via Nebulizer Every 4-6 Hours PRN   amLODipine  (NORVASC ) 2.5 MG tablet TAKE ONE TABLET BY MOUTH ONCE DAILY   atomoxetine (STRATTERA) 18 MG capsule Take 18 mg by mouth daily.   budesonide  (PULMICORT ) 0.5 MG/2ML nebulizer solution Use one vial 3 times daily as needed during flares.   cholecalciferol (VITAMIN D3) 25 MCG (1000 UNIT) tablet Take 2 tablets (50 mcg total) by mouth daily.   citalopram (CELEXA) 10 MG  tablet Take 10 mg by mouth daily.   clobetasol  ointment (TEMOVATE ) 0.05 % Apply 1 Application topically 2 (two) times daily. Use for two weeks at a time top.   famotidine (PEPCID) 40 MG tablet Take 40 mg by mouth daily.   fexofenadine (ALLEGRA) 180 MG tablet Take 180 mg by mouth daily as needed.   fluticasone (FLONASE) 50 MCG/ACT nasal spray SMARTSIG:2 Spray(s) Both Nares Every Night   formoterol (PERFOROMIST) 20 MCG/2ML nebulizer solution Take 2 mLs (20 mcg total) by nebulization 2 (two) times daily.   omeprazole (PRILOSEC) 40 MG capsule Take 40 mg by mouth daily.   Simethicone 125 MG CAPS Take by mouth as needed.   triamcinolone cream (KENALOG) 0.1 % Apply topically 2 (two) times daily as needed.   valACYclovir (VALTREX) 500 MG tablet Take by mouth.   VENTOLIN HFA 108 (90 Base) MCG/ACT inhaler SMARTSIG:1-2 Puff(s) Via Inhaler Every 4-6 Hours PRN     Allergies  Allergen Reactions   Topamax [Topiramate] Other (See Comments)    Pt.states makes legs go numb   Betanidine [Bethanidine]    Clonazepam     BALANCE ISSUE   Flagyl [Metronidazole]    Sulfa Antibiotics     Social History   Socioeconomic History   Marital status: Married    Spouse name: [name]   Number of children: 5   Years  of education: 12   Highest education level: Not on file  Occupational History   Occupation: CNA  Tobacco Use   Smoking status: Never   Smokeless tobacco: Never  Vaping Use   Vaping status: Never Used  Substance and Sexual Activity   Alcohol use: No   Drug use: No   Sexual activity: Not on file  Other Topics Concern   Not on file  Social History Narrative   Patient lives at home with her husband Hannah Short) and father in Social worker and Cordell her daughter.   Patient is taking care of her father full time and disabled daughter.   Education some college.    Right handed.   Caffeine one cup of coffee daily.   Pt states she is increasing her water intake.    Drinks decaf tea.       Social Drivers of  Corporate investment banker Strain: Not on file  Food Insecurity: Not on file  Transportation Needs: Not on file  Physical Activity: Not on file  Stress: Not on file  Social Connections: Not on file  Intimate Partner Violence: Not on file     Review of Systems: General: negative for chills, fever, night sweats or weight changes.  Cardiovascular: negative for chest pain, dyspnea on exertion, edema, orthopnea, palpitations, paroxysmal nocturnal dyspnea or shortness of breath Dermatological: negative for rash Respiratory: negative for cough or wheezing Urologic: negative for hematuria Abdominal: negative for nausea, vomiting, diarrhea, bright red blood per rectum, melena, or hematemesis Neurologic: negative for visual changes, syncope, or dizziness All other systems reviewed and are otherwise negative except as noted above.    Blood pressure 118/71, pulse 73, resp. rate 16, height 5' 2 (1.575 m), weight 190 lb (86.2 kg), SpO2 97%.  General appearance: alert and no distress Neck: no adenopathy, no carotid bruit, no JVD, supple, symmetrical, trachea midline, and thyroid  not enlarged, symmetric, no tenderness/mass/nodules Lungs: clear to auscultation bilaterally Heart: regular rate and rhythm, S1, S2 normal, no murmur, click, rub or gallop Extremities: extremities normal, atraumatic, no cyanosis or edema Pulses: 2+ and symmetric Skin: Skin color, texture, turgor normal. No rashes or lesions Neurologic: Grossly normal  EKG EKG Interpretation Date/Time:  Wednesday May 09 2024 14:31:52 EDT Ventricular Rate:  71 PR Interval:  178 QRS Duration:  110 QT Interval:  434 QTC Calculation: 471 R Axis:   -49  Text Interpretation: Normal sinus rhythm Left anterior fascicular block Left ventricular hypertrophy ( R in aVL , Cornell product , Romhilt-Estes ) Nonspecific ST abnormality No previous ECGs available Confirmed by Court Carrier [phone]) on 05/09/2024 3:05:09 PM    ASSESSMENT AND  PLAN:   Atypical chest pain History of atypical chest pain in the past with a coronary calcium score of 0 12/18/2019  Shortness of breath Shortness of breath which seem to have been exacerbated recently.  She did have a normal 2D echo in 2021.  I am going to repeat this.  She is on inhaled bronchodilators.  Essential hypertension History of essential hypertension blood pressure measured today 118/71.  She is on low-dose amlodipine .  Morbid obesity (HCC) Morbid obesity with a BMI of 30.4.  She was attempting to lose weight with GLP-1 agonist but this was unsuccessful.     Carrier DOROTHA Court MD FACP,FACC,FAHA, [facility] 05/09/2024 3:14 PM

## 2024-05-09 NOTE — Telephone Encounter (Signed)
 Great thank you!

## 2024-05-09 NOTE — Patient Instructions (Signed)
 Medication Instructions:  Your physician recommends that you continue on your current medications as directed. Please refer to the Current Medication list given to you today.  *If you need a refill on your cardiac medications before your next appointment, please call your pharmacy*   Testing/Procedures: Your physician has requested that you have an echocardiogram. Echocardiography is a painless test that uses sound waves to create images of your heart. It provides your doctor with information about the size and shape of your heart and how well your heart's chambers and valves are working. This procedure takes approximately one hour. There are no restrictions for this procedure. Please do NOT wear cologne, perfume, aftershave, or lotions (deodorant is allowed). Please arrive 15 minutes prior to your appointment time.  Please note: We ask at that you not bring children with you during ultrasound (echo/ vascular) testing. Due to room size and safety concerns, children are not allowed in the ultrasound rooms during exams. Our front office staff cannot provide observation of children in our lobby area while testing is being conducted. An adult accompanying a patient to their appointment will only be allowed in the ultrasound room at the discretion of the ultrasound technician under special circumstances. We apologize for any inconvenience.   Follow-Up: At [facility], you and your health needs are our priority.  As part of our continuing mission to provide you with exceptional heart care, our providers are all part of one team.  This team includes your primary Cardiologist (physician) and Advanced Practice Providers or APPs (Physician Assistants and Nurse Practitioners) who all work together to provide you with the care you need, when you need it.  Your next appointment:   12 month(s)  Provider:   Dorn Lesches, MD

## 2024-05-09 NOTE — Assessment & Plan Note (Signed)
 Shortness of breath which seem to have been exacerbated recently.  She did have a normal 2D echo in 2021.  I am going to repeat this.  She is on inhaled bronchodilators.

## 2024-05-11 NOTE — Telephone Encounter (Signed)
 Can we please get this cmn started or call the insurance and find out what is approved for a long acting beta agonist via nebulizer (proforomist or brovana or arformoterol) thank you

## 2024-05-11 NOTE — Telephone Encounter (Signed)
 Hannah Short called in and states [place] from [facility] in [place] told her she needed a prior auth and that they couldn't fill the medication.

## 2024-05-14 MED ORDER — ARFORMOTEROL TARTRATE 15 MCG/2ML IN NEBU: 15.0000 ug | INHALATION_SOLUTION | Freq: Two times a day (BID) | RESPIRATORY_TRACT | 5 refills | Status: DC

## 2024-05-14 NOTE — Telephone Encounter (Signed)
 Spoke with patient and informed her off changes to medication. Brovana has been sent into [facility]. Verbalized understanding.

## 2024-05-14 NOTE — Addendum Note (Signed)
 Addended by: MARCINE ISAIAH CROME on: 05/14/2024 03:50 PM   Modules accepted: Orders

## 2024-05-14 NOTE — Telephone Encounter (Signed)
 Thank you so much

## 2024-05-15 MED ORDER — ARFORMOTEROL TARTRATE 15 MCG/2ML IN NEBU: 15.0000 ug | INHALATION_SOLUTION | Freq: Two times a day (BID) | RESPIRATORY_TRACT | 5 refills | Status: DC

## 2024-05-15 NOTE — Addendum Note (Signed)
 Addended by: FRANCIS ROULEAU A on: 05/15/2024 03:49 PM   Modules accepted: Orders

## 2024-05-16 ENCOUNTER — Ambulatory Visit (INDEPENDENT_AMBULATORY_CARE_PROVIDER_SITE_OTHER): Payer: PRIVATE HEALTH INSURANCE | Admitting: Internal Medicine

## 2024-05-16 ENCOUNTER — Encounter: Payer: Self-pay | Admitting: Internal Medicine

## 2024-05-16 VITALS — BP 124/70 | HR 60 | Ht 63.5 in | Wt 194.0 lb

## 2024-05-16 DIAGNOSIS — R1084 Generalized abdominal pain: Secondary | ICD-10-CM

## 2024-05-16 DIAGNOSIS — R194 Change in bowel habit: Secondary | ICD-10-CM | POA: Diagnosis not present

## 2024-05-16 DIAGNOSIS — R635 Abnormal weight gain: Secondary | ICD-10-CM

## 2024-05-16 DIAGNOSIS — R140 Abdominal distension (gaseous): Secondary | ICD-10-CM | POA: Diagnosis not present

## 2024-05-16 DIAGNOSIS — R600 Localized edema: Secondary | ICD-10-CM | POA: Diagnosis not present

## 2024-05-16 DIAGNOSIS — K219 Gastro-esophageal reflux disease without esophagitis: Secondary | ICD-10-CM

## 2024-05-16 DIAGNOSIS — Z800 Family history of malignant neoplasm of digestive organs: Secondary | ICD-10-CM

## 2024-05-16 DIAGNOSIS — K449 Diaphragmatic hernia without obstruction or gangrene: Secondary | ICD-10-CM

## 2024-05-16 NOTE — Progress Notes (Signed)
 Subjective:    Patient ID: Hannah Short, female    DOB: 12/15/1955, 68 y.o.   MRN: [id number]  HPI Hannah Short is a 68 year old female who presents with severe bloating and weight gain.  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  She has experienced severe bloating and a weight gain of four pounds since her last visit. She describes feeling 'real bloated' and 'miserable' with her legs appearing swollen. She has been using simethicone and Ibgard for relief, which she states helps.  Her gastrointestinal history includes a colonoscopy on Dec 31, 2022, revealing diverticulosis in the sigmoid and hemorrhoids. A previous colonoscopy on January 13, 2022, showed a 10 mm lipoma in the colon, three polyps ranging from 3 to 8 mm in size, and sigmoid diverticulosis. Two of these polyps were adenomas. An upper endoscopy on the same date was normal except for a 2 cm hiatus hernia. Current GI medications include famotidine 40 mg daily and omeprazole 40 mg daily.  She reports changes in her bowel habits, noting that two weeks ago she was having bowel movements three to four times a day, which were 'webby mush' but formed. Currently, she has to strain hard to pass stools, which are soft and formed. She also experiences a lot of gas and describes her stools as not overly constipated but requiring effort to pass.  She mentions a loss of smell and taste, which she attributes to a possible COVID-19 infection in July, during which she experienced severe respiratory symptoms. She was treated with a nebulizer and albuterol, which she continues to use.  She also reports a rash on her hands and arms that started in March or April, initially thought to be eczema, and was treated with creams and prednisone .  Her past  medical history includes fibromyalgia, which causes pain in pressure points, especially in cold weather. She has a family history of congestive heart failure, with both parents and several siblings affected.  Recent lab work from April 25, 2024, showed normal blood counts, liver function tests, and CRP, but elevated IgA. She is not allergic to gluten, and tests for celiac disease and alpha-gal were negative. She is awaiting a rheumatology appointment due to an elevated ANA, which may indicate an autoimmune condition.  Review of Systems As per HPI, otherwise negative  Current Medications, Allergies, Past Medical History, Past Surgical History, Family History and Social History were reviewed in [employer] record.    Objective:   Physical Exam BP 124/70   Pulse 60   Ht 5' 3.5 (1.613 m)   Wt 194 lb (88 kg)   BMI 33.83 kg/m  Gen: awake, alert, NAD HEENT: anicteric  CV: RRR, no mrg Pulm: CTA b/l Abd: soft, significant abdominal bloating and distention today, not overtly tender without rebound or guarding, +BS throughout Ext: no c/c, 1+ bilateral LE edema Neuro: nonfocal  LABS AST: 16 (04/25/2024) ALT: 15 (04/25/2024) Alkaline phosphatase: 98 (04/25/2024) Total bilirubin: 0.4 (04/25/2024) CRP: Normal (04/25/2024) IgA: Elevated (04/25/2024) Tissue transglutaminase: Negative (04/25/2024) Wheat IgA: Normal (04/25/2024) Hemoglobin: 14 (04/25/2024) MCV: 93 (04/25/2024) WBC: 6.9 (04/25/2024) Platelet count: 336 (04/25/2024)  RADIOLOGY Chest X-ray: No abnormalities detected (01/2024)  DIAGNOSTIC Colonoscopy: Diverticulosis in sigmoid colon, hemorrhoids (12/31/2022) Colonoscopy: 10 mm lipoma in colon, three polyps (3-8 mm) in size, sigmoid diverticulosis, adenoma times two (01/13/2022)  Upper endoscopy: 2 cm hiatus hernia (01/13/2022)      Assessment & Plan:  68 year old female with a history of adenomatous colon polyps, colonic diverticulosis, chronic  abdominal bloating 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 Differential includes SIBO and gut bacterial imbalance. Normal colonoscopy and endoscopy ruled out structural causes. Labs normal. - Order CT scan of abdomen and pelvis. - Provide breath test kit for SIBO diagnosis. - Instruct to avoid simethicone and Ibgard on breath test day. - Continue simethicone and Ibogard as needed for bloating relief.  Lower extremity edema Bilateral edema with recent weight gain suggests fluid accumulation. Differential includes cardiac issues, given family history of congestive heart failure. Echocardiogram pending, efforts to expedite due to symptom severity. - Send note to Dr. Court and primary care to expedite echocardiogram. - Advise daily weight monitoring and sodium restriction. - Discuss potential low-dose diuretics with primary care  GERD with small hiatal hernia -Continue omeprazole 40 mg daily  Family history of colon cancer and history of adenomatous polyps -On colonoscopy surveillance protocol  30 minutes total spent today including patient facing time, coordination of care, reviewing medical history/procedures/pertinent radiology studies, and documentation of the encounter.

## 2024-05-16 NOTE — Telephone Encounter (Signed)
 Patient called stating she is needing the prescription of Arformoterol resent to [facility] pharmacy in [place].

## 2024-05-16 NOTE — Patient Instructions (Signed)
 You have been scheduled for a CT scan of the abdomen and pelvis at [facility], 1st floor Radiology. You are scheduled on 05/24/24 at 3:00 pm. You should arrive 30 minutes prior to your appointment time for registration.    Please follow the written instructions below on the day of your exam:   1) Do not eat anything after 11:00am (4 hours prior to your test)   You may take any medications as prescribed with a small amount of water, if necessary. If you take any of the following medications: METFORMIN , GLUCOPHAGE , GLUCOVANCE, AVANDAMET, RIOMET , FORTAMET , ACTOPLUS MET, JANUMET, GLUMETZA  or METAGLIP, you MAY be asked to HOLD this medication 48 hours AFTER the exam.    If you have any questions regarding your exam or if you need to reschedule, you may call [facility] at [phone] between the hours of 8:00 am and 5:00 pm, Monday-Friday.    You have been given a testing kit to check for small intestine bacterial overgrowth (SIBO) which is completed by a company named [employer]. Make sure to return your test in the mail using the return mailing label given to you along with the kit. The test order, your demographic and insurance information have all already been sent to the company. [facility] will collect an upfront charge of $109.00 for commercial insurance plans and $229.00 if you are paying cash. The potential remaining total after claim submission and review is $120.00. Make sure to discuss with [facility] PRIOR to having the test to see if they have gotten information from your insurance company as to how much your testing will cost out of pocket, if any. Please contact [facility] at phone number [phone] to get instructions regarding how to perform the test as our office is unable to give specific testing instructions.   _______________________________________________________  If your blood pressure at your visit was 140/90 or greater, please contact  your primary care physician to follow up on this.  _______________________________________________________  If you are age 67 or older, your body mass index should be between 23-30. Your Body mass index is 33.83 kg/m. If this is out of the aforementioned range listed, please consider follow up with your Primary Care Provider.  If you are age 53 or younger, your body mass index should be between 19-25. Your Body mass index is 33.83 kg/m. If this is out of the aformentioned range listed, please consider follow up with your Primary Care Provider.   ________________________________________________________  The [facility] providers would like to encourage you to use MYCHART to communicate with providers for non-urgent requests or questions.  Due to long hold times on the telephone, sending your provider a message by [facility] may be a faster and more efficient way to get a response.  Please allow 48 business hours for a response.  Please remember that this is for non-urgent requests.  _______________________________________________________  [facility] is using a team-based approach to care.  Your team is made up of your doctor and two to three APPS. Our APPS (Nurse Practitioners and Physician Assistants) work with your physician to ensure care continuity for you. They are fully qualified to address your health concerns and develop a treatment plan. They communicate directly with your gastroenterologist to care for you. Seeing the Advanced Practice Practitioners on your physician's team can help you by facilitating care more promptly, often allowing for earlier appointments, access to diagnostic testing, procedures, and other specialty referrals.

## 2024-05-18 MED ORDER — ARFORMOTEROL TARTRATE 15 MCG/2ML IN NEBU: 15.0000 ug | INHALATION_SOLUTION | Freq: Two times a day (BID) | RESPIRATORY_TRACT | 5 refills | Status: DC

## 2024-05-18 NOTE — Addendum Note (Signed)
 Addended by: MARCINE ISAIAH CROME on: 05/18/2024 02:19 PM   Modules accepted: Orders

## 2024-05-18 NOTE — Telephone Encounter (Signed)
 Patient's pharmacy called stating they are needing the diagnoses code for the prescription.

## 2024-05-21 ENCOUNTER — Other Ambulatory Visit: Payer: Self-pay | Admitting: *Deleted

## 2024-05-21 DIAGNOSIS — J4530 Mild persistent asthma, uncomplicated: Secondary | ICD-10-CM

## 2024-05-21 MED ORDER — ARFORMOTEROL TARTRATE 15 MCG/2ML IN NEBU: 15.0000 ug | INHALATION_SOLUTION | Freq: Two times a day (BID) | RESPIRATORY_TRACT | 5 refills | Status: DC

## 2024-05-21 NOTE — Telephone Encounter (Signed)
 Pharmacy needs new Rx with Dx code resent for Brovana, so it can be billed under part B. This has been resent.

## 2024-05-23 ENCOUNTER — Telehealth: Payer: Self-pay

## 2024-05-23 ENCOUNTER — Encounter: Payer: Self-pay | Admitting: Allergy & Immunology

## 2024-05-23 ENCOUNTER — Telehealth: Payer: Self-pay | Admitting: Allergy & Immunology

## 2024-05-23 ENCOUNTER — Ambulatory Visit: Admitting: Allergy & Immunology

## 2024-05-23 VITALS — BP 132/86 | HR 74 | Temp 97.6°F | Wt 193.6 lb

## 2024-05-23 DIAGNOSIS — R0602 Shortness of breath: Secondary | ICD-10-CM

## 2024-05-23 DIAGNOSIS — J4530 Mild persistent asthma, uncomplicated: Secondary | ICD-10-CM

## 2024-05-23 MED ORDER — ARFORMOTEROL TARTRATE 15 MCG/2ML IN NEBU
15.0000 ug | INHALATION_SOLUTION | Freq: Two times a day (BID) | RESPIRATORY_TRACT | 5 refills | Status: DC
Start: 2024-05-23 — End: 2024-05-23

## 2024-05-23 MED ORDER — ALBUTEROL SULFATE (2.5 MG/3ML) 0.083% IN NEBU: 2.5000 mg | INHALATION_SOLUTION | RESPIRATORY_TRACT | 1 refills | Status: DC | PRN

## 2024-05-23 MED ORDER — ARFORMOTEROL TARTRATE 15 MCG/2ML IN NEBU: 15.0000 ug | INHALATION_SOLUTION | Freq: Two times a day (BID) | RESPIRATORY_TRACT | 5 refills | Status: DC

## 2024-05-23 MED ORDER — BUDESONIDE 0.5 MG/2ML IN SUSP: RESPIRATORY_TRACT | 1 refills | Status: DC

## 2024-05-23 MED ORDER — ARFORMOTEROL TARTRATE 15 MCG/2ML IN NEBU: 15.0000 ug | INHALATION_SOLUTION | Freq: Two times a day (BID) | RESPIRATORY_TRACT | 3 refills | Status: DC

## 2024-05-23 NOTE — Patient Instructions (Addendum)
 Asthma - Continue budesonide  0.5 mg twice a day via nebulizer to control asthma. - We are going to work on getting the aformoterol approved (I sent in a new medication).  - Please get those labs done (new requisition provided).  - These labs might help us  determine whether or not you have asthma.  - CALL US  if these medications are too expensive and we will try to send it to [facility], which specializes in nebulizer medications.   Rash - Continue a daily moisturizing routine - Continue with Eucrisa to red and itchy areas up to twice a day if needed for itch - Continue clobetasol  to red and itchy areas under your face up to twice a day if needed. Do not use this medication longet than 2 weeks in a row - Consider patch testing when your breathing is more controlled  Abdominal pain - We will see what your chest CT shows.   Elevated ANA - Follow up with rheumatology for further evaluation of ANA - Follow up with your primary care provider for follow up renal function lab and possible thyroid  function testing since you have elevated thyroid  antibodies  Return in about 4 weeks (around 06/20/2024). You can have the follow up appointment with Dr. Iva or a Nurse Practicioner (our Nurse Practitioners are excellent and always have Physician oversight!).    Please inform us  of any Emergency Department visits, hospitalizations, or changes in symptoms. Call us  before going to the ED for breathing or allergy symptoms since we might be able to fit you in for a sick visit. Feel free to contact us  anytime with any questions, problems, or concerns.  It was a pleasure to see you again today!  Websites that have reliable patient information: 1. American Academy of Asthma, Allergy, and Immunology: www.aaaai.org 2. Food Allergy Research and Education (FARE): foodallergy.org 3. Mothers of Asthmatics: http://www.asthmacommunitynetwork.org 4. American College of Allergy, Asthma, and Immunology:  www.acaai.org      "Like" us  on Facebook and Instagram for our latest updates!      A healthy democracy works best when [employer] participate! Make sure you are registered to vote! If you have moved or changed any of your contact information, you will need to get this updated before voting! Scan the QR codes below to learn more!

## 2024-05-23 NOTE — Addendum Note (Signed)
 Addended by: TERESSA ROSINA NOVAK on: 05/23/2024 04:38 PM   Modules accepted: Orders

## 2024-05-23 NOTE — Progress Notes (Signed)
 FOLLOW UP  Date of Service/Encounter:  05/23/24   Assessment:   Rash (present since May 2025) - sees Dermatologist, but only minimally responsive to topical steroids as well as prednisone    Mild intermittent asthma, uncomplicated   Recent pneumonia  I am still not convinced that her breathing issues have anything to do with asthma at all.  She does report some transient improvement with albuterol, but otherwise, her budesonide  does not seem to be doing much of anything.  I think the abdominal CT is gena be very revealing.  She might be having some difficulty with breathing due to the increased abdominal mass affect.  Plan/Recommendations:   Asthma - Continue budesonide  0.5 mg twice a day via nebulizer to control asthma. - We are going to work on getting the aformoterol approved (I sent in a new medication).  - Please get those labs done (new requisition provided).  - These labs might help us  determine whether or not you have asthma.  - CALL US  if these medications are too expensive and we will try to send it to [facility], which specializes in nebulizer medications.   Rash - Continue a daily moisturizing routine - Continue with Eucrisa to red and itchy areas up to twice a day if needed for itch - Continue clobetasol  to red and itchy areas under your face up to twice a day if needed. Do not use this medication longet than 2 weeks in a row - Consider patch testing when your breathing is more controlled  Abdominal pain - We will see what your chest CT shows.   Elevated ANA - Follow up with rheumatology for further evaluation of ANA - Follow up with your primary care provider for follow up renal function lab and possible thyroid  function testing since you have elevated thyroid  antibodies  Return in about 4 weeks (around 06/20/2024). You can have the follow up appointment with Dr. Iva or a Nurse Practicioner (our Nurse Practitioners are excellent and always have Physician  oversight!).   Subjective:   Hannah Short is a 68 y.o. female presenting today for follow up of  Chief Complaint  Patient presents with   Asthma    Pt's symptoms have not improved. She has not been able to get her Brovana prescription from her previous appt due to insurance issues. I resent rx to [facility] due to [facility] being unable to fill due to insurance.     Hannah Short has a history of the following: Patient Active Problem List   Diagnosis Date Noted   Mild persistent asthma without complication 05/05/2024   Rash and nonspecific skin eruption 05/05/2024   Abnormal laboratory test 05/05/2024   Essential hypertension 03/04/2020   Morbid obesity (HCC) 03/04/2020   Shortness of breath 11/28/2019   Abdominal pain 11/19/2016   Change in bowel habits 11/19/2016   Atypical chest pain 11/19/2016   Bloating 11/19/2016   Migraine    Fibromyalgia     History obtained from: chart review and patient.  Discussed the use of AI scribe software for clinical note transcription with the patient and/or guardian, who gave verbal consent to proceed.  Hannah Short is a 68 y.o. female presenting for a follow up visit. She was last seen in October 2025 by [facility] positioners.  At that time, she was continued on budesonide  0.5 mg twice daily and albuterol.  She was encouraged to get a chest x-ray and follow-up with her cardiologist for her dyspnea.  For the rash, she was  started on Eucrisa and continue clobetasol .  Patch testing was discussed and recommended to take place after her breathing was under better control.  She was endorsing some abdominal pain and she was encouraged to follow-up with her GI specialist.  She already had an appointment for evaluation of her elevated ANA.  She has an appointment on 1223 for evaluation of her elevated ANA.  Her chest x-ray was normal.  She never got the labs done at the last visit. Anne send labs to look for serious causes of asthma.  Since last visit,  he has been around the same.  Asthma/Respiratory Symptom History: She experiences shortness of breath described as 'short and shallow,' which began after a recent weight gain of approximately 7 pounds since March. She currently weighs 193 pounds, up from 179 pounds. She uses albuterol once or twice a day but tries to avoid it due to shakiness. Budesonide  is also used, but she has not received other prescribed medications due to insurance issues. The shortness of breath was not present before the weight gain, and albuterol provides some relief despite causing shakiness. No history of asthma prior to these recent events. She also mentions a sensation of being 'choked' when taking deep breaths.   She reports coughing up white, milky sputum in the mornings, which she suspects might be sinus drainage. She recalls having pneumonia in July, after which her symptoms began to worsen.   She has abdominal pain and is scheduled to undergo a CT scan of her abdomen. A history of IBS causes irregular bowel movements, ranging from firm to loose stools. She describes her abdomen as 'protuberant' and likens it to being 'post pregnant'.  Skin Symptom History: She mentions a persistent rash that has not responded to over-the-counter allergy medications like Claritin. Benadryl typically resolves her rashes, but it has not been effective this time.  Otherwise, there have been no changes to her past medical history, surgical history, family history, or social history.    Review of systems otherwise negative other than that mentioned in the HPI.    Objective:   Blood pressure 132/86, pulse 74, temperature 97.6 F (36.4 C), temperature source Temporal, weight 193 lb 9.6 oz (87.8 kg), SpO2 96%. Body mass index is 33.76 kg/m.    Physical Exam Vitals reviewed.  Constitutional:      Appearance: She is well-developed.  HENT:     Head: Normocephalic and atraumatic.     Right Ear: Tympanic membrane, ear canal and  external ear normal. No drainage, swelling or tenderness. Tympanic membrane is not injected, scarred, erythematous, retracted or bulging.     Left Ear: Tympanic membrane, ear canal and external ear normal. No drainage, swelling or tenderness. Tympanic membrane is not injected, scarred, erythematous, retracted or bulging.     Nose: No nasal deformity, septal deviation, mucosal edema or rhinorrhea.     Right Turbinates: Enlarged, swollen and pale.     Left Turbinates: Enlarged, swollen and pale.     Right Sinus: No maxillary sinus tenderness or frontal sinus tenderness.     Left Sinus: No maxillary sinus tenderness or frontal sinus tenderness.     Mouth/Throat:     Mouth: Mucous membranes are not pale and not dry.     Pharynx: Uvula midline.  Eyes:     General:        Right eye: No discharge.        Left eye: No discharge.     Conjunctiva/sclera: Conjunctivae normal.  Right eye: Right conjunctiva is not injected. No chemosis.    Left eye: Left conjunctiva is not injected. No chemosis.    Pupils: Pupils are equal, round, and reactive to light.  Cardiovascular:     Rate and Rhythm: Normal rate and regular rhythm.     Heart sounds: Normal heart sounds.  Pulmonary:     Effort: Pulmonary effort is normal. No tachypnea, accessory muscle usage or respiratory distress.     Breath sounds: Normal breath sounds. No wheezing, rhonchi or rales.  Chest:     Chest wall: No tenderness.  Abdominal:     General: Bowel sounds are normal.     Tenderness: There is no abdominal tenderness. There is no guarding or rebound.     Comments: Protuberant abdomen.  Lymphadenopathy:     Head:     Right side of head: No submandibular, tonsillar or occipital adenopathy.     Left side of head: No submandibular, tonsillar or occipital adenopathy.     Cervical: No cervical adenopathy.  Skin:    General: Skin is warm.     Capillary Refill: Capillary refill takes less than 2 seconds.     Coloration: Skin is not  pale.     Findings: Rash present. No abrasion, erythema or petechiae. Rash is papular. Rash is not urticarial or vesicular.     Comments: Erythematous papular rash over the bilateal arms.   Neurological:     Mental Status: She is alert.  Psychiatric:        Behavior: Behavior is cooperative.      Diagnostic studies:    Spirometry: results normal (FEV1: 1.66/75%, FVC: 1.97/69%, FEV1/FVC: 84%).    Spirometry consistent with normal pattern.   Allergy Studies: none       Marty Shaggy, MD  [facility] 

## 2024-05-23 NOTE — Telephone Encounter (Signed)
 Patient's pharmacy called stating they need the diagnoses code for her Pulmicort  and albuterol nebulizer medication so they can file it under her Part B.

## 2024-05-24 ENCOUNTER — Ambulatory Visit (HOSPITAL_COMMUNITY)
Admission: RE | Admit: 2024-05-24 | Discharge: 2024-05-24 | Disposition: A | Discharge status: Home / Self Care | Source: Ambulatory Visit | Attending: Internal Medicine | Admitting: Internal Medicine

## 2024-05-24 DIAGNOSIS — R140 Abdominal distension (gaseous): Secondary | ICD-10-CM | POA: Diagnosis present

## 2024-05-24 DIAGNOSIS — R1084 Generalized abdominal pain: Secondary | ICD-10-CM | POA: Insufficient documentation

## 2024-05-24 DIAGNOSIS — R635 Abnormal weight gain: Secondary | ICD-10-CM | POA: Insufficient documentation

## 2024-05-24 MED ORDER — IOHEXOL 300 MG/ML  SOLN
100.0000 mL | Freq: Once | INTRAMUSCULAR | Status: AC | PRN
  Administered 2024-05-24: 100 mL via INTRAVENOUS

## 2024-05-24 MED ORDER — SODIUM CHLORIDE (PF) 0.9 % IJ SOLN
INTRAMUSCULAR | Status: AC
  Filled 2024-05-24: qty 50

## 2024-05-25 ENCOUNTER — Ambulatory Visit: Payer: Self-pay | Admitting: Internal Medicine

## 2024-05-25 DIAGNOSIS — R599 Enlarged lymph nodes, unspecified: Secondary | ICD-10-CM

## 2024-05-25 LAB — ALLERGEN A FUMIGATUS IGG: Aspergillus fumigatus IgG: 4.6 ug/mL — ABNORMAL HIGH (ref 0.0–1.9)

## 2024-05-25 LAB — ANCA TITERS
Atypical pANCA: <1:20 {titer}
C-ANCA: <1:20 {titer}
P-ANCA: <1:20 {titer}

## 2024-05-25 LAB — ALPHA-1-ANTITRYPSIN PHENOTYP: A-1 Antitrypsin: 127 mg/dL (ref 101–187)

## 2024-05-25 LAB — D-DIMER, QUANTITATIVE: D-DIMER: <0.2 mg{FEU}/L (ref 0.00–0.49)

## 2024-05-25 LAB — M003-IGE ASPERGILLUS FUMIGATUS: Aspergillus Fumigatus IgE: <0.1 kU/L

## 2024-05-25 NOTE — Telephone Encounter (Signed)
 Yes, I did this yesterday.  I told her to give us  a call if the price was excessive and we can send to [facility] instead.

## 2024-05-26 ENCOUNTER — Encounter: Payer: Self-pay | Admitting: Internal Medicine

## 2024-05-28 ENCOUNTER — Encounter (HOSPITAL_COMMUNITY): Payer: Self-pay

## 2024-05-28 NOTE — Progress Notes (Signed)
 Hannah Sieving, MD  [name] PROCEDURE / BIOPSY REVIEW Date: 05/28/24  Requested Biopsy site: R ext iliac LAN Reason for request: r/o CA v lymphoma Imaging review: Best seen on CT 2:72  Decision: Approved Imaging modality to perform: CT Schedule with: Moderate Sedation Schedule for: Any VIR  Additional comments: @VIR : send in saline  Please contact me with questions, concerns, or if issue pertaining to this request arise.  Dayne Short Johann, MD [facility]       Previous Messages    ----- Message ----- From: Jaxyn Rout Sent: 05/28/2024   8:55 AM EDT To: Aleanna Menge; Ir Procedure Requests Subject: IR lymph node biopsy                          Procedure: IR lymph node biopsy  Reason : enlarged iliac chain lymph nodes-right side-per CT 05/24/24 Dx: Enlarged lymph nodes [R59.9 (ICD-10-CM)]    History : CT abd pelv w/  Provider : Albertus Gordy HERO, MD  Contact : [phone]

## 2024-05-30 ENCOUNTER — Ambulatory Visit: Payer: Self-pay | Admitting: Family Medicine

## 2024-05-30 NOTE — Progress Notes (Signed)
 Can you please let this patient know her lab work to help us  evaluate her asthma was all within normal limits. How is her breathing now?

## 2024-05-30 NOTE — Telephone Encounter (Signed)
 Patient called regarding her lab work and I advised her of the notation. Patient states it is hard to breathe walking from the living room to the bedroom and gets winded.

## 2024-06-01 NOTE — Progress Notes (Signed)
 Thank you :)

## 2024-06-04 ENCOUNTER — Other Ambulatory Visit: Payer: Self-pay | Admitting: Family Medicine

## 2024-06-04 DIAGNOSIS — R0602 Shortness of breath: Secondary | ICD-10-CM

## 2024-06-04 NOTE — Progress Notes (Unsigned)
 Patient continues to experience shortness of breath, despite adherence to her medications

## 2024-06-04 NOTE — Progress Notes (Signed)
 High resolution CT ordered aster conferencing with Dr. Iva

## 2024-06-11 ENCOUNTER — Other Ambulatory Visit: Payer: Self-pay | Admitting: Radiology

## 2024-06-11 ENCOUNTER — Other Ambulatory Visit: Payer: Self-pay

## 2024-06-11 DIAGNOSIS — R591 Generalized enlarged lymph nodes: Secondary | ICD-10-CM

## 2024-06-12 ENCOUNTER — Other Ambulatory Visit: Payer: Self-pay

## 2024-06-12 ENCOUNTER — Ambulatory Visit (HOSPITAL_COMMUNITY): Admission: RE | Admit: 2024-06-12 | Source: Ambulatory Visit

## 2024-06-12 ENCOUNTER — Ambulatory Visit (HOSPITAL_COMMUNITY)
Admission: RE | Admit: 2024-06-12 | Discharge: 2024-06-12 | Disposition: A | Discharge status: Home / Self Care | Source: Ambulatory Visit | Attending: Internal Medicine | Admitting: Internal Medicine

## 2024-06-12 ENCOUNTER — Encounter (HOSPITAL_COMMUNITY): Payer: Self-pay

## 2024-06-12 ENCOUNTER — Ambulatory Visit (HOSPITAL_COMMUNITY)
Admission: RE | Admit: 2024-06-12 | Discharge: 2024-06-12 | Disposition: A | Source: Ambulatory Visit | Attending: Radiology | Admitting: Radiology

## 2024-06-12 DIAGNOSIS — K219 Gastro-esophageal reflux disease without esophagitis: Secondary | ICD-10-CM | POA: Insufficient documentation

## 2024-06-12 DIAGNOSIS — I10 Essential (primary) hypertension: Secondary | ICD-10-CM | POA: Diagnosis not present

## 2024-06-12 DIAGNOSIS — I341 Nonrheumatic mitral (valve) prolapse: Secondary | ICD-10-CM | POA: Diagnosis not present

## 2024-06-12 DIAGNOSIS — K8020 Calculus of gallbladder without cholecystitis without obstruction: Secondary | ICD-10-CM | POA: Diagnosis not present

## 2024-06-12 DIAGNOSIS — R590 Localized enlarged lymph nodes: Secondary | ICD-10-CM | POA: Diagnosis present

## 2024-06-12 DIAGNOSIS — M797 Fibromyalgia: Secondary | ICD-10-CM | POA: Insufficient documentation

## 2024-06-12 DIAGNOSIS — R194 Change in bowel habit: Secondary | ICD-10-CM | POA: Insufficient documentation

## 2024-06-12 DIAGNOSIS — R599 Enlarged lymph nodes, unspecified: Secondary | ICD-10-CM

## 2024-06-12 DIAGNOSIS — R591 Generalized enlarged lymph nodes: Secondary | ICD-10-CM

## 2024-06-12 DIAGNOSIS — C774 Secondary and unspecified malignant neoplasm of inguinal and lower limb lymph nodes: Secondary | ICD-10-CM | POA: Diagnosis not present

## 2024-06-12 LAB — CBC
HCT: 40 % (ref 36.0–46.0)
Hemoglobin: 13.5 g/dL (ref 12.0–15.0)
MCH: 30.7 pg (ref 26.0–34.0)
MCHC: 33.8 g/dL (ref 30.0–36.0)
MCV: 90.9 fL (ref 80.0–100.0)
Platelets: 270 K/uL (ref 150–400)
RBC: 4.4 MIL/uL (ref 3.87–5.11)
RDW: 14.4 % (ref 11.5–15.5)
WBC: 5.6 K/uL (ref 4.0–10.5)
nRBC: 0 % (ref 0.0–0.2)

## 2024-06-12 LAB — PROTIME-INR
INR: 1 (ref 0.8–1.2)
Prothrombin Time: 13.6 s (ref 11.4–15.2)

## 2024-06-12 MED ORDER — LIDOCAINE HCL (PF) 1 % IJ SOLN
10.0000 mL | Freq: Once | INTRAMUSCULAR | Status: AC
  Administered 2024-06-12: 10 mL via INTRADERMAL

## 2024-06-12 MED ORDER — FENTANYL CITRATE (PF) 100 MCG/2ML IJ SOLN
INTRAMUSCULAR | Status: AC
  Filled 2024-06-12: qty 4

## 2024-06-12 MED ORDER — SODIUM CHLORIDE 0.9 % IV SOLN: INTRAVENOUS | Status: DC

## 2024-06-12 MED ORDER — FENTANYL CITRATE (PF) 100 MCG/2ML IJ SOLN
INTRAMUSCULAR | Status: AC | PRN
  Administered 2024-06-12 (×3): 50 ug via INTRAVENOUS

## 2024-06-12 MED ORDER — MIDAZOLAM HCL (PF) 2 MG/2ML IJ SOLN
INTRAMUSCULAR | Status: AC | PRN
  Administered 2024-06-12 (×3): 1 mg via INTRAVENOUS

## 2024-06-12 MED ORDER — MIDAZOLAM HCL 2 MG/2ML IJ SOLN
INTRAMUSCULAR | Status: AC
  Filled 2024-06-12: qty 4

## 2024-06-12 NOTE — Progress Notes (Signed)
 Discharge instructions reviewed with patient and spouse at bedside. Denies questions concerns. PT tolerated PO intake. Ambulated in the hallway, was able to void without difficulty. Incision site remains clean dry and intact. No s/s of complications. PT escorted from the unit via wheel chair to personal vehicle.

## 2024-06-12 NOTE — H&P (Signed)
 Chief Complaint: Patient was seen in consultation today for lymphadenopathy.  Referring Physician(s): Pyrtle,Jay M  Supervising Physician: Hannah Short  Patient Status: Hannah Short  History of Present Illness: Hannah Short is a 68 y.o. female with a past medical history significant for anxiety, depression, fibromyalgia, GERD, anemia, HTN, mitral valve prolapse, diverticulosis, ischemic colitis, hiatal hernia s/p repair and recently noted right external iliac chain lymphadenopathy who presents today for a lymph node biopsy. [name] presented to the GI office on 05/16/24 with complaints of severe abdominal bloating, changes in bowel habits, weight gain and lower extremity swelling. She underwent CT abd/pelvis w/contrast on 05/24/24 for further evaluation which showed:  1. New pelvic lymphadenopathy in the right external iliac chain. No abdominal lymphadenopathy identified. Differential diagnosis includes lymphoproliferative disorder metastatic disease, and infectious/inflammatory etiologies. Consider PET CT, 60-month follow up by CT, or tissue sampling. 2. Cholelithiasis without evidence of cholecystitis.  She has been referred to IR for right iliac lymph node biopsy to further direct care.  Endorses right sided abdominal pain states that it is IBS  Patient alert and laying in bed,calm. Denies any fevers, headache, chest pain, SOB, cough,nausea, vomiting or bleeding.    Return precautions and treatment recommendations and follow-up discussed with the patient  who is agreeable with the plan.    Past Medical History:  Diagnosis Date   Acute torn meniscus of knee, left, initial encounter 2019   Anemia    Anxiety    Arthritis    Asthma    Back pain    Bulging lumbar disc    Chest pain    Chronically dry eyes, bilateral    Constipation    Depression    Diverticulosis    Fatty liver    Fibromyalgia    Gallstones    GERD (gastroesophageal reflux disease)    Heart  murmur    mitral valve prolapse   Hepatic steatosis    Hiatal hernia    Hypertension    Hypokalemia    Internal hemorrhoids    Joint pain    Lactose intolerance    Lipoma of colon    Migraine    Mitral valve prolapse    Neck pain    L3 4 and 5 bulging discs   Shortness of breath    Tubular adenoma of colon     Past Surgical History:  Procedure Laterality Date   ADENOIDECTOMY Bilateral 1998   CESAREAN SECTION     HERNIA REPAIR     1997   TOE SURGERY  1990   stepped on toothpick    Allergies: Topamax [topiramate], Betanidine [bethanidine], Clonazepam, Flagyl [metronidazole], and Sulfa antibiotics  Medications: Prior to Admission medications   Medication Sig Start Date End Date Taking? Authorizing Provider  albuterol (PROVENTIL) (2.5 MG/3ML) 0.083% nebulizer solution Take 3 mLs (2.5 mg total) by nebulization every 4 (four) hours as needed for wheezing or shortness of breath. 05/23/24   Iva Marty Saltness, MD  amLODipine  (NORVASC ) 2.5 MG tablet TAKE ONE TABLET BY MOUTH ONCE DAILY 11/11/23   Court Dorn PARAS, MD  arformoterol (BROVANA) 15 MCG/2ML NEBU Take 2 mLs (15 mcg total) by nebulization 2 (two) times daily. 05/23/24 09/20/24  Iva Marty Saltness, MD  budesonide  (PULMICORT ) 0.5 MG/2ML nebulizer solution Use one vial 3 times daily as needed during flares. 05/23/24   Iva Marty Saltness, MD  cholecalciferol (VITAMIN D3) 25 MCG (1000 UNIT) tablet Take 2 tablets (50 mcg total) by mouth daily. 09/15/20   [place],  Adelita PENNER, MD  clobetasol  ointment (TEMOVATE ) 0.05 % Apply 1 Application topically 2 (two) times daily. Use for two weeks at a time top. 04/25/24   Iva Marty Saltness, MD  famotidine (PEPCID) 40 MG tablet Take 40 mg by mouth daily.    [provider]  fexofenadine (ALLEGRA) 180 MG tablet Take 180 mg by mouth daily as needed.    [provider]  fluticasone (FLONASE) 50 MCG/ACT nasal spray SMARTSIG:2 Spray(s) Both Nares Every Night 04/19/24    [provider]  omeprazole (PRILOSEC) 40 MG capsule Take 40 mg by mouth daily.    [provider]  Simethicone 125 MG CAPS Take by mouth as needed.    [provider]  valACYclovir (VALTREX) 500 MG tablet Take by mouth. 02/22/24   [provider]  VENTOLIN HFA 108 (90 Base) MCG/ACT inhaler SMARTSIG:1-2 Puff(s) Via Inhaler Every 4-6 Hours PRN 04/19/24   [provider]     Family History  Problem Relation Age of Onset   Heart failure Mother    Heart failure Father    Colon cancer Father    Hypertension Father    Cancer Father    Stomach cancer Neg Hx    Esophageal cancer Neg Hx     Social History   Socioeconomic History   Marital status: Married    Spouse name: [name]   Number of children: 5   Years of education: 12   Highest education level: Not on file  Occupational History   Occupation: CNA  Tobacco Use   Smoking status: Never   Smokeless tobacco: Never  Vaping Use   Vaping status: Never Used  Substance and Sexual Activity   Alcohol use: No   Drug use: No   Sexual activity: Not on file  Other Topics Concern   Not on file  Social History Narrative   Patient lives at home with her husband Garry) and father in social worker and [place] her daughter.   Patient is taking care of her father full time and disabled daughter.   Education some college.    Right handed.   Caffeine one cup of coffee daily.   Pt states she is increasing her water intake.    Drinks decaf tea.       Social Drivers of Corporate Investment Banker Strain: Not on file  Food Insecurity: Not on file  Transportation Needs: Not on file  Physical Activity: Not on file  Stress: Not on file  Social Connections: Not on file     Review of Systems: A 12 point ROS discussed and pertinent positives are indicated in the HPI above.  All other systems are negative.  Review of Systems  Constitutional:  Negative for fatigue and fever.  HENT:  Negative for congestion.    Respiratory:  Negative for cough and shortness of breath.   Gastrointestinal:  Positive for abdominal pain. Negative for diarrhea, nausea and vomiting.    Vital Signs: BP (!) 141/75   Pulse 74   Temp (!) 97.4 F (36.3 C) (Oral)   Resp 16   Ht 5' 4.5 (1.638 m)   Wt 194 lb (88 kg)   SpO2 97%   BMI 32.79 kg/m   Physical Exam Vitals and nursing note reviewed.  Constitutional:      Appearance: She is well-developed.  HENT:     Head: Normocephalic and atraumatic.     Mouth/Throat:     Mouth: Mucous membranes are dry.  Eyes:  Conjunctiva/sclera: Conjunctivae normal.  Cardiovascular:     Rate and Rhythm: Normal rate.  Pulmonary:     Effort: Pulmonary effort is normal.  Musculoskeletal:        General: Normal range of motion.     Cervical back: Normal range of motion.  Skin:    General: Skin is warm and dry.  Neurological:     General: No focal deficit present.     Mental Status: She is alert and oriented to person, place, and time. Mental status is at baseline.  Psychiatric:        Mood and Affect: Mood normal.        Behavior: Behavior normal.        Thought Content: Thought content normal.        Judgment: Judgment normal.          Imaging: CT ABDOMEN PELVIS W CONTRAST Result Date: 05/24/2024 EXAM: CT ABDOMEN AND PELVIS WITH CONTRAST 05/24/2024 02:51:28 PM TECHNIQUE: CT of the abdomen and pelvis was performed with the administration of 100 mL of iohexol  (OMNIPAQUE ) 300 MG/ML solution. Multiplanar reformatted images are provided for review. Automated exposure control, iterative reconstruction, and/or weight-based adjustment of the mA/kV was utilized to reduce the radiation dose to as low as reasonably achievable. COMPARISON: 08/14/2020 CLINICAL HISTORY: Generalized abdominal pain, weight gain, bloating. Bloating since July 2025. Cramping abdominal pain. Diarrhea, constipation. Hiatal hernia repair. FINDINGS: LOWER CHEST: Stable 6 mm pulmonary nodule in the anterior  right lower lobe compared to previous chest CT on 02/04/2020, consistent with benign etiology. LIVER: Small benign-appearing hepatic cyst. No suspicious hepatic masses. GALLBLADDER AND BILE DUCTS: Gallstones are present, but without signs of cholecystitis. No biliary ductal dilatation. SPLEEN: No acute abnormality. PANCREAS: No acute abnormality. ADRENAL GLANDS: No acute abnormality. KIDNEYS, URETERS AND BLADDER: No stones in the kidneys or ureters. No hydronephrosis. No perinephric or periureteral stranding. Urinary bladder is unremarkable. GI AND BOWEL: Stomach demonstrates no acute abnormality. There is no bowel obstruction. PERITONEUM AND RETROPERITONEUM: No ascites. No free air. VASCULATURE: Aorta is normal in caliber. LYMPH NODES: New right external iliac chain lymphadenopathy with a 1.4 cm lymph node on image 69/2 and a 1.1 cm lymph node on image 72/2. No other sites of lymphadenopathy identified. REPRODUCTIVE ORGANS: No acute abnormality. BONES AND SOFT TISSUES: No acute osseous abnormality. No focal soft tissue abnormality. IMPRESSION: 1. New pelvic lymphadenopathy in the right external iliac chain. No abdominal lymphadenopathy identified. Differential diagnosis includes lymphoproliferative disorder metastatic disease, and infectious/inflammatory etiologies. Consider PET CT, 61-month follow up by CT, or tissue sampling. 2. Cholelithiasis without evidence of cholecystitis. Electronically signed by: Norleen Kil MD 05/24/2024 08:50 PM EDT RP Workstation: [id number]    Labs:  CBC: Recent Labs    04/25/24 1609 06/12/24 0917  WBC 6.9 5.6  HGB 14.0 13.5  HCT 42.8 40.0  PLT 336 270    COAGS: Recent Labs    06/12/24 0917  INR 1.0    BMP: Recent Labs    04/25/24 1609  NA 143  K 3.2*  CL 101  CO2 25  GLUCOSE 86  BUN 20  CALCIUM 9.6  CREATININE 1.09*    LIVER FUNCTION TESTS: Recent Labs    04/25/24 1609  BILITOT 0.4  AST 16  ALT 15  ALKPHOS 98  PROT 7.8  ALBUMIN 4.7     TUMOR MARKERS: No results for input(s): AFPTM, CEA, CA199, CHROMGRNA in the last 8760 hours.  Assessment and Plan:  68 y/o F with recently noted bloating,  change in bowel habits, weight gain and lower extremity swelling  found to have right iliac chain lymphadenopathy on recent CT who presents today for right iliac lymph node biopsy to further direct care.  Risks and benefits of right iliac lymph node biopsy was discussed with the patient and/or patient's family including, but not limited to bleeding, infection, damage to adjacent structures or low yield requiring additional tests.  All of the questions were answered and there is agreement to proceed.  Consent signed and in chart.   Thank you for this interesting consult.  I greatly enjoyed meeting Hannah Short and look forward to participating in their care.  A copy of this report was sent to the requesting provider on this date.  Electronically Signed: Delon JAYSON Beagle, NP 06/12/2024, 9:55 AM   I spent a total of 30 Minutes in face to face in clinical consultation, greater than 50% of which was counseling/coordinating care for lymphadenopathy.

## 2024-06-12 NOTE — Procedures (Signed)
 Interventional Radiology Procedure Note  Procedure: US  Guided Biopsy of right distal external iliac/high inguinal lymph node  Complications: None  Estimated Blood Loss: < 10 mL  Findings: 18 G core biopsy of right external iliac/inguinal LN performed under US  guidance.  Four core samples obtained and sent to Pathology.  Marcey DASEN. Luverne, M.D Pager:  [phone]

## 2024-06-13 ENCOUNTER — Telehealth: Payer: Self-pay | Admitting: Internal Medicine

## 2024-06-13 ENCOUNTER — Other Ambulatory Visit: Payer: Self-pay

## 2024-06-13 DIAGNOSIS — C801 Malignant (primary) neoplasm, unspecified: Secondary | ICD-10-CM

## 2024-06-13 NOTE — Telephone Encounter (Signed)
 Patient requesting f/u call in regards to results please advise.

## 2024-06-13 NOTE — Telephone Encounter (Signed)
 Pt called and wanted to know if she needed to schedule an appt for the lymph node bx. Let her know once the results come back and Dr. Albertus has reviewed them we will let her know. She verbalized understanding.

## 2024-06-15 NOTE — Telephone Encounter (Signed)
 Patient called stating that her results were back in her mychart and is requesting to speak with someone regarding the results. She states it mentions Metastatic carcinoma and does not feel comfortable going all weekend with out full understand of the results. Please advise.

## 2024-06-15 NOTE — Telephone Encounter (Signed)
 Dr. Albertus, I called Marval Epps and explained that you were not in the office today therefore I returned her call. 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  No family history of GYN cancer, father had colon cancer and 2 sisters had breast cancer.  She questioned what the next step was, and I informed her that you would contact the patient to further discuss expediting oncology evaluation after you return to the office early next week. She was appreciative of the call.

## 2024-06-15 NOTE — Telephone Encounter (Signed)
 See note from pt below. Calling regarding lymph node biopsy.

## 2024-06-16 ENCOUNTER — Other Ambulatory Visit: Payer: Self-pay | Admitting: Allergy & Immunology

## 2024-06-19 ENCOUNTER — Ambulatory Visit: Payer: Self-pay | Admitting: Internal Medicine

## 2024-06-19 ENCOUNTER — Encounter: Payer: Self-pay | Admitting: Oncology

## 2024-06-19 NOTE — Progress Notes (Signed)
 Called [facility] for clarification of the liver cysts on the CT AP from 05/24/2024.  Also sent an email to [facility] to clarify WT mentioned in the comments on accession [id number].

## 2024-06-19 NOTE — Telephone Encounter (Signed)
 An urgent referral to oncology has been placed electronically in EPIC.

## 2024-06-19 NOTE — Telephone Encounter (Signed)
 I had a conversation with Hannah Short by phone. 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 I explained to her that oncology typically orders the PET scan to ensure that it is covered by her insurance; this may be before her oncology visit but will defer that decision to oncology  Time provided for questions and answers and she thanked me for the call  I let her know to reach out to me in any point through this process if I can be helpful.  Please expedite oncology evaluation

## 2024-06-19 NOTE — Addendum Note (Signed)
 Addended by: CLAUDENE NAOMIE SAILOR on: 06/19/2024 09:52 AM   Modules accepted: Orders

## 2024-06-19 NOTE — Patient Instructions (Incomplete)
 Asthma Continue budesonide  0.5 mg twice a day via nebulizer to control asthma. Continue albuterol 2 puffs once every 4 hours as needed for cough or wheeze Get a chest xray. We will call you when the results become available Follow up with your cardiologist for dyspnea on exertion Go to the ED or call the clinic for any increase in shortness of breath  Rash Continue a daily moisturizing routine Begin Eucrisa to red and itchy areas up to twice a day if needed for itch Continue clobetasol  to red and itchy areas under your face up to twice a day if needed. Do not use this medication longet than 2 weeks in a row Consider patch testing when your breathing is more controlled  Abdominal pain Follow up with your GI specialist for evaluation of abdominal pain and trouble swallowing  Abnormal labs Follow up with rheumatology for further evaluation of ANA Follow up with your primary care provider for follow up renal function lab and possible thyroid  function testing since you have elevated thyroid  antibodies  Call the clinic if this treatment plan is not working well for you  Follow up in the clinic 2 weeks or sooner if needed.

## 2024-06-19 NOTE — Progress Notes (Addendum)
 [address] Hannah Short [place] [address] Dept: [phone]  FOLLOW UP NOTE  Patient ID: Hannah Short, female    DOB: [birth year]  Age: 68 y.o. MRN: [id number] Date of Office Visit: 06/20/2024  Assessment  Chief Complaint: Follow-up (Breathing better (taking potassium but not sure of dose))  HPI Hannah Short is a 68 year old female who presents to the clinic for a follow up visit. She was last seen in this clinic on 05/23/2024 by Dr. Iva for evaluation of asthma rash, and abdominal bloating. 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  Patient has been referred to oncology for further evaluation and treatment  Discussed the use of AI scribe software for clinical note transcription with the patient, who gave verbal consent to proceed.  History of Present Illness Hannah Short Hannah Short is a 68 year old female with asthma who presents with breathing difficulties and a persistent rash.  She experiences breathing difficulties characterized by pressure and tightness in her chest, particularly after physical activities such as sweeping the floor. Her breathing has improved slightly with medication, but she still struggles, especially when pulling up her pants. She uses budesonide  and formoterol  twice daily, which she feels helps her breathing.   She has a persistent rash on both arms since March2025, described as a red areas with small clear fluid-filled lumps. A dermatologist performed a biopsy and discussed possible causes for the rash, including eczema and autoimmune response disorder, as relayed by the patient. Despite alternating between two prescribed treatments, ZORYVE and a topical steroid treatment, the rash has not improved.  We did discuss patch testing benefits and risks.  She mentions significant weight gain and  swelling in her legs, attributed to fluid retention. She has a history of ulcerative colitis and is cautious about using diuretics due to past adverse effects. She experiences pain in her lower back and abdomen, which worsens if she delays bowel movements.  She reports nasal congestion and drainage, which she attributes to environmental factors such as sleeping with a window open and a fan on. She has not been using Flonase recently but has used it in the past. She experiences clear nasal discharge and drainage in the back of her throat.  Her current medications are listed in the chart.  Chart review:     Component Ref Range & Units (hover) 9 d ago  SURGICAL PATHOLOGY SURGICAL PATHOLOGY CASE: [id number] PATIENT: Hannah Short Surgical Pathology Report     Clinical History: incidental enlarged lymph node right external iliac/upper inguinal lymph node by CT (cm)     FINAL MICROSCOPIC DIAGNOSIS:  A. 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      See comment.  COMMENT:  The specimen shows lymphoid tissue involved by a high-grade malignancy forming papillary architecture. Immunohistochemical stains were performed to characterize the tumor cells. The cells are positive for CK7, PAX8, and WT-. The cells are negative for TTF-1, CDX2, and GATA3. P53 is a mutant type of staining / strongly and diffusely positive. 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  Controls worked appropriately.  The case was peer-reviewed by Dr. Belvie who agrees with the diagnosis of metastatic carcinoma. Dr. Albertus was notified via Epic messaging at 10:23  am on 06/15/2024.   GROSS DESCRIPTION:  The specimen is received in saline and consists of multiple core and core fragments of tan-red soft tissue,  ranging from 0.1 to 2.7 cm in length by 0.1 cm in diameter.  Representative portion of tissue is placed in RPMI for possible flow cytometry, the remaining tissue is entirely submitted in 1 cassette.  SHIRLEEN 06/12/2024)  Final Diagnosis performed by Pepper Dutton, MD.   Electronically signed 06/15/2024 Technical and / or Professional components performed at [facility]. [facility], [address], [place], [place] [address].  Immunohistochemistry Technical component (if applicable) was performed at [facility]. [address], [address], [place], [place] [address].   IMMUNOHISTOCHEMISTRY DISCLAIMER (if applicable): Some of these immunohistochemical stains may have been developed and the performance characteristics determine by [facility]. Some may not have been cleared or approved by the U.S. Food and Drug Administration. The FDA has determined that such clearance or approval is not necessary. This test is used for clinical purposes. It should not be regarded as investigational or for research. This laboratory is certified under the Clinical Laboratory Improvement Amendments of 1988 (CLIA-88) as qualified to perform high complexity clinical laboratory testing.  The controls stained appropriately.   IHC stains are performed on formalin fixed, paraffin embedded tissue using a 3,3diaminobenzidine (DAB) chromogen and Leica Bond Autostainer System. The staining intensity of the nucleus is score manually and is reported as the percentage of tumor cell nuclei demonstrating specific nuclear staining. The specimens are fixed in 10% Neutral Formalin for at least 6 hours and up to 72hrs. These tests are validated on decalcified tissue. Results should be interpreted with caution given the possibility of false negative results on decalcified specimens. Antibody Clones are as follows ER-clone 66F, PR-clone 16, Ki67- clone MM1. Some of these immunohistochemical stains may  have been developed and the performance characteristics determined by [facility].      Drug Allergies:  Allergies  Allergen Reactions   Topamax [Topiramate] Other (See Comments)    Pt.states makes legs go numb   Betanidine [Bethanidine]    Clonazepam     BALANCE ISSUE   Flagyl [Metronidazole]    Sulfa Antibiotics    Betadine [Povidone Iodine] Rash    Physical Exam: BP (!) 140/86 (BP Location: Left Arm, Patient Position: Sitting)   Pulse 70   Wt 194 lb 6.4 oz (88.2 kg)   SpO2 95%   BMI 32.85 kg/m    Physical Exam Vitals reviewed.  Constitutional:      Appearance: Normal appearance.  HENT:     Head: Normocephalic and atraumatic.     Right Ear: Tympanic membrane normal.     Left Ear: Tympanic membrane normal.     Nose:     Comments: Bilateral nares normal.  Pharynx normal.  Ears normal.  Eyes normal.    Mouth/Throat:     Pharynx: Oropharynx is clear.  Eyes:     Conjunctiva/sclera: Conjunctivae normal.  Cardiovascular:     Rate and Rhythm: Normal rate and regular rhythm.     Heart sounds: Normal heart sounds. No murmur heard. Pulmonary:     Effort: Pulmonary effort is normal.     Breath sounds: Normal breath sounds.     Comments: Lungs clear to auscultation Abdominal:     General: There is distension.  Musculoskeletal:        General: Normal range of motion.     Cervical back: Normal range of motion and neck  supple.  Skin:    General: Skin is warm and dry.  Neurological:     Mental Status: She is alert and oriented to person, place, and time.  Psychiatric:        Mood and Affect: Mood normal.        Behavior: Behavior normal.        Thought Content: Thought content normal.        Judgment: Judgment normal.      Assessment and Plan: 1. Mild persistent asthma without complication   2. Rash and nonspecific skin eruption     Patient Instructions  Asthma Continue budesonide  0.5 mg twice a day via nebulizer to control asthma. Continue albuterol   2 puffs once every 4 hours as needed for cough or wheeze Get a chest xray. We will call you when the results become available Follow up with your cardiologist for dyspnea on exertion  Rash Continue a daily moisturizing routine Continue clobetasol  to red and itchy areas under your face up to twice a day if needed. Do not use this medication longet than 2 weeks in a row Continue to follow up with your dermatologist as recommended Consider patch testing when your breathing is more controlled  Follow up with your oncologist as recommended  Call the clinic if this treatment plan is not working well for you  Follow up in the clinic 3 months or sooner if needed.  Return in about 3 months (around 09/20/2024), or if symptoms worsen or fail to improve.    Thank you for the opportunity to care for this patient.  Please do not hesitate to contact me with questions.  Arlean Mutter, FNP [facility] 

## 2024-06-20 ENCOUNTER — Ambulatory Visit (HOSPITAL_COMMUNITY)
Admission: RE | Admit: 2024-06-20 | Discharge: 2024-06-20 | Disposition: A | Discharge status: Home / Self Care | Payer: PRIVATE HEALTH INSURANCE | Source: Ambulatory Visit | Attending: Cardiology | Admitting: Cardiology

## 2024-06-20 ENCOUNTER — Telehealth: Payer: Self-pay | Admitting: Oncology

## 2024-06-20 ENCOUNTER — Encounter: Payer: Self-pay | Admitting: Family Medicine

## 2024-06-20 ENCOUNTER — Ambulatory Visit: Admitting: Family Medicine

## 2024-06-20 ENCOUNTER — Encounter: Payer: Self-pay | Admitting: Psychiatry

## 2024-06-20 VITALS — BP 140/86 | HR 70 | Wt 194.4 lb

## 2024-06-20 DIAGNOSIS — J4530 Mild persistent asthma, uncomplicated: Secondary | ICD-10-CM | POA: Diagnosis not present

## 2024-06-20 DIAGNOSIS — R21 Rash and other nonspecific skin eruption: Secondary | ICD-10-CM | POA: Diagnosis not present

## 2024-06-20 DIAGNOSIS — I10 Essential (primary) hypertension: Secondary | ICD-10-CM | POA: Insufficient documentation

## 2024-06-20 DIAGNOSIS — R0789 Other chest pain: Secondary | ICD-10-CM | POA: Diagnosis not present

## 2024-06-20 DIAGNOSIS — R0602 Shortness of breath: Secondary | ICD-10-CM | POA: Insufficient documentation

## 2024-06-20 LAB — ECHOCARDIOGRAM COMPLETE
Area-P 1/2: 2.7 cm2
S' Lateral: 2.9 cm
Weight: 3110.4 [oz_av]

## 2024-06-20 NOTE — Telephone Encounter (Signed)
 Left a message regarding appointment with Dr. Eldonna on 06/25/24 at 9:45.  Requested a return call to confirm.

## 2024-06-21 ENCOUNTER — Encounter: Payer: Self-pay | Admitting: Family Medicine

## 2024-06-21 ENCOUNTER — Telehealth: Payer: Self-pay | Admitting: Oncology

## 2024-06-21 ENCOUNTER — Ambulatory Visit: Payer: Self-pay | Admitting: Cardiovascular Disease

## 2024-06-21 ENCOUNTER — Ambulatory Visit (HOSPITAL_COMMUNITY)
Admission: RE | Admit: 2024-06-21 | Discharge: 2024-06-21 | Disposition: A | Discharge status: Home / Self Care | Source: Ambulatory Visit | Attending: Family Medicine | Admitting: Family Medicine

## 2024-06-21 DIAGNOSIS — R0602 Shortness of breath: Secondary | ICD-10-CM | POA: Diagnosis present

## 2024-06-21 NOTE — Telephone Encounter (Signed)
 Called Hannah Short back and let her know we do not have any openings tomorrow.  Recommended that she contact Dr. Pamula office for recommendations since we have not seen her yet.  Also advised she can to the ER if her symptoms worsen.  Hannah Short verbalized understanding and also has an appointment with her PCP tomorrow.

## 2024-06-21 NOTE — Telephone Encounter (Signed)
 Hannah Short called and reported having more bloating in her abdomen yesterday after her echo.  She said she looked 9 months pregnant.  The bloating is better this morning but she is also having pain in her left groin area at a 4/10.  She is wondering if she can be seen tomorrow instead of Monday.  Advised I will find out and call her back.

## 2024-06-22 ENCOUNTER — Other Ambulatory Visit: Payer: Self-pay

## 2024-06-22 ENCOUNTER — Ambulatory Visit: Payer: Self-pay | Admitting: Family Medicine

## 2024-06-22 LAB — SURGICAL PATHOLOGY

## 2024-06-22 NOTE — Addendum Note (Signed)
 Addended by: MARCINE ISAIAH CROME on: 06/22/2024 03:10 PM   Modules accepted: Orders

## 2024-06-22 NOTE — Progress Notes (Signed)
 Discussed results with patient. Lab work has been entered and all questions answered

## 2024-06-23 LAB — ALPHA-1-ANTITRYPSIN: A-1 Antitrypsin: 134 mg/dL (ref 101–187)

## 2024-06-25 ENCOUNTER — Inpatient Hospital Stay

## 2024-06-25 ENCOUNTER — Inpatient Hospital Stay: Attending: Psychiatry | Admitting: Psychiatry

## 2024-06-25 ENCOUNTER — Inpatient Hospital Stay: Admitting: Gynecologic Oncology

## 2024-06-25 ENCOUNTER — Encounter: Payer: Self-pay | Admitting: Psychiatry

## 2024-06-25 ENCOUNTER — Encounter: Payer: Self-pay | Admitting: Oncology

## 2024-06-25 ENCOUNTER — Telehealth (HOSPITAL_BASED_OUTPATIENT_CLINIC_OR_DEPARTMENT_OTHER): Payer: Self-pay

## 2024-06-25 ENCOUNTER — Telehealth: Payer: Self-pay | Admitting: *Deleted

## 2024-06-25 VITALS — BP 135/61 | HR 74 | Temp 97.9°F | Resp 18 | Ht 63.0 in | Wt 195.4 lb

## 2024-06-25 DIAGNOSIS — Z8041 Family history of malignant neoplasm of ovary: Secondary | ICD-10-CM

## 2024-06-25 DIAGNOSIS — C579 Malignant neoplasm of female genital organ, unspecified: Secondary | ICD-10-CM

## 2024-06-25 DIAGNOSIS — C5700 Malignant neoplasm of unspecified fallopian tube: Secondary | ICD-10-CM

## 2024-06-25 DIAGNOSIS — R0789 Other chest pain: Secondary | ICD-10-CM

## 2024-06-25 DIAGNOSIS — R0602 Shortness of breath: Secondary | ICD-10-CM

## 2024-06-25 DIAGNOSIS — J4530 Mild persistent asthma, uncomplicated: Secondary | ICD-10-CM

## 2024-06-25 MED ORDER — SENNOSIDES-DOCUSATE SODIUM 8.6-50 MG PO TABS: 2.0000 | ORAL_TABLET | Freq: Every day | ORAL | 0 refills | Status: AC

## 2024-06-25 MED ORDER — OXYCODONE HCL 5 MG PO TABS: 5.0000 mg | ORAL_TABLET | ORAL | 0 refills | Status: DC | PRN

## 2024-06-25 NOTE — Telephone Encounter (Signed)
 Patient scheduled for pre-op clearance on 07/04/24 with Damien Braver, NP     Patient Consent for Virtual Visit        ADELENA DESANTIAGO has provided verbal consent on 06/25/2024 for a virtual visit (video or telephone).   CONSENT FOR VIRTUAL VISIT FOR:  [name]  By participating in this virtual visit I agree to the following:  I hereby voluntarily request, consent and authorize [facility] and its employed or contracted physicians, physician assistants, nurse practitioners or other licensed health care professionals (the Practitioner), to provide me with telemedicine health care services (the "Services) as deemed necessary by the treating Practitioner. I acknowledge and consent to receive the Services by the Practitioner via telemedicine. I understand that the telemedicine visit will involve communicating with the Practitioner through live audiovisual communication technology and the disclosure of certain medical information by electronic transmission. I acknowledge that I have been given the opportunity to request an in-person assessment or other available alternative prior to the telemedicine visit and am voluntarily participating in the telemedicine visit.  I understand that I have the right to withhold or withdraw my consent to the use of telemedicine in the course of my care at any time, without affecting my right to future care or treatment, and that the Practitioner or I may terminate the telemedicine visit at any time. I understand that I have the right to inspect all information obtained and/or recorded in the course of the telemedicine visit and may receive copies of available information for a reasonable fee.  I understand that some of the potential risks of receiving the Services via telemedicine include:  Delay or interruption in medical evaluation due to technological equipment failure or disruption; Information transmitted may not be sufficient (e.g. poor resolution of  images) to allow for appropriate medical decision making by the Practitioner; and/or  In rare instances, security protocols could fail, causing a breach of personal health information.  Furthermore, I acknowledge that it is my responsibility to provide information about my medical history, conditions and care that is complete and accurate to the best of my ability. I acknowledge that Practitioner's advice, recommendations, and/or decision may be based on factors not within their control, such as incomplete or inaccurate data provided by me or distortions of diagnostic images or specimens that may result from electronic transmissions. I understand that the practice of medicine is not an exact science and that Practitioner makes no warranties or guarantees regarding treatment outcomes. I acknowledge that a copy of this consent can be made available to me via my patient portal [facility] MyChart), or I can request a printed copy by calling the office of [facility].    I understand that my insurance will be billed for this visit.   I have read or had this consent read to me. I understand the contents of this consent, which adequately explains the benefits and risks of the Services being provided via telemedicine.  I have been provided ample opportunity to ask questions regarding this consent and the Services and have had my questions answered to my satisfaction. I give my informed consent for the services to be provided through the use of telemedicine in my medical care

## 2024-06-25 NOTE — Patient Instructions (Signed)
 Dr. Eldonna has placed an order for you to meet with the genetics counselor.  Plan to have lab work today and you will be contacted with the results.   Preparing for your Surgery  Plan for surgery on July 17, 2024 with Dr. Hoy Eldonna at [facility]. You will be scheduled for robotic assisted total laparoscopic hysterectomy (removal of the uterus and cervix), bilateral salpingo-oophorectomy (removal of both ovaries and fallopian tubes), lymph node dissection, omentectomy (removal of the omentum) through a mini laparotomy (slightly larger incision), possible laparotomy (larger incision on your abdomen if needed).  Pre-operative Testing -You will receive a phone call from presurgical testing at [facility] to arrange for a pre-operative appointment and lab work.  -Bring your insurance card, copy of an advanced directive if applicable, medication list  -At that visit, you will be asked to sign a consent for a possible blood transfusion in case a transfusion becomes necessary during surgery.  The need for a blood transfusion is rare but having consent is a necessary part of your care.     -You should not be taking blood thinners or aspirin at least ten days prior to surgery unless instructed by your surgeon.  -Do not take supplements such as fish oil (omega 3), red yeast rice, turmeric before your surgery. STOP TAKING AT LEAST 10 DAYS BEFORE SURGERY. You want to avoid medications with aspirin in them including headache powders such as BC or Goody's), Excedrin migraine.  -If you are taking a GLP-1 medication/injection such as Ozempic, Mounjaro, [id number], this needs to be held before surgery for at least 7 days before.  Day Before Surgery at Home -You will be asked to take in a light diet the day before surgery. You will be advised you can have clear liquids up until 3 hours before your surgery.    Eat a light diet the day before surgery.  Examples including soups, broths,  toast, yogurt, mashed potatoes.  AVOID GAS PRODUCING FOODS AND BEVERAGES. Things to avoid include carbonated beverages (fizzy beverages, sodas), raw fruits and raw vegetables (uncooked), or beans.   If your bowels are filled with gas, your surgeon will have difficulty visualizing your pelvic organs which increases your surgical risks.  Your role in recovery Your role is to become active as soon as directed by your doctor, while still giving yourself time to heal.  Rest when you feel tired. You will be asked to do the following in order to speed your recovery:  - Cough and breathe deeply. This helps to clear and expand your lungs and can prevent pneumonia after surgery.  - STAY ACTIVE WHEN YOU GET HOME. Do mild physical activity. Walking or moving your legs help your circulation and body functions return to normal. Do not try to get up or walk alone the first time after surgery.   -If you develop swelling on one leg or the other, pain in the back of your leg, redness/warmth in one of your legs, please call the office or go to the Emergency Room to have a doppler to rule out a blood clot. For shortness of breath, chest pain-seek care in the Emergency Room as soon as possible. - Actively manage your pain. Managing your pain lets you move in comfort. We will ask you to rate your pain on a scale of zero to 10. It is your responsibility to tell your doctor or nurse where and how much you hurt so your pain can be treated.  Special  Considerations -If you are diabetic, you may be placed on insulin  after surgery to have closer control over your blood sugars to promote healing and recovery.  This does not mean that you will be discharged on insulin .  If applicable, your oral antidiabetics will be resumed when you are tolerating a solid diet.  -Your final pathology results from surgery should be available around one week after surgery and the results will be relayed to you when available.  -Dr. Olam Mill is the surgeon that assists your GYN Oncologist with surgery.  If you end up staying the night, the next day after your surgery you will either see Dr. Viktoria, Dr. Eldonna, or Dr. Olam Mill.  -FMLA forms can be faxed to [phone] and please allow 5-7 business days for completion.  Pain Management After Surgery -You will be prescribed your pain medication and bowel regimen medications before surgery so that you can have these available when you are discharged from the hospital. The pain medication is for use ONLY AFTER surgery and a new prescription will not be given.   -Make sure that you have Tylenol and Ibuprofen IF YOU ARE ABLE TO TAKE THESE MEDICATIONS at home to use on a regular basis after surgery for pain control. We recommend alternating the medications every hour to six hours since they work differently and are processed in the body differently for pain relief.  -Review the attached handout on narcotic use and their risks and side effects.   Bowel Regimen -You will be prescribed Sennakot-S to take nightly to prevent constipation especially if you are taking the narcotic pain medication intermittently.  It is important to prevent constipation and drink adequate amounts of liquids. You can stop taking this medication when you are not taking pain medication and you are back on your normal bowel routine.  Risks of Surgery Risks of surgery are low but include bleeding, infection, damage to surrounding structures, re-operation, blood clots, and very rarely death.   Blood Transfusion Information (For the consent to be signed before surgery)  We will be checking your blood type before surgery so in case of emergencies, we will know what type of blood you would need.                                            WHAT IS A BLOOD TRANSFUSION?  A transfusion is the replacement of blood or some of its parts. Blood is made up of multiple cells which provide different  functions. Red blood cells carry oxygen and are used for blood loss replacement. White blood cells fight against infection. Platelets control bleeding. Plasma helps clot blood. Other blood products are available for specialized needs, such as hemophilia or other clotting disorders. BEFORE THE TRANSFUSION  Who gives blood for transfusions?  You may be able to donate blood to be used at a later date on yourself (autologous donation). Relatives can be asked to donate blood. This is generally not any safer than if you have received blood from a stranger. The same precautions are taken to ensure safety when a relative's blood is donated. Healthy volunteers who are fully evaluated to make sure their blood is safe. This is blood bank blood. Transfusion therapy is the safest it has ever been in the practice of medicine. Before blood is taken from a donor, a complete history is taken to make sure  that person has no history of diseases nor engages in risky social behavior (examples are intravenous drug use or sexual activity with multiple partners). The donor's travel history is screened to minimize risk of transmitting infections, such as malaria. The donated blood is tested for signs of infectious diseases, such as HIV and hepatitis. The blood is then tested to be sure it is compatible with you in order to minimize the chance of a transfusion reaction. If you or a relative donates blood, this is often done in anticipation of surgery and is not appropriate for emergency situations. It takes many days to process the donated blood. RISKS AND COMPLICATIONS Although transfusion therapy is very safe and saves many lives, the main dangers of transfusion include:  Getting an infectious disease. Developing a transfusion reaction. This is an allergic reaction to something in the blood you were given. Every precaution is taken to prevent this. The decision to have a blood transfusion has been considered carefully by  your caregiver before blood is given. Blood is not given unless the benefits outweigh the risks.  AFTER SURGERY INSTRUCTIONS  Return to work: 4-6 weeks if applicable  Activity: 1. Be up and out of the bed during the day.  Take a nap if needed.  You may walk up steps but be careful and use the hand rail.  Stair climbing will tire you more than you think, you may need to stop part way and rest.   2. No lifting or straining for 6 weeks over 10 pounds. No pushing, pulling, straining for 6 weeks.  3. No driving for 4-89 days when the following criteria have been met: Do not drive if you are taking narcotic pain medicine and make sure that your reaction time has returned.   4. You can shower as soon as the next day after surgery. Shower daily.  Use your regular soap and water (not directly on the incision) and pat your incision(s) dry afterwards; don't rub.  No tub baths or submerging your body in water until cleared by your surgeon. If you have the soap that was given to you by pre-surgical testing that was used before surgery, you do not need to use it afterwards because this can irritate your incisions.   5. No sexual activity and nothing in the vagina for 12 weeks.  6. You may experience a small amount of clear drainage from your incisions, which is normal.  If the drainage persists, increases, or changes color please call the office.  7. Do not use creams, lotions, or ointments such as neosporin on your incisions after surgery until advised by your surgeon because they can cause removal of the dermabond glue on your incisions.    8. You may experience vaginal spotting after surgery or when the stitches at the top of the vagina begin to dissolve.  The spotting is normal but if you experience heavy bleeding, call our office.  9. Take Tylenol or ibuprofen first for pain if you are able to take these medications and only use narcotic pain medication for severe pain not relieved by the Tylenol or  Ibuprofen.  Monitor your Tylenol intake to a max of 4,000 mg in a 24 hour period. You can alternate these medications after surgery.  Diet: 1. Low sodium Heart Healthy Diet is recommended but you are cleared to resume your normal (before surgery) diet after your procedure.  2. It is safe to use a laxative, such as Miralax or Colace, if you have difficulty  moving your bowels before surgery. You have been prescribed Sennakot-S to take at bedtime every evening after surgery to keep bowel movements regular and to prevent constipation.    Wound Care: 1. Keep clean and dry.  Shower daily.  Reasons to call the Doctor: Fever - Oral temperature greater than 100.4 degrees Fahrenheit Foul-smelling vaginal discharge Difficulty urinating Nausea and vomiting Increased pain at the site of the incision that is unrelieved with pain medicine. Difficulty breathing with or without chest pain New calf pain especially if only on one side Sudden, continuing increased vaginal bleeding with or without clots.   Contacts: For questions or concerns you should contact:  Dr. Hoy Masters at [phone]  [name], NP at [phone]  After Hours: call [phone] and have the GYN Oncologist paged/contacted (after 5 pm or on the weekends). You will speak with an after hours RN and let he or she know you have had surgery.  Messages sent via mychart are for non-urgent matters and are not responded to after hours so for urgent needs, please call the after hours number.

## 2024-06-25 NOTE — Telephone Encounter (Signed)
 Per Dr Eldonna fax surgical optimization form to the patient's Cardiology office (Dr Court [phone]) and Allergist office [phone])

## 2024-06-25 NOTE — Progress Notes (Signed)
 GYNECOLOGIC ONCOLOGY NEW PATIENT CONSULTATION  Date of Service: 06/25/2024 Referring Provider: Gordy Starch, MD [facility])   ASSESSMENT AND PLAN: Hannah Short is a 68 y.o. 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  Reviewed her workup to date.  Isolated pelvic lymphadenopathy with no other intraperitoneal findings noted on CT scan.  Biopsy with histology and IHC most consistent with high-grade serous carcinoma of GYN origin.  Reviewed with patient that I suspect this may have arisen from her fallopian tube based on the fact that there is no clear pelvic mass noted on imaging.  Given no other clear evidence of disease on imaging, feel that patient is an appropriate candidate for primary debulking surgery.  Reviewed that we could initiate this with a minimally invasive approach.  Patient may require laparotomy pending findings intraoperatively or based on her adhesive disease.  The patient does have a history of prior ventral hernia repairs with mesh and reviewed that she may be at increased risk of injury to bowel due to possible adhesions.  Patient has already had CT abdomen/pelvis as well as a CT chest without otherwise evidence of metastatic disease beyond the lymph node disease.  Will obtain a CA125 today for baseline.  Patient was consented for: Robotic assisted total laparoscopic hysterectomy, bilateral salpingo-oophorectomy, tumor debulking with pelvic lymphadenectomy, omentectomy, possible laparotomy on 07/17/2024. Reviewed we would also remove any tissue concerning for metastatic disease which could require additional procedures including bowel surgery, but no particular concern for bowel involvement based on imaging alone.  The risks of surgery were discussed in detail and she understands these to including but not limited to bleeding requiring a blood transfusion, infection, injury to adjacent organs (including but not  limited to the bowels, bladder, ureters, nerves, blood vessels), thromboembolic events, wound separation, hernia, vaginal cuff separation, possible risk of lymphedema and lymphocyst if lymphadenectomy performed, unforseen complication, possible need for re-exploration, medical complications such as heart attack, stroke, pneumonia, and possible permanent ostomy.  If the patient experiences any of these events, she understands that her hospitalization or recovery may be prolonged and that she may need to take additional medications for a prolonged period. The patient will receive DVT and antibiotic prophylaxis as indicated. She voiced a clear understanding. She had the opportunity to ask questions and informed consent was obtained today. She wishes to proceed.  She will proceed to the lab today for CA125. Given her report of shortness of breath (although no increased work of breathing or shortness of breath on exam today) will obtain pulmonary and cardiac clearance prior to surgery.  She is currently established with both pulmonology and cardiology with recent echo on 06/20/2024. We will plan for overnight admission unless indicated longer based on route of surgery.  Plan for 2-week postop chemoprophylaxis if minimally invasive in 4 weeks chemoprophylaxis if laparotomy. All preoperative instructions were reviewed. Postoperative expectations were also reviewed. Written handouts were provided to the patient.  Given her personal diagnosis of high-grade serous carcinoma as well as a family history of half-siblings with breast and ovarian cancer, referral to genetics placed.  A copy of this note was sent to the patient's referring provider.  Hoy Masters, MD Gynecologic Oncology   Medical Decision Making I personally spent  TOTAL 80 minutes face-to-face and non-face-to-face in the care of this patient, which includes all pre, intra, and post visit time on the date of service.   ------------  CC:  High-grade serous carcinoma of GYN  origin  HISTORY OF PRESENT ILLNESS:  Hannah Short is a 68 y.o. woman who is seen in consultation at the request of Gordy Starch, MD  for evaluation of high-grade serous carcinoma of GYN origin.  Patient presented to GI on 05/16/2024 for severe bloating and weight gain.  She also noted that about 2 weeks prior her bowel habits have changed and went from mushy bowel 3-4 times a day to straining for stools which are soft and formed.  Also noted to a lot of gas.  She had previously undergone colonoscopy in June 2023 and subsequently in May 2024 with the most recent colonoscopy revealing diverticulosis in the sigmoid and hemorrhoids.  Given her symptoms she underwent a CT abdomen/pelvis on 05/24/2024 which noted new pelvic lymphadenopathy in the right external iliac chain, no ascites no reported abnormality of the reproductive organs.  A subsequent CT chest in 06/04/2024 noted ground glass attenuation in the lungs suggestive of small airway disease and multiple scattered pulmonary nodules, grossly stable to prior, likely benign.  Patient then underwent ultrasound-guided core biopsy of for enlarged lymph nodes on 06/12/2024 which resulted with metastatic carcinoma, favor high-grade serous carcinoma of gynecologic origin with CK7, PAX8 and WT1 positive, p53 mutant  Today patient endorses the history above.  She reports chronic bloating that worsened in October.  She also noted weight gain, change in her bowel movements with pooping on the time after eating and decreased p.o. intake due to feeling full.  Denies any change in her urination.  Also reports upper abdominal pain that she describes as twisting in nature.  Patient otherwise is followed by Marty Shaggy, MD and Arlean Mutter, FNP with [facility] for asthma, last seen on 06/20/2024.  She is on budesonide  nebulizer and albuterol  as needed.  She also follows with Dr. Dorn Lesches, [facility], last  seen on 05/09/24.  She had an echo on 06/20/2024 which showed EF 55-60%, Grade 1 diastolic dysfunction, mild mitral valve regurg.  Patient has a history of prior ventral hernias that were repaired multiple times, most recently in 07/2001 with a laparoscopic repair with mesh.  Patient also reports two paternal half sisters with breast and ovarian cancer, no reported genetic testing.    PAST MEDICAL HISTORY: Past Medical History:  Diagnosis Date   Acute torn meniscus of knee, left, initial encounter 2019   Anemia    Anxiety    Arthritis    Asthma    Back pain    Bulging lumbar disc    Chest pain    Chronically dry eyes, bilateral    Constipation    Depression    Diverticulosis    Fatty liver    Fibromyalgia    Gallstones    GERD (gastroesophageal reflux disease)    Heart murmur    mitral valve prolapse   Hepatic steatosis    Hiatal hernia    Hypertension    Hypokalemia    Internal hemorrhoids    Joint pain    Lactose intolerance    Lipoma of colon    Migraine    Mitral valve prolapse    Neck pain    L3 4 and 5 bulging discs   Shortness of breath    Tubular adenoma of colon     PAST SURGICAL HISTORY: Past Surgical History:  Procedure Laterality Date   CESAREAN SECTION     HERNIA REPAIR  07/2001   LSC ventral hernia repair with mesh (two prior ventral hernia repairs  prior to that)   HYSTEROSCOPY W/ ENDOMETRIAL ABLATION  2005   TOE SURGERY  1990   stepped on toothpick    OB/GYN HISTORY: OB History  Gravida Para Term Preterm AB Living  4 4 4   5   SAB IAB Ectopic Multiple Live Births     1 5    # Outcome Date GA Lbr Len/2nd Weight Sex Type Anes PTL Lv  4 Term      CS-Unspec   LIV  3 Term      Vag-Spont   LIV  2A Term      Vag-Spont   LIV  2B Term      Vag-Spont   LIV  1 Term      Vag-Spont   LIV      Age at menarche: 39 Age at menopause: 36 with ablation (age 48)  Hx of HRT: pills, IUD prior to menopause, nothing postmenopausal Hx of STI: no Last  pap: 2019 NILM, HPV not evaluated History of abnormal pap smears: ?cervix scraped 1996 or 1997. Normal records going back to 2011. No excision  SCREENING STUDIES:  Last mammogram: 2024 Last colonoscopy: 2024  MEDICATIONS:  Current Outpatient Medications:    albuterol  (PROVENTIL ) (2.5 MG/3ML) 0.083% nebulizer solution, Take 3 mLs (2.5 mg total) by nebulization every 4 (four) hours as needed for wheezing or shortness of breath., Disp: 75 mL, Rfl: 1   amLODipine  (NORVASC ) 2.5 MG tablet, TAKE ONE TABLET BY MOUTH ONCE DAILY, Disp: 15 tablet, Rfl: 0   arformoterol  (BROVANA ) 15 MCG/2ML NEBU, Take 2 mLs (15 mcg total) by nebulization 2 (two) times daily., Disp: 120 mL, Rfl: 3   budesonide  (PULMICORT ) 0.5 MG/2ML nebulizer solution, Use one vial 3 times daily as needed during flares., Disp: 120 mL, Rfl: 1   cetirizine (ZYRTEC) 10 MG tablet, Take 10 mg by mouth daily., Disp: , Rfl:    cholecalciferol (VITAMIN D3) 25 MCG (1000 UNIT) tablet, Take 2 tablets (50 mcg total) by mouth daily., Disp: , Rfl:    famotidine (PEPCID) 40 MG tablet, Take 40 mg by mouth daily., Disp: , Rfl:    fexofenadine (ALLEGRA) 180 MG tablet, Take 180 mg by mouth daily as needed., Disp: , Rfl:    fluticasone (FLONASE) 50 MCG/ACT nasal spray, SMARTSIG:2 Spray(s) Both Nares Every Night, Disp: , Rfl:    NON FORMULARY, Apply 1 Application topically daily. Zoryve cream, Disp: , Rfl:    omeprazole (PRILOSEC) 40 MG capsule, Take 40 mg by mouth daily., Disp: , Rfl:    potassium chloride SA (KLOR-CON M) 20 MEQ tablet, Take 20 mEq by mouth daily., Disp: , Rfl:    Simethicone 125 MG CAPS, Take by mouth as needed., Disp: , Rfl:    clobetasol  ointment (TEMOVATE ) 0.05 %, Apply 1 Application topically 2 (two) times daily. Use for two weeks at a time top. (Patient not taking: Reported on 06/20/2024), Disp: 30 g, Rfl: 3   valACYclovir (VALTREX) 500 MG tablet, Take by mouth. (Patient not taking: Reported on 06/20/2024), Disp: , Rfl:    VENTOLIN   HFA 108 (90 Base) MCG/ACT inhaler, SMARTSIG:1-2 Puff(s) Via Inhaler Every 4-6 Hours PRN (Patient not taking: Reported on 06/20/2024), Disp: , Rfl:   ALLERGIES: Allergies  Allergen Reactions   Topamax [Topiramate] Other (See Comments)    Pt.states makes legs go numb   Betanidine [Bethanidine]    Clonazepam     BALANCE ISSUE   Flagyl [Metronidazole]    Sulfa Antibiotics    Betadine [Povidone Iodine] Rash  FAMILY HISTORY: Family History  Problem Relation Age of Onset   Heart failure Mother    Heart failure Father    Colon cancer Father    Hypertension Father    Breast cancer Half-Sister        paternal, did not die from breast cancer   Ovarian cancer Half-Sister    Breast cancer Half-Sister        paternal, did not die from breast cancer   Ovarian cancer Half-Sister    Liver cancer Half-Brother        maternal, died from cancer   Cancer Half-Brother        maternal, died from cancer (unknown type)   Stomach cancer Neg Hx    Esophageal cancer Neg Hx    Endometrial cancer Neg Hx    Prostate cancer Neg Hx     SOCIAL HISTORY: Social History   Socioeconomic History   Marital status: Married    Spouse name: [name]   Number of children: 5   Years of education: 12   Highest education level: Not on file  Occupational History   Occupation: CNA  Tobacco Use   Smoking status: Never   Smokeless tobacco: Never  Vaping Use   Vaping status: Never Used  Substance and Sexual Activity   Alcohol use: No   Drug use: No   Sexual activity: Not Currently    Partners: Male  Other Topics Concern   Not on file  Social History Narrative   Patient lives at home with her husband Garry) and father in social worker and [place] her daughter.   Patient is taking care of her father full time and disabled daughter.   Education some college.    Right handed.   Caffeine one cup of coffee daily.   Pt states she is increasing her water intake.    Drinks decaf tea.       Social Drivers of Manufacturing Engineer Strain: Not on file  Food Insecurity: Not on file  Transportation Needs: Not on file  Physical Activity: Not on file  Stress: Not on file  Social Connections: Not on file  Intimate Partner Violence: Not on file    REVIEW OF SYSTEMS: New patient intake form was reviewed.  Complete 10-system review is negative except for the following: Appetite change, shortness of breath, constipation, urinary frequency, joint pain, somewhat diarrhea, mild pelvic pain, back pain, left leg numbness, decreased concentration, fatigue, abdominal pain, muscle cramp, sometimes dizziness, weight gain, leg swelling, itching on arms, confusion sometimes, pressure, bloating  PHYSICAL EXAM: BP 135/61 (BP Location: Right Arm, Patient Position: Sitting)   Pulse 74   Temp 97.9 F (36.6 C) (Oral)   Resp 18   Ht 5' 3 (1.6 m)   Wt 195 lb 6.4 oz (88.6 kg)   SpO2 99%   BMI 34.61 kg/m  Constitutional: No acute distress. Neuro/Psych: Alert, oriented.  Head and Neck: Normocephalic, atraumatic. Neck symmetric without masses. Sclera anicteric.  Respiratory: Normal work of breathing. Clear to auscultation bilaterally. Cardiovascular: Regular rate and rhythm, no murmurs, rubs, or gallops. Abdomen: Normoactive bowel sounds. Soft, non-distended, non-tender to palpation. No masses appreciated.  Midline upper abdominal incision with indentation in the midline from her scar. Extremities: Grossly normal range of motion. Warm, well perfused. No edema bilaterally. Skin: No rashes or lesions. Lymphatic: No cervical, supraclavicular, or inguinal adenopathy. Genitourinary: External genitalia without lesions. Urethral meatus without lesions or prolapse. On speculum exam, vagina and cervix without lesions. Bimanual exam  reveals normal cervix and mobile uterus.  No adnexal masses palpated. Exam chaperoned by [name], NP   LABORATORY AND RADIOLOGIC DATA: Outside medical records were reviewed to synthesize the  above history, along with the history and physical obtained during the visit.  Outside laboratory, pathology, and imaging reports were reviewed, with pertinent results below.  I personally reviewed the outside images.  WBC  Date Value Ref Range Status  06/12/2024 5.6 4.0 - 10.5 K/uL Final   Hemoglobin  Date Value Ref Range Status  06/12/2024 13.5 12.0 - 15.0 g/dL Final  90/75/7974 85.9 11.1 - 15.9 g/dL Final   HCT  Date Value Ref Range Status  06/12/2024 40.0 36.0 - 46.0 % Final   Hematocrit  Date Value Ref Range Status  04/25/2024 42.8 34.0 - 46.6 % Final   Platelets  Date Value Ref Range Status  06/12/2024 270 150 - 400 K/uL Final  04/25/2024 336 150 - 450 x10E3/uL Final   Creatinine, Ser  Date Value Ref Range Status  04/25/2024 1.09 (H) 0.57 - 1.00 mg/dL Final   AST  Date Value Ref Range Status  04/25/2024 16 0 - 40 IU/L Final   ALT  Date Value Ref Range Status  04/25/2024 15 0 - 32 IU/L Final   Surgical pathology (06/12/24): A. 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      See comment.  COMMENT: The specimen shows lymphoid tissue involved by a high-grade malignancy forming papillary architecture. Immunohistochemical stains were performed to characterize the tumor cells. The cells are positive for CK7, PAX8, and WT-1. The cells are negative for TTF-1, CDX2, and GATA3. P53 is a mutant type of staining / strongly and diffusely positive. 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   CT Chest High Resolution 06/21/2024  Narrative CLINICAL DATA:  Shortness of breath  EXAM: CT CHEST WITHOUT CONTRAST  TECHNIQUE: Multidetector CT imaging of the chest was performed following the standard protocol without intravenous contrast. High resolution imaging of the lungs, as well  as inspiratory and expiratory imaging, was performed.  RADIATION DOSE REDUCTION: This exam was performed according to the departmental dose-optimization program which includes automated exposure control, adjustment of the mA and/or kV according to patient size and/or use of iterative reconstruction technique.  COMPARISON:  CT cardiac Dec 18, 2019.  FINDINGS: Cardiovascular: The heart size is normal. Atherosclerotic calcifications of aorta and coronary arteries.  Mediastinum/Nodes: Subcentimeter mediastinal lymph nodes for example a 10 mm node in prevascular (9/141).  Lungs/Pleura: Diffuse mosaic ground-glass attenuation on expiratory phase suggestive of small airway disease/air trapping. Scattered micronodules and foci of mucostasis for example left upper lobe 8/26, 8/31 right lower lobe 8/81, right middle lobe 8/90.  Left upper lobe nodule measuring 6 mm (8/70).  Right lower lobe nodule measuring 7.5 mm (8/91), 6 mm (8/71).  Perifissural left upper lobe ground-glass ill-defined attenuation along the left major fissure measuring 10 mm, not in the field of view on prior exam inflammatory, indeterminate likely infectious/inflammatory.  Biapical pleural-parenchymal scarring. No new consolidation. Trachea and major airways are patent. Mild bronchial and bronchiolar wall thickening.  Upper Abdomen: Cholelithiasis. Multiple hypodense liver lesions incompletely assessed likely representing cysts. Atrophic changes of the pancreas. Surgical clips in left renal fossa. Anterior abdominal wall surgical mesh repair partially visualized.  Musculoskeletal: No suspicious osseous lesion.  IMPRESSION: Mosaic ground-glass attenuation suggestive  of small airway disease/air trapping which can be seen the setting of hypersensitivity pneumonitis.  Multiple scattered pulmonary nodules as detailed above, grossly stable to prior likely benign. Recommend continued attention on follow-up exam to  ensure stability.  Upper abdominal findings as above.  Atherosclerotic calcifications of aorta and coronary arteries.   Electronically Signed By: Megan  Zare [name] On: 06/21/2024 17:56   CT ABDOMEN PELVIS W CONTRAST 05/24/2024  Narrative EXAM: CT ABDOMEN AND PELVIS WITH CONTRAST 05/24/2024 02:51:28 PM  TECHNIQUE: CT of the abdomen and pelvis was performed with the administration of 100 mL of iohexol  (OMNIPAQUE ) 300 MG/ML solution. Multiplanar reformatted images are provided for review. Automated exposure control, iterative reconstruction, and/or weight-based adjustment of the mA/kV was utilized to reduce the radiation dose to as low as reasonably achievable.  COMPARISON: 08/14/2020  CLINICAL HISTORY: Generalized abdominal pain, weight gain, bloating. Bloating since July 2025. Cramping abdominal pain. Diarrhea, constipation. Hiatal hernia repair.  FINDINGS:  LOWER CHEST: Stable 6 mm pulmonary nodule in the anterior right lower lobe compared to previous chest CT on 02/04/2020, consistent with benign etiology.  LIVER: Small benign-appearing hepatic cyst. No suspicious hepatic masses.  GALLBLADDER AND BILE DUCTS: Gallstones are present, but without signs of cholecystitis. No biliary ductal dilatation.  SPLEEN: No acute abnormality.  PANCREAS: No acute abnormality.  ADRENAL GLANDS: No acute abnormality.  KIDNEYS, URETERS AND BLADDER: No stones in the kidneys or ureters. No hydronephrosis. No perinephric or periureteral stranding. Urinary bladder is unremarkable.  GI AND BOWEL: Stomach demonstrates no acute abnormality. There is no bowel obstruction.  PERITONEUM AND RETROPERITONEUM: No ascites. No free air.  VASCULATURE: Aorta is normal in caliber.  LYMPH NODES: New right external iliac chain lymphadenopathy with a 1.4 cm lymph node on image 69/2 and a 1.1 cm lymph node on image 72/2. No other sites of lymphadenopathy identified.  REPRODUCTIVE  ORGANS: No acute abnormality.  BONES AND SOFT TISSUES: No acute osseous abnormality. No focal soft tissue abnormality.  IMPRESSION: 1. New pelvic lymphadenopathy in the right external iliac chain. No abdominal lymphadenopathy identified. Differential diagnosis includes lymphoproliferative disorder metastatic disease, and infectious/inflammatory etiologies. Consider PET CT, 65-month follow up by CT, or tissue sampling. 2. Cholelithiasis without evidence of cholecystitis.  Electronically signed by: Norleen Kil MD 05/24/2024 08:50 PM EDT RP Workstation: [id number]

## 2024-06-25 NOTE — Telephone Encounter (Signed)
   Name: Hannah Short  DOB: [birth year]  MRN: [id number]  Primary Cardiologist: Dorn Lesches, MD   Preoperative team, please contact this patient and set up a phone call appointment for further preoperative risk assessment. Please obtain consent and complete medication review. Thank you for your help.  I confirm that guidance regarding antiplatelet and oral anticoagulation therapy has been completed and, if necessary, noted below.  None requested  I also confirmed the patient resides in the state of [place] . As per [facility] Medical Board telemedicine laws, the patient must reside in the state in which the provider is licensed.   Lum LITTIE Louis, NP 06/25/2024, 3:30 PM [facility]

## 2024-06-25 NOTE — Telephone Encounter (Signed)
   Pre-operative Risk Assessment    Patient Name: Hannah Short  DOB: 03/07/1956 MRN: [id number]   Date of last office visit: 05/09/2024, Dr. Dorn Lesches  Date of next office visit: NONE   Request for Surgical Clearance    Procedure:  Robotic assisted total laparoscopic hysterectomy, bilateral salping-oophorectomy, lymph node dissection, omentectomy, mini laparotomy   Date of Surgery:  Clearance 07/17/24                                Surgeon: Dr. Hoy Masters Surgeon's Group or Practice Name: [facility]  Phone number: [phone] Fax number: [phone]   Type of Clearance Requested:   - Medical    Type of Anesthesia:  General    Additional requests/questions:    SignedAsberry KANDICE Dunning   06/25/2024, 3:17 PM

## 2024-06-25 NOTE — Progress Notes (Signed)
 Patient here for a consult with Dr. Eldonna and for a pre-operative appointment prior to her scheduled surgery on 07/17/2024. She is scheduled for a robotic assisted total laparoscopic hysterectomy, bilateral salpingo-oophorectomy, lymph node dissection, omentectomyotomy, possible laparotomy. The surgery was discussed in detail.  See after visit summary for additional details.    Discussed post-op pain management in detail including the aspects of the enhanced recovery pathway.  Advised her that a new prescription would be sent in and it is only to be used for after her upcoming surgery.  We discussed the use of tylenol post-op and to monitor for a maximum of 4,000 mg in a 24 hour period.  Also prescribed sennakot to be used after surgery and to hold if having loose stools.  Discussed bowel regimen in detail.     Discussed the use of SCDs and measures to take at home to prevent DVT including frequent mobility.  Reportable signs and symptoms of DVT discussed. Post-operative instructions discussed and expectations for after surgery. Incisional care discussed as well including reportable signs and symptoms including erythema, drainage, wound separation.     30 minutes spent with the patient.  Verbalizing understanding of material discussed. No needs or concerns voiced at the end of the visit.   Advised patient to call for any needs.  Advised that her post-operative medications had been prescribed and could be picked up at any time.    This appointment is included in the global surgical bundle as pre-operative teaching and has no charge.

## 2024-06-26 ENCOUNTER — Encounter: Payer: Self-pay | Admitting: Family Medicine

## 2024-06-26 ENCOUNTER — Ambulatory Visit: Payer: Self-pay | Admitting: Psychiatry

## 2024-06-26 ENCOUNTER — Telehealth: Payer: Self-pay | Admitting: Oncology

## 2024-06-26 ENCOUNTER — Other Ambulatory Visit: Payer: Self-pay | Admitting: Family Medicine

## 2024-06-26 DIAGNOSIS — J679 Hypersensitivity pneumonitis due to unspecified organic dust: Secondary | ICD-10-CM

## 2024-06-26 LAB — CA 125: Cancer Antigen (CA) 125: 69 U/mL — ABNORMAL HIGH (ref 0.0–38.1)

## 2024-06-26 NOTE — Telephone Encounter (Signed)
 Debbie called and is wondering if her daughters need to have genetic testing and how to schedule it and if it would be covered by insurance.  Advised her that it will depend on her genetic testing results and to talk about it with Burnard Cal, Genetic Counselor at her appointment next week. Also discussed that genetic testing is preauthorized with insurance before it is done so she will know if it will be covered and if she will have a copay.

## 2024-06-27 ENCOUNTER — Telehealth: Payer: Self-pay

## 2024-06-27 NOTE — Telephone Encounter (Signed)
 FMLA forms received from pt's employer [employer]. Requested information provided and emailed to Ms. Harvel, per her request.

## 2024-06-27 NOTE — Telephone Encounter (Signed)
 Patient has cardiology appt on 12/3  Pulm office asked to refax form, form sent again

## 2024-06-27 NOTE — Telephone Encounter (Signed)
 Received pulm clearance

## 2024-07-02 ENCOUNTER — Telehealth: Payer: Self-pay | Admitting: Oncology

## 2024-07-02 NOTE — Telephone Encounter (Signed)
 Hannah Short left a message asking for a PET scan before surgery.  She would feel more at ease because she is having phantom pains in her left side and is worried it is her colon because of her family history of colon cancer.

## 2024-07-02 NOTE — Telephone Encounter (Signed)
 Called Hannah Short back and advised her that she has had CT scans and Dr. Eldonna feels this is adequate.  PET scans will only show areas of cancer lighting up. In regards to looking at organs more closely, the CT scan would show this.  Hannah Short verbalized understanding and agreement and did not have any further questions.

## 2024-07-02 NOTE — Progress Notes (Signed)
 Can you please ask this patient if she got her lab work for hypersensitivity pneumonitis? Thank you

## 2024-07-03 ENCOUNTER — Telehealth: Payer: Self-pay | Admitting: Oncology

## 2024-07-03 NOTE — Telephone Encounter (Signed)
 Hannah Short called and asked if she can have a hypersensitivity pneumonitis level drawn with her genetics labs tomorrow.  She was supposed to go to [facility] to have it done but is wondering if she can have it done at the [facility].  Advised I will check with lab and will call her back.

## 2024-07-04 ENCOUNTER — Encounter: Payer: Self-pay | Admitting: Genetic Counselor

## 2024-07-04 ENCOUNTER — Inpatient Hospital Stay

## 2024-07-04 ENCOUNTER — Ambulatory Visit: Attending: Cardiology | Admitting: Nurse Practitioner

## 2024-07-04 ENCOUNTER — Inpatient Hospital Stay: Admitting: Genetic Counselor

## 2024-07-04 ENCOUNTER — Telehealth: Payer: Self-pay | Admitting: *Deleted

## 2024-07-04 DIAGNOSIS — Z01810 Encounter for preprocedural cardiovascular examination: Secondary | ICD-10-CM | POA: Diagnosis present

## 2024-07-04 DIAGNOSIS — Z800 Family history of malignant neoplasm of digestive organs: Secondary | ICD-10-CM

## 2024-07-04 DIAGNOSIS — C763 Malignant neoplasm of pelvis: Secondary | ICD-10-CM

## 2024-07-04 DIAGNOSIS — Z803 Family history of malignant neoplasm of breast: Secondary | ICD-10-CM

## 2024-07-04 DIAGNOSIS — Z8041 Family history of malignant neoplasm of ovary: Secondary | ICD-10-CM

## 2024-07-04 LAB — GENETIC SCREENING ORDER

## 2024-07-04 NOTE — Telephone Encounter (Signed)
 12/2/2025Completed FMLA, short-term disability, critical illness and cancer claim  paperwork sent to provider to review, make any needed amendments, sign and return to form staff.   Connected with GYN-Oncology with questions as to why forms were provided to Medical Oncology.  Patient does not have a Treatment plan.  May need adjuvant chemotherapy after surgery on 07/17/2024 so expect forms to be returned to GYN-Onc. GYN had a copies to complete.  HAve e-mailed these to Barnie LELON [employer], [email or web address].   , completed .  taffSigned paperwork returned to this nurse.   No request for medical records.  Advised GYN to Obtain.[facility] HIPAA Authorization. And send copy of forms to HIM to be scanned to EMR.   Keeping copies of forms.to be resubmitted for leave to receive chemotherapy.    Emailing ROI's throughDocuSign to ensure the signed ROI is legible. Process completed with no further instructions received, actions performed or required by this nurse.

## 2024-07-04 NOTE — Progress Notes (Signed)
 Virtual Visit via Telephone Note   Because of Hannah Short co-morbid illnesses, she is at least at moderate risk for complications without adequate follow up.  This format is felt to be most appropriate for this patient at this time.  Due to technical limitations with video connection (technology), today's appointment will be conducted as an audio only telehealth visit, and Hannah Short verbally agreed to proceed in this manner.   All issues noted in this document were discussed and addressed.  No physical exam could be performed with this format.  Evaluation Performed:  Preoperative cardiovascular risk assessment _____________   Date:  07/04/2024   Patient ID:  Hannah Short, DOB 1956-07-18, MRN [id number] Patient Location:  Home Provider location:   Office  Primary Care Provider:  Dow Longs, PA-C Primary Cardiologist:  Dorn Lesches, MD  Chief Complaint / Patient Profile   68 y.o. y/o female with a h/o atypical chest pain (coronary calcium score of 0), grade 1 diastolic dysfunction, mild mitral valve regurgitation, hypertension, asthma and obesity who is pending Robotic assisted total laparoscopic hysterectomy, bilateral salping-oophorectomy, lymph node dissection, omentectomy, mini laparotomy on 07/17/2024 with Dr. Hoy Masters of [facility] and presents today for telephonic preoperative cardiovascular risk assessment.  History of Present Illness    Hannah Short is a 68 y.o. female who presents via audio/video conferencing for a telehealth visit today.  Pt was last seen in cardiology clinic on 05/19/2024 by Dr. Lesches.  At that time Hannah Short was doing well. She did report some shortness of breath.  Echocardiogram in 05/2024 was stable.  The patient is now pending procedure as outlined above. Since her last visit, she has been stable from a cardiac standpoint.  She has stable mild dyspnea with significant exertion.  She denies chest pain,  palpitations, pnd, orthopnea, n, v, dizziness, syncope, edema, weight gain, or early satiety. All other systems reviewed and are otherwise negative except as noted above.   Past Medical History    Past Medical History:  Diagnosis Date   Acute torn meniscus of knee, left, initial encounter 2019   Anemia    Anxiety    Arthritis    Asthma    Back pain    Bulging lumbar disc    Chest pain    Chronically dry eyes, bilateral    Constipation    Depression    Diverticulosis    Fatty liver    Fibromyalgia    Gallstones    GERD (gastroesophageal reflux disease)    Heart murmur    mitral valve prolapse   Hepatic steatosis    Hiatal hernia    Hypertension    Hypokalemia    Internal hemorrhoids    Joint pain    Lactose intolerance    Lipoma of colon    Migraine    Mitral valve prolapse    Neck pain    L3 4 and 5 bulging discs   Shortness of breath    Tubular adenoma of colon    Past Surgical History:  Procedure Laterality Date   CESAREAN SECTION     HERNIA REPAIR  07/2001   LSC ventral hernia repair with mesh (two prior ventral hernia repairs prior to that)   HYSTEROSCOPY W/ ENDOMETRIAL ABLATION  2005   TOE SURGERY  1990   stepped on toothpick    Allergies  Allergies  Allergen Reactions   Flagyl [Metronidazole] Rash    Patient states she breaks out with  a red, itchy rash all over and feels like she is on fire   Topamax [Topiramate] Other (See Comments)    Pt.states makes legs go numb   Betanidine [Bethanidine]    Clonazepam     BALANCE ISSUE   Tramadol Hcl     Causes nightmares   Betadine [Povidone Iodine] Rash   Sulfa Antibiotics Rash    Home Medications    Prior to Admission medications   Medication Sig Start Date End Date Taking? Authorizing Provider  albuterol  (PROVENTIL ) (2.5 MG/3ML) 0.083% nebulizer solution Take 3 mLs (2.5 mg total) by nebulization every 4 (four) hours as needed for wheezing or shortness of breath. 05/23/24   Iva Marty Saltness, MD   amLODipine  (NORVASC ) 2.5 MG tablet TAKE ONE TABLET BY MOUTH ONCE DAILY 11/11/23   Court Dorn PARAS, MD  arformoterol  (BROVANA ) 15 MCG/2ML NEBU Take 2 mLs (15 mcg total) by nebulization 2 (two) times daily. 05/23/24 09/20/24  Iva Marty Saltness, MD  budesonide  (PULMICORT ) 0.5 MG/2ML nebulizer solution Use one vial 3 times daily as needed during flares. Patient taking differently: Take 0.5 mg by nebulization in the morning and at bedtime. 06/18/24   Iva Marty Saltness, MD  famotidine (PEPCID) 40 MG tablet Take 40 mg by mouth in the morning.    [provider]  fexofenadine (ALLEGRA) 180 MG tablet Take 180 mg by mouth daily as needed for allergies.    [provider]  loratadine (CLARITIN) 10 MG tablet Take 10 mg by mouth daily as needed for allergies.    [provider]  omeprazole (PRILOSEC) 40 MG capsule Take 40 mg by mouth daily before breakfast.    [provider]  oxyCODONE  (OXY IR/ROXICODONE ) 5 MG immediate release tablet Take 1 tablet (5 mg total) by mouth every 4 (four) hours as needed for severe pain (pain score 7-10). For AFTER surgery only, do not take and drive 88/75[address]74   Cross, Eleanor D, NP  Plecanatide  (TRULANCE ) 3 MG TABS Take 3 mg by mouth in the morning.    [provider]  potassium chloride SA (KLOR-CON M) 20 MEQ tablet Take 20 mEq by mouth daily. 06/09/24   [provider]  senna-docusate (SENOKOT-S) 8.6-50 MG tablet Take 2 tablets by mouth at bedtime. For AFTER surgery, do not take if having diarrhea 06/25/24   Cross, Melissa D, NP  Simethicone 125 MG CAPS Take 125-250 mg by mouth as needed (stomach cramps).    [provider]  valACYclovir (VALTREX) 500 MG tablet Take 500 mg by mouth daily as needed (fever blisters (outbreaks)). 02/22/24   [provider]  VENTOLIN  HFA 108 (90 Base) MCG/ACT inhaler Inhale 1-2 puffs into the lungs every 6 (six) hours as needed for shortness of breath or wheezing. 04/19/24    [provider]    Physical Exam    Vital Signs:  Hannah Short does not have vital signs available for review today.  Given telephonic nature of communication, physical exam is limited. AAOx3. NAD. Normal affect.  Speech and respirations are unlabored.  Accessory Clinical Findings    None  Assessment & Plan    1.  Preoperative Cardiovascular Risk Assessment:  According to the Revised Cardiac Risk Index (RCRI), her Perioperative Risk of Major Cardiac Event is (%): 0.4. Her Functional Capacity in METs is: 5.04 according to the Duke Activity Status Index (DASI).Therefore, based on ACC/AHA guidelines, patient would be at acceptable risk for the planned procedure without further cardiovascular testing.  The patient was  advised that if she develops new symptoms prior to surgery to contact our office to arrange for a follow-up visit, and she verbalized understanding.   A copy of this note will be routed to requesting surgeon.  Time:   Today, I have spent 5 minutes with the patient with telehealth technology discussing medical history, symptoms, and management plan.     Damien JAYSON Braver, NP  07/04/2024, 2:28 PM

## 2024-07-04 NOTE — Progress Notes (Signed)
 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 Family history of malignant neoplasm of breast   3. Family history of malignant neoplasm of ovary   4. Family history of malignant neoplasm of gastrointestinal tract      HISTORY OF PRESENT ILLNESS:   Hannah Short, a 68 y.o. female, was seen for a [facility] cancer genetics consultation at the request of Eldonna Mays, MD due to a personal and family history of cancer.  Hannah Short presents to clinic today to discuss the possibility of a hereditary predisposition to cancer, to discuss genetic testing, and to further clarify her future cancer risks, as well as potential cancer risks for family members.   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 Dr. Eldonna suspects fallopian tube origin, plan for primary debulking surgery with TLH/BSO on 07/17/24.   RELEVANT MEDICAL HISTORY:  Menarche was at age 52.  First live birth at age 42.  Ovaries intact: yes.  Uterus intact: yes.  Menopausal status: postmenopausal.  HRT use: 0 years. Colonoscopy: yes; 2024, no polyps. 2023, three polyps (two TA). 2018, one polyp (TA) and one lipoma. 2009, no polyps. Mammogram within the last year: reports yes. Number of breast biopsies: 1. Benign   Past Medical History:  Diagnosis Date   Acute torn meniscus of knee, left, initial encounter 2019   Anemia    Anxiety    Arthritis    Asthma    Back pain    Bulging lumbar disc    Chest pain    Chronically dry eyes, bilateral    Constipation    Depression    Diverticulosis    Fatty liver    Fibromyalgia    Gallstones    GERD (gastroesophageal reflux disease)    Heart murmur    mitral valve prolapse   Hepatic steatosis    Hiatal hernia    Hypertension    Hypokalemia    Internal hemorrhoids    Joint pain    Lactose intolerance    Lipoma of  colon    Migraine    Mitral valve prolapse    Neck pain    L3 4 and 5 bulging discs   Shortness of breath    Tubular adenoma of colon     Past Surgical History:  Procedure Laterality Date   CESAREAN SECTION     HERNIA REPAIR  07/2001   LSC ventral hernia repair with mesh (two prior ventral hernia repairs prior to that)   HYSTEROSCOPY W/ ENDOMETRIAL ABLATION  2005   TOE SURGERY  1990   stepped on toothpick    Social History   Socioeconomic History   Marital status: Married    Spouse name: [name]   Number of children: 5   Years of education: 12   Highest education level: Not on file  Occupational History   Occupation: CNA  Tobacco Use   Smoking status: Never   Smokeless tobacco: Never  Vaping Use   Vaping status: Never Used  Substance and Sexual Activity   Alcohol use: No   Drug use: No   Sexual activity: Not Currently    Partners: Male  Other Topics Concern   Not on file  Social History Narrative   Patient lives at home with her husband Hannah Short) and father in social worker and [place] her daughter.  Patient is taking care of her father full time and disabled daughter.   Education some college.    Right handed.   Caffeine one cup of coffee daily.   Pt states she is increasing her water intake.    Drinks decaf tea.       Social Drivers of Corporate Investment Banker Strain: Not on file  Food Insecurity: Not on file  Transportation Needs: Not on file  Physical Activity: Not on file  Stress: Not on file  Social Connections: Not on file     FAMILY HISTORY:  We obtained a detailed, 4-generation family history.  Significant diagnoses are listed below: Family History  Problem Relation Age of Onset   Heart failure Mother    Heart failure Father    Colon cancer Father 86   Hypertension Father    Leukemia Paternal Uncle 11   Liver cancer Half-Brother        EtOH   Cancer Half-Brother        maternal, died from cancer (unknown type)   Lung cancer Half-Brother         smoking hx   Breast cancer Half-Sister        paternal, did not die from breast cancer   Ovarian cancer Half-Sister    Stomach cancer Neg Hx    Esophageal cancer Neg Hx    Endometrial cancer Neg Hx    Prostate cancer Neg Hx     Hannah Short is unaware of previous family history of genetic testing for hereditary cancer risks. There is no reported Ashkenazi Jewish ancestry. Per patient report. Hannah Short has five children. Her oldest daughter is 29 years old, no history of cancer and has five children. She has twin sons, age 63, no history of cancer and each has three children. Her daughter, age 62, no history of cancer and has three children. Her youngest daughter is 34 and has a history of spina bifida and hydrocephalus. Hannah Short has three full brothers, age 65 and 20 who do not have a history of cancer and have children who have not had cancer. Her full brother, age 36, has a history of diverticulosis and colectomy, no history of cancer, two daughters with no cancer. Her full sister, age 33, has a history of a partial hysterectomy. She has two children, a son that is living and a daughter that died in an accident, no cancer. Hannah Short mother had no history of cancer and died at 22. Her mother had three other sons (Hannah Short's maternal half-brothers) and all were diagnosed with cancer, one with an unknown (possible GI) primary, one with liver cancer and a history of alcohol use, and one with lung cancer and a history of smoking. None of their children have had cancer. Hannah Short reports her mother was one of 7 and that none of her mother's siblings, parents or children had cancer.  Hannah Short father had colon cancer at age 62 and passed away of unrelated causes at 22. He had four additional children from a previous marriage. Two have passed away with no cancer, one diagnosed with ovary cancer and one with breast cancer, both deceased. None of their children have cancer. Hannah Short has one paternal uncle that died  of leukemia at age 14. Her paternal grandparents did not have cancer to her knowledge.   GENETIC COUNSELING ASSESSMENT: Ms. Henery is a 68 y.o. female with a personal and family history of cancer which is somewhat suggestive of a  hereditary predisposition to cancer given her personal history of a fallopian tube cancer and family history of breast and ovarian cancer, and her family history of colon cancer under 50. We, therefore, discussed and recommended the following at today's visit.   DISCUSSION: We discussed that 5 - 10% of cancer is hereditary, with most cases of ovary or fallopian tube cancer associated with pathogenic variants in BRCA1/2.  There are other genes that can be associated with hereditary gyn cancer syndromes.  We discussed that testing is beneficial for several reasons including knowing how to follow individuals after completing their treatment, identifying whether potential treatment options  would be beneficial, and understanding if other family members could be at risk for cancer and allowing them to undergo genetic testing.   We reviewed the characteristics, features and inheritance patterns of hereditary cancer syndromes. We also discussed genetic testing, including the appropriate family members to test, the process of testing, insurance coverage and turn-around-time for results. We discussed the implications of a negative, positive, carrier and/or variant of uncertain significant result. We recommended Ms. Pio pursue genetic testing for a panel that includes genes associated with gynecologic, gastrointestinal and breast cancer.   Ms. Hogston  was offered a common hereditary cancer panel (40+ genes) and an expanded pan-cancer panel (70+ genes). Ms. Waterfield was informed of the benefits and limitations of each panel, including that expanded pan-cancer panels contain genes that do not have clear management guidelines at this point in time.  We also discussed that as the number of genes  included on a panel increases, the chances of variants of uncertain significance increases. After considering the benefits and limitations of each gene panel, Ms. Pavlak elected to have [facility]'s MultiCancer +RNA panel. The [facility] MultiCancers panel includes analysis of the following 70 genes:AIP, ALK, APC, ATM, AXIN2, BAP1, BARD1, BLM, BMPR1A, BRCA1, BRCA2, BRIP1, CDC73, CDH1, CDK4, CDKN1B, CDKN2A, CHEK2, CTNNA1, DICER1, EGFR, EPCAM, FH, FLCN, GREM1, HOXB13, KIT, LZTR1, MAX, MBD4, MEN1, MET, MITF, MLH1, MSH2, MSH6, MUTYH, NF1, NF2, NTHL1, PALB2, PDGFRA, PMS2, POLD1, POLE, POT1, PRKAR1A, PTCH1, PTEN, RAD51C, RAD51D, RB1, RET, SDHA, SDHAF2, SDHB, SDHC, SDHD, SMAD4, SMARCA4, SMARCB1, SMARCE1, STK11, SUFU, TMEM127, TP53, TSC1, TSC2, VHL. RNA analysis was included for applicable genes.   Based on Ms. Mcdaniel's personal and family history of cancer, she meets medical criteria for genetic testing. Despite that she meets criteria, she may still have an out of pocket cost. We discussed that if her out of pocket cost for testing is over $100, the laboratory should contact them to discuss self-pay prices, patient pay assistance programs, if applicable, and other billing options.  We discussed that some people do not want to undergo genetic testing due to fear of genetic discrimination.  A federal law called the Genetic Information Non-Discrimination Act (GINA) of 2008 helps protect individuals against genetic discrimination based on their genetic test results.  It impacts both health insurance and employment.  With health insurance, it protects against increased premiums, being kicked off insurance or being forced to take a test in order to be insured.  For employment it protects against hiring, firing and promoting decisions based on genetic test results.  GINA does not apply to those in the [employer], those who work for companies with less than 15 employees, and new life insurance or long-term disability insurance policies.   Health status due to a cancer diagnosis is not protected under GINA.  PLAN: After considering the risks, benefits, and limitations, Ms. Joos provided informed consent to pursue  genetic testing and the blood sample was sent to [facility] for analysis of the MultiCancer +RNA test. Results should be available within approximately 2-3 weeks' time, at which point they will be disclosed by telephone to Ms. Neighbors, as will any additional recommendations warranted by these results. Ms. Reen will receive a summary of her genetic counseling visit and a copy of her results once available. This information will also be available in Epic.   Lastly, we encouraged Ms. Dillow to remain in contact with cancer genetics annually so that we can continuously update the family history and inform her of any changes in cancer genetics and testing that may be of benefit for this family.   Ms. Berquist questions were answered to her satisfaction today. Our contact information was provided should additional questions or concerns arise. Thank you for the referral and allowing us  to share in the care of your patient.   Burnard Ogren, MS, [facility] Licensed, [email or web address] phone: [phone]   50 minutes were spent on the date of the encounter in service to the patient including preparation, face-to-face consultation, documentation and care coordination.  The patient was seen alone, husband and daughter in waiting room.  Drs. Gudena and/or Lanny were available to discuss this case as needed.   _______________________________________________________________________ For Office Staff:  Number of people involved in session: 1 Was an Intern/ student involved with case: no

## 2024-07-04 NOTE — Telephone Encounter (Signed)
 Left a message letting Marval know that I checked with the cancer center lab and they are recommending that she go to [facility] to have the lab drawn.  They think there may be a kit needed to draw the lab that they do not have.

## 2024-07-04 NOTE — Telephone Encounter (Signed)
 12/3, received cardiac clearance

## 2024-07-05 ENCOUNTER — Inpatient Hospital Stay: Admitting: Licensed Clinical Social Worker

## 2024-07-05 NOTE — Progress Notes (Signed)
 [facility] Clinical Social Work  Initial Assessment   Hannah Short is a 68 y.o. year old female contacted by phone. Clinical Social Work was referred by new patient protocol for assessment of psychosocial needs.   SDOH (Social Determinants of Health) assessments performed: Yes SDOH Interventions    Flowsheet Row Clinical Support from 07/05/2024 in [facility] - A Dept Of . [facility] Office Visit from 09/01/2020 in [facility] at [facility]  SDOH Interventions    Food Insecurity Interventions Intervention Not Indicated --  Housing Interventions Intervention Not Indicated --  Transportation Interventions Intervention Not Indicated --  Depression Interventions/Treatment  -- Counseling    SDOH Screenings   Food Insecurity: Unknown (07/05/2024)  Housing: Unknown (07/05/2024)  Transportation Needs: No Transportation Needs (07/05/2024)  Depression (PHQ2-9): Medium Risk (09/01/2020)  Tobacco Use: Low Risk  (07/04/2024)    PHQ 2/9:    09/01/2020    9:18 AM 12/17/2019    1:22 PM  Depression screen PHQ 2/9  Decreased Interest 2 0  Down, Depressed, Hopeless 1 0  PHQ - 2 Score 3 0  Altered sleeping 3   Tired, decreased energy 3   Change in appetite 2   Feeling bad or failure about yourself  0   Trouble concentrating 0   Moving slowly or fidgety/restless 0   Suicidal thoughts 0   PHQ-9 Score 11    Difficult doing work/chores Not difficult at all      Data saved with a previous flowsheet row definition     Distress Screen completed: No     No data to display            Family/Social Information:  Housing Arrangement: patient lives with husband, daughter Family members/support persons in your life? 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  Income source:  Employment Financial concerns: No Type of concern: None Food access concerns: no Religious or spiritual practice: Yes-faith in God and member of a church Advanced directives: Not known Services Currently in place:  Medicare  Coping/ Adjustment to diagnosis: Patient understands treatment plan and what happens next? yes, she is preparing for surgery which will then determine which further treatments are needed (if any). Pt likes to be prepared and is already thinking about wigs vs cold cap if she is going to lose her hair from treatment Concerns about diagnosis and/or treatment: physical recovery from surgery; potential loss of hair Patient reported stressors: Adjusting to my illness Patient enjoys time with family/ friends- she has a large family that is extemely impoartant to and supportive of her Current coping skills/ strengths: Ability for insight , Active sense of humor , Capable of independent living , Communication skills , Motivation for treatment/growth , Religious Affiliation , and Supportive family/friends     SUMMARY: Current SDOH Barriers:  No major barriers identified today  Clinical Social Work Clinical Goal(s):  No clinical social work goals at this time  Interventions: Discussed common feeling and emotions when being diagnosed with cancer, and the importance of support during treatment Informed patient of the support team roles and support services at [facility] Provided CSW contact information and encouraged patient to call with any questions or concerns Provided patient with information about [facility], [facility], and [facility]    Follow Up Plan: Patient will  contact CSW with any support or resource needs Patient verbalizes understanding of plan: Yes    Barret Esquivel E Nathin Saran, LCSW Clinical Social Worker [facility]

## 2024-07-09 LAB — HYPERSENSITIVITY PNEUMONITIS
A. Pullulans Abs: NEGATIVE
A.Fumigatus #1 Abs: NEGATIVE
Micropolyspora faeni, IgG: NEGATIVE
Pigeon Serum Abs: NEGATIVE
Thermoact. Saccharii: NEGATIVE
Thermoactinomyces vulgaris, IgG: NEGATIVE

## 2024-07-10 ENCOUNTER — Encounter (HOSPITAL_COMMUNITY): Payer: Self-pay

## 2024-07-10 ENCOUNTER — Other Ambulatory Visit: Payer: Self-pay

## 2024-07-10 ENCOUNTER — Encounter (HOSPITAL_COMMUNITY)
Admission: RE | Admit: 2024-07-10 | Discharge: 2024-07-10 | Disposition: A | Source: Ambulatory Visit | Attending: Psychiatry | Admitting: Psychiatry

## 2024-07-10 DIAGNOSIS — C579 Malignant neoplasm of female genital organ, unspecified: Secondary | ICD-10-CM

## 2024-07-10 HISTORY — DX: Attention-deficit hyperactivity disorder, unspecified type: F90.9

## 2024-07-10 HISTORY — DX: Pneumonia, unspecified organism: J18.9

## 2024-07-10 HISTORY — DX: Autoimmune thyroiditis: E06.3

## 2024-07-10 LAB — CBC
HCT: 41.6 % (ref 36.0–46.0)
Hemoglobin: 14 g/dL (ref 12.0–15.0)
MCH: 30.6 pg (ref 26.0–34.0)
MCHC: 33.7 g/dL (ref 30.0–36.0)
MCV: 91 fL (ref 80.0–100.0)
Platelets: 310 K/uL (ref 150–400)
RBC: 4.57 MIL/uL (ref 3.87–5.11)
RDW: 14 % (ref 11.5–15.5)
WBC: 5.5 K/uL (ref 4.0–10.5)
nRBC: 0 % (ref 0.0–0.2)

## 2024-07-10 LAB — COMPREHENSIVE METABOLIC PANEL WITH GFR
ALT: 23 U/L (ref 0–44)
AST: 25 U/L (ref 15–41)
Albumin: 4.4 g/dL (ref 3.5–5.0)
Alkaline Phosphatase: 89 U/L (ref 38–126)
Anion gap: 13 (ref 5–15)
BUN: 14 mg/dL (ref 8–23)
CO2: 24 mmol/L (ref 22–32)
Calcium: 9.8 mg/dL (ref 8.9–10.3)
Chloride: 103 mmol/L (ref 98–111)
Creatinine, Ser: 0.77 mg/dL (ref 0.44–1.00)
GFR, Estimated: >60 mL/min (ref >60)
Glucose, Bld: 113 mg/dL — ABNORMAL HIGH (ref 70–99)
Potassium: 3 mmol/L — ABNORMAL LOW (ref 3.5–5.1)
Sodium: 140 mmol/L (ref 135–145)
Total Bilirubin: 0.5 mg/dL (ref 0.0–1.2)
Total Protein: 8.1 g/dL (ref 6.5–8.1)

## 2024-07-10 NOTE — Progress Notes (Addendum)
 Date of COVID positive in last 90 days:  PCP - Rocky Don, PA Cardiologist - Dorn Lesches, MD  Clearance in media tab dated 06/29/24 Cardiac clearance in media tab dated 07/04/24 in Epic  Chest x-ray - 05/05/24 Epic EKG - 05/09/24 Epic  Stress Test - yes years ago perpt ECHO - 06/20/24 Epic Cardiac Cath - N/A Pacemaker/ICD device last checked:N/A Spinal Cord Stimulator:N/A  Bowel Prep - N/A  Sleep Study - N/A CPAP -   Fasting Blood Sugar - N/A Checks Blood Sugar _____ times a day  Last dose of GLP1 agonist-  N/A GLP1 instructions:  Do not take after     Last dose of SGLT-2 inhibitors-  N/A SGLT-2 instructions:  Do not take after     Blood Thinner Instructions: N/A Last dose:   Time: Aspirin Instructions:N/A Last Dose:  Activity level: Can go up a flight of stairs and perform activities of daily living without stopping and without symptoms of chest pain or shortness of breath.   Anesthesia review: MVP, HTN, SOB, asthma, left fascicular block, anemia  Patient denies shortness of breath, fever, cough and chest pain at PAT appointment  Patient verbalized understanding of instructions that were given to them at the PAT appointment. Patient was also instructed that they will need to review over the PAT instructions again at home before surgery.

## 2024-07-10 NOTE — Patient Instructions (Addendum)
 SURGICAL WAITING ROOM VISITATION  Patients having surgery or a procedure may have no more than 2 support people in the waiting area - these visitors may rotate.    Children ages 77 and under will not be able to visit patients in [facility] under most circumstances.   Visitors with respiratory illnesses are discouraged from visiting and should remain at home.  If the patient needs to stay at the hospital during part of their recovery, the visitor guidelines for inpatient rooms apply. Pre-op nurse will coordinate an appropriate time for 1 support person to accompany patient in pre-op.  This support person may not rotate.    Please refer to the [facility] website for the visitor guidelines for Inpatients (after your surgery is over and you are in a regular room).    Your procedure is scheduled on: 07/17/24   Report to [facility] Main Entrance    Report to admitting at 11:40 AM   Call this number if you have problems the morning of surgery [phone]   Do not eat food :After Midnight.   After Midnight you may have the following liquids until 10:55 AM DAY OF SURGERY  Water Non-Citrus Juices (without pulp, NO RED-Apple, White grape, White cranberry) Black Coffee (NO MILK/CREAM OR CREAMERS, sugar ok)  Clear Tea (NO MILK/CREAM OR CREAMERS, sugar ok) regular and decaf                             Plain Jell-O (NO RED)                                           Fruit ices (not with fruit pulp, NO RED)                                     Popsicles (NO RED)                                                               Sports drinks like Gatorade (NO RED)                      If you have questions, please contact your surgeon's office.   FOLLOW BOWEL PREP AND ANY ADDITIONAL PRE OP INSTRUCTIONS YOU RECEIVED FROM YOUR SURGEON'S OFFICE!!!     Oral Hygiene is also important to reduce your risk of infection.                                    Remember - BRUSH YOUR TEETH  THE MORNING OF SURGERY WITH YOUR REGULAR TOOTHPASTE  DENTURES WILL BE REMOVED PRIOR TO SURGERY PLEASE DO NOT APPLY Poly grip OR ADHESIVES!!!   Stop all vitamins and herbal supplements 7 days before surgery.   Take these medicines the morning of surgery with A SIP OF WATER: Inhaler, Amlodipine , Nebulizer, Famotidine, Allegra, Claritin, Omeprazole  You may not have any metal on your body including hair pins, jewelry, and body piercing             Do not wear make-up, lotions, powders, perfumes, or deodorant  Do not wear nail polish including gel and S&S, artificial/acrylic nails, or any other type of covering on natural nails including finger and toenails. If you have artificial nails, gel coating, etc. that needs to be removed by a nail salon please have this removed prior to surgery or surgery may need to be canceled/ delayed if the surgeon/ anesthesia feels like they are unable to be safely monitored.   Do not shave  48 hours prior to surgery.    Do not bring valuables to the hospital. [facility] IS NOT             RESPONSIBLE   FOR VALUABLES.   Contacts, glasses, dentures or bridgework may not be worn into surgery.   Bring small overnight bag day of surgery.   DO NOT BRING YOUR HOME MEDICATIONS TO THE HOSPITAL. PHARMACY WILL DISPENSE MEDICATIONS LISTED ON YOUR MEDICATION LIST TO YOU DURING YOUR ADMISSION IN THE HOSPITAL!   Special Instructions: Bring a copy of your healthcare power of attorney and living will documents the day of surgery if you haven't scanned them before.              Please read over the following fact sheets you were given: IF YOU HAVE QUESTIONS ABOUT YOUR PRE-OP INSTRUCTIONS PLEASE CALL [phone] [place].   If you received a COVID test during your pre-op visit  it is requested that you wear a mask when out in public, stay away from anyone that may not be feeling well and notify your surgeon if you develop symptoms. If you test  positive for Covid or have been in contact with anyone that has tested positive in the last 10 days please notify you surgeon.    [place] - Preparing for Surgery Before surgery, you can play an important role.  Because skin is not sterile, your skin needs to be as free of germs as possible.  You can reduce the number of germs on your skin by washing with CHG (chlorahexidine gluconate) soap before surgery.  CHG is an antiseptic cleaner which kills germs and bonds with the skin to continue killing germs even after washing. Please DO NOT use if you have an allergy to CHG or antibacterial soaps.  If your skin becomes reddened/irritated stop using the CHG and inform your nurse when you arrive at Short Stay. Do not shave (including legs and underarms) for at least 48 hours prior to the first CHG shower.  You may shave your face/neck.  Please follow these instructions carefully:  1.  Shower with CHG Soap the night before surgery ONLY (DO NOT USE THE SOAP THE MORNING OF SURGERY).  2.  If you choose to wash your hair, wash your hair first as usual with your normal  shampoo.  3.  After you shampoo, rinse your hair and body thoroughly to remove the shampoo.                             4.  Use CHG as you would any other liquid soap.  You can apply chg directly to the skin and wash.  Gently with a scrungie or clean washcloth.  5.  Apply the CHG Soap to your body ONLY FROM THE NECK  DOWN.   Do   not use on face/ open                           Wound or open sores. Avoid contact with eyes, ears mouth and   genitals (private parts).                       Wash face,  Genitals (private parts) with your normal soap.             6.  Wash thoroughly, paying special attention to the area where your    surgery  will be performed.  7.  Thoroughly rinse your body with warm water from the neck down.  8.  DO NOT shower/wash with your normal soap after using and rinsing off the CHG Soap.                9.  Pat yourself dry  with a clean towel.            10.  Wear clean pajamas.            11.  Place clean sheets on your bed the night of your first shower and do not  sleep with pets. Day of Surgery : Do not apply any CHG, lotions/deodorants the morning of surgery.  Please wear clean clothes to the hospital/surgery center.  FAILURE TO FOLLOW THESE INSTRUCTIONS MAY RESULT IN THE CANCELLATION OF YOUR SURGERY  PATIENT SIGNATURE_________________________________  NURSE SIGNATURE__________________________________  ________________________________________________________________________ WHAT IS A BLOOD TRANSFUSION? Blood Transfusion Information  A transfusion is the replacement of blood or some of its parts. Blood is made up of multiple cells which provide different functions. Red blood cells carry oxygen and are used for blood loss replacement. White blood cells fight against infection. Platelets control bleeding. Plasma helps clot blood. Other blood products are available for specialized needs, such as hemophilia or other clotting disorders. BEFORE THE TRANSFUSION  Who gives blood for transfusions?  Healthy volunteers who are fully evaluated to make sure their blood is safe. This is blood bank blood. Transfusion therapy is the safest it has ever been in the practice of medicine. Before blood is taken from a donor, a complete history is taken to make sure that person has no history of diseases nor engages in risky social behavior (examples are intravenous drug use or sexual activity with multiple partners). The donor's travel history is screened to minimize risk of transmitting infections, such as malaria. The donated blood is tested for signs of infectious diseases, such as HIV and hepatitis. The blood is then tested to be sure it is compatible with you in order to minimize the chance of a transfusion reaction. If you or a relative donates blood, this is often done in anticipation of surgery and is not appropriate for  emergency situations. It takes many days to process the donated blood. RISKS AND COMPLICATIONS Although transfusion therapy is very safe and saves many lives, the main dangers of transfusion include:  Getting an infectious disease. Developing a transfusion reaction. This is an allergic reaction to something in the blood you were given. Every precaution is taken to prevent this. The decision to have a blood transfusion has been considered carefully by your caregiver before blood is given. Blood is not given unless the benefits outweigh the risks. AFTER THE TRANSFUSION Right after receiving a blood transfusion, you will usually feel much better and more energetic. This  is especially true if your red blood cells have gotten low (anemic). The transfusion raises the level of the red blood cells which carry oxygen, and this usually causes an energy increase. The nurse administering the transfusion will monitor you carefully for complications. HOME CARE INSTRUCTIONS  No special instructions are needed after a transfusion. You may find your energy is better. Speak with your caregiver about any limitations on activity for underlying diseases you may have. SEEK MEDICAL CARE IF:  Your condition is not improving after your transfusion. You develop redness or irritation at the intravenous (IV) site. SEEK IMMEDIATE MEDICAL CARE IF:  Any of the following symptoms occur over the next 12 hours: Shaking chills. You have a temperature by mouth above 102 F (38.9 C), not controlled by medicine. Chest, back, or muscle pain. People around you feel you are not acting correctly or are confused. Shortness of breath or difficulty breathing. Dizziness and fainting. You get a rash or develop hives. You have a decrease in urine output. Your urine turns a dark color or changes to pink, red, or brown. Any of the following symptoms occur over the next 10 days: You have a temperature by mouth above 102 F (38.9 C), not  controlled by medicine. Shortness of breath. Weakness after normal activity. The white part of the eye turns yellow (jaundice). You have a decrease in the amount of urine or are urinating less often. Your urine turns a dark color or changes to pink, red, or brown. Document Released: 07/16/2000 Document Revised: 10/11/2011 Document Reviewed: 03/04/2008 [facility] Patient Information 2014 [place], [place].  _______________________________________________________________________

## 2024-07-11 ENCOUNTER — Ambulatory Visit: Payer: Self-pay | Admitting: Family Medicine

## 2024-07-11 ENCOUNTER — Telehealth: Payer: Self-pay | Admitting: Oncology

## 2024-07-11 ENCOUNTER — Ambulatory Visit: Payer: Self-pay | Admitting: *Deleted

## 2024-07-11 DIAGNOSIS — J984 Other disorders of lung: Secondary | ICD-10-CM

## 2024-07-11 HISTORY — DX: Other disorders of lung: J98.4

## 2024-07-11 NOTE — Progress Notes (Signed)
 Can you please let this patient know that her hypersensitivity pneumonitis panel was negative. However, the CT scan indicated small airway disease and air trapping. There is also evidence of stable pulmonary nodules. Can you please refer to pulmonary specialist for further evaluation. Thank you

## 2024-07-11 NOTE — Telephone Encounter (Signed)
 Hannah Short called and asked if it is ok to take Zyrtec for allergies before surgery.  She has been taking Claritin but it is not helping.  Advised Zyrtec is ok to take.  She also asked about the visitation policy for the hospital.  Her husband would like to stay the night with her but he will need to help their daughter who is disabled. Is it ok for her daughter to stay as well?  Advised her that 1 overnight guest is ok and to ask the nurse at the hospital if it would be ok for her daughter to stay as well.

## 2024-07-11 NOTE — Telephone Encounter (Signed)
 Spoke with Ms. Chason and relayed message from [name], NP that patient's potassium is low on recent labs. Pt states she has been having a hard time taking her 20 meq daily of potassium because they are hard to swallow and she knows she can't crush them. Pt does break them in half. Advised patient that provider wants her to increase this to twice daily for the next three days. Pt states she can try again and will break them in half. Pt also has increased protein rich foods in her diet. Advised patient she can try and drink plenty of water while taking the tablets and even try taking with ice cream? Pt states she will and is aware to call the office back if she is unable to take the potassium. Pt thanked the office for calling.   Lab results faxed to patient's PCP  Rocky Don, PA-C at fax #[phone].

## 2024-07-11 NOTE — Progress Notes (Signed)
 Anesthesia Chart Review   Case: [id number] Date/Time: 07/17/24 1339   Procedures:      HYSTERECTOMY, TOTAL, ROBOT-ASSISTED, LAPAROSCOPIC, WITH BILATERAL SALPINGO-OOPHORECTOMY (Bilateral)     LYMPHADENECTOMY, PELVIS, ROBOT-ASSISTED     OMENTECTOMY - MINI LAPAROTOMY   Anesthesia type: General   Pre-op diagnosis: C57.9 GYN MALIGNANCY   Location: [facility] ROOM 05 / [facility]   Surgeons: Eldonna Mays, MD       DISCUSSION:68 y.o. never smoker with h/o HTN, asthma, Hashimoto's, MVP, gyn malignancy scheduled for above procedure 07/17/2024 with Dr. Mays Eldonna.   Per cardiology preoperative evaluation 07/04/2024, According to the Revised Cardiac Risk Index (RCRI), her Perioperative Risk of Major Cardiac Event is (%): 0.4. Her Functional Capacity in METs is: 5.04 according to the [facility] Activity Status Index (DASI).Therefore, based on ACC/AHA guidelines, patient would be at acceptable risk for the planned procedure without further cardiovascular testing.   VS: BP (!) 128/91   Pulse 84   Temp 36.9 C (Oral)   Resp 16   Ht 5' 3 (1.6 m)   Wt 88.6 kg   SpO2 97%   BMI 34.61 kg/m   PROVIDERS: Dow Longs, PA-C is PCP   Cardiologist - Dorn Lesches, MD   LABS: Labs reviewed: Acceptable for surgery. (all labs ordered are listed, but only abnormal results are displayed)  Labs Reviewed  COMPREHENSIVE METABOLIC PANEL WITH GFR - Abnormal; Notable for the following components:      Result Value   Potassium 3.0 (*)    Glucose, Bld 113 (*)    All other components within normal limits  CBC  TYPE AND SCREEN     IMAGES:   EKG:   CV: Echo 06/20/2024  1. Left ventricular ejection fraction, by estimation, is 55 to 60%. The  left ventricle has normal function. The left ventricle has no regional  wall motion abnormalities. Left ventricular diastolic parameters are  consistent with Grade I diastolic  dysfunction (impaired relaxation). The average left ventricular global  longitudinal  strain is -17.2 %. The global longitudinal strain is normal.   2. Right ventricular systolic function is normal. The right ventricular  size is normal. There is normal pulmonary artery systolic pressure.   3. The mitral valve is normal in structure. Mild mitral valve  regurgitation. No evidence of mitral stenosis.   4. The aortic valve is tricuspid. Aortic valve regurgitation is not  visualized. No aortic stenosis is present.   5. The inferior vena cava is normal in size with greater than 50%  respiratory variability, suggesting right atrial pressure of 3 mmHg.  Past Medical History:  Diagnosis Date   Acute torn meniscus of knee, left, initial encounter 2019   ADHD (attention deficit hyperactivity disorder)    Anemia    Anxiety    Arthritis    Asthma    Back pain    Bulging lumbar disc    Chest pain    Chronically dry eyes, bilateral    Constipation    Depression    Diverticulosis    Fatty liver    Fibromyalgia    Gallstones    GERD (gastroesophageal reflux disease)    Hashimoto's disease    Heart murmur    mitral valve prolapse   Hepatic steatosis    Hiatal hernia    Hypertension    Hypokalemia    Internal hemorrhoids    Joint pain    Lactose intolerance    Lipoma of colon    ??   Migraine  Mitral valve prolapse    Neck pain    L3 4 and 5 bulging discs   Pneumonia    Shortness of breath    Tubular adenoma of colon     Past Surgical History:  Procedure Laterality Date   CESAREAN SECTION     COLONOSCOPY     Dr. Albertus   HERNIA REPAIR  07/2001   LSC ventral hernia repair with mesh (two prior ventral hernia repairs prior to that)   HYSTEROSCOPY W/ ENDOMETRIAL ABLATION  2005   TOE SURGERY  1990   stepped on toothpick    MEDICATIONS:  fluticasone (FLONASE) 50 MCG/ACT nasal spray   albuterol  (PROVENTIL ) (2.5 MG/3ML) 0.083% nebulizer solution   amLODipine  (NORVASC ) 2.5 MG tablet   arformoterol  (BROVANA ) 15 MCG/2ML NEBU   budesonide  (PULMICORT ) 0.5 MG/2ML  nebulizer solution   famotidine (PEPCID) 40 MG tablet   fexofenadine (ALLEGRA) 180 MG tablet   loratadine (CLARITIN) 10 MG tablet   omeprazole (PRILOSEC) 40 MG capsule   oxyCODONE  (OXY IR/ROXICODONE ) 5 MG immediate release tablet   Plecanatide  (TRULANCE ) 3 MG TABS   potassium chloride SA (KLOR-CON M) 20 MEQ tablet   senna-docusate (SENOKOT-S) 8.6-50 MG tablet   Simethicone 125 MG CAPS   valACYclovir (VALTREX) 500 MG tablet   VENTOLIN  HFA 108 (90 Base) MCG/ACT inhaler   No current facility-administered medications for this encounter.    Harlene Hoots Ward, PA-C [facility] Pre-Surgical Testing [phone]

## 2024-07-11 NOTE — Telephone Encounter (Signed)
-----   Message from Eleanor JONETTA Epps sent at 07/11/2024 10:17 AM EST ----- Please let the patient know her potassium is low on recent labs. It looks like she is taking 20 meq daily. Please have her increase this to twice daily for the next 3 days. Also please fax results of  labs to PCP. Thanks ----- Message ----- From: Rebecka, Lab In [place] Sent: 07/10/2024   4:30 PM EST To: Eleanor JONETTA Epps, NP

## 2024-07-13 ENCOUNTER — Telehealth: Payer: Self-pay | Admitting: Oncology

## 2024-07-13 NOTE — Telephone Encounter (Signed)
 Hannah Short called regarding her insurance paperwork.  Advised her that it has been completed and faxed to her insurance company.  She asked if she can pick up a next week when she surgery.  Advised we will have a copy for her in the office.

## 2024-07-16 ENCOUNTER — Telehealth: Payer: Self-pay | Admitting: *Deleted

## 2024-07-16 NOTE — Telephone Encounter (Signed)
 Received critical illness claim paperwork on 12/10. Paperwork filled out and fax to [employer] and [employer] on 12/11

## 2024-07-16 NOTE — Telephone Encounter (Signed)
Telephone call to check on pre-operative status.  Patient compliant with pre-operative instructions.  Reinforced nothing to eat after midnight. Clear liquids until 1030. Patient to arrive at 1130.  No questions or concerns voiced.  Instructed to call for any needs.  °

## 2024-07-17 ENCOUNTER — Encounter (HOSPITAL_COMMUNITY): Payer: Self-pay | Admitting: Psychiatry

## 2024-07-17 ENCOUNTER — Ambulatory Visit (HOSPITAL_COMMUNITY)
Admission: RE | Admit: 2024-07-17 | Discharge: 2024-07-17 | DRG: 756 | Disposition: A | Discharge status: Home / Self Care | Source: Ambulatory Visit | Attending: Psychiatry | Admitting: Psychiatry

## 2024-07-17 ENCOUNTER — Encounter (HOSPITAL_COMMUNITY): Payer: Self-pay | Admitting: Medical

## 2024-07-17 ENCOUNTER — Inpatient Hospital Stay (HOSPITAL_COMMUNITY): Admitting: Anesthesiology

## 2024-07-17 ENCOUNTER — Encounter (HOSPITAL_COMMUNITY): Admission: RE | Disposition: A | Payer: Self-pay | Source: Ambulatory Visit | Attending: Psychiatry

## 2024-07-17 DIAGNOSIS — C55 Malignant neoplasm of uterus, part unspecified: Secondary | ICD-10-CM | POA: Diagnosis present

## 2024-07-17 DIAGNOSIS — C579 Malignant neoplasm of female genital organ, unspecified: Secondary | ICD-10-CM

## 2024-07-17 DIAGNOSIS — Z538 Procedure and treatment not carried out for other reasons: Secondary | ICD-10-CM | POA: Diagnosis present

## 2024-07-17 LAB — TYPE AND SCREEN
ABO/RH(D): O POS
Antibody Screen: NEGATIVE

## 2024-07-17 LAB — ABO/RH: ABO/RH(D): O POS

## 2024-07-17 MED ORDER — SUGAMMADEX SODIUM 200 MG/2ML IV SOLN
INTRAVENOUS | Status: AC
  Filled 2024-07-17: qty 2

## 2024-07-17 MED ORDER — ACETAMINOPHEN 500 MG PO TABS: 1000.0000 mg | ORAL_TABLET | Freq: Once | ORAL | Status: AC

## 2024-07-17 MED ORDER — PHENYLEPHRINE HCL (PRESSORS) 10 MG/ML IV SOLN
INTRAVENOUS | Status: AC
  Filled 2024-07-17: qty 1

## 2024-07-17 MED ORDER — LIDOCAINE HCL (PF) 2 % IJ SOLN
INTRAMUSCULAR | Status: AC
  Filled 2024-07-17: qty 5

## 2024-07-17 MED ORDER — HEPARIN SODIUM (PORCINE) 5000 UNIT/ML IJ SOLN
5000.0000 [IU] | INTRAMUSCULAR | Status: AC
  Administered 2024-07-17: 13:00:00 5000 [IU] via SUBCUTANEOUS
  Filled 2024-07-17: qty 1

## 2024-07-17 MED ORDER — LACTATED RINGERS IV SOLN: INTRAVENOUS | Status: DC

## 2024-07-17 MED ORDER — ROCURONIUM BROMIDE 10 MG/ML (PF) SYRINGE
PREFILLED_SYRINGE | INTRAVENOUS | Status: AC
  Filled 2024-07-17: qty 10

## 2024-07-17 MED ORDER — HYDROMORPHONE HCL 2 MG/ML IJ SOLN
INTRAMUSCULAR | Status: AC
  Filled 2024-07-17: qty 1

## 2024-07-17 MED ORDER — MIDAZOLAM HCL 2 MG/2ML IJ SOLN
INTRAMUSCULAR | Status: AC
  Filled 2024-07-17: qty 2

## 2024-07-17 MED ORDER — ORAL CARE MOUTH RINSE: 15.0000 mL | Freq: Once | OROMUCOSAL | Status: AC

## 2024-07-17 MED ORDER — GENTAMICIN SULFATE 40 MG/ML IJ SOLN
5.0000 mg/kg | INTRAVENOUS | Status: DC
  Filled 2024-07-17: qty 8.25

## 2024-07-17 MED ORDER — ACETAMINOPHEN 500 MG PO TABS
1000.0000 mg | ORAL_TABLET | ORAL | Status: AC
  Administered 2024-07-17: 13:00:00 1000 mg via ORAL
  Filled 2024-07-17: qty 2

## 2024-07-17 MED ORDER — DEXAMETHASONE SOD PHOSPHATE PF 10 MG/ML IJ SOLN: 4.0000 mg | INTRAMUSCULAR | Status: DC

## 2024-07-17 MED ORDER — KETAMINE HCL 50 MG/5ML IJ SOSY
PREFILLED_SYRINGE | INTRAMUSCULAR | Status: AC
  Filled 2024-07-17: qty 5

## 2024-07-17 MED ORDER — FENTANYL CITRATE (PF) 100 MCG/2ML IJ SOLN
INTRAMUSCULAR | Status: AC
  Filled 2024-07-17: qty 2

## 2024-07-17 MED ORDER — PROPOFOL 1000 MG/100ML IV EMUL
INTRAVENOUS | Status: AC
  Filled 2024-07-17: qty 100

## 2024-07-17 MED ORDER — CHLORHEXIDINE GLUCONATE 0.12 % MT SOLN
15.0000 mL | Freq: Once | OROMUCOSAL | Status: AC
  Administered 2024-07-17: 13:00:00 15 mL via OROMUCOSAL

## 2024-07-17 MED ORDER — ONDANSETRON HCL 4 MG/2ML IJ SOLN
INTRAMUSCULAR | Status: AC
  Filled 2024-07-17: qty 2

## 2024-07-17 MED ORDER — CLINDAMYCIN PHOSPHATE 900 MG/50ML IV SOLN
900.0000 mg | INTRAVENOUS | Status: DC
  Filled 2024-07-17: qty 50

## 2024-07-17 NOTE — H&P (Signed)
 Patient surgery cancelled and rescheduled to 12/24 with Dr. Viktoria due to earlier surgery in the day going longer than anticipated.

## 2024-07-17 NOTE — Progress Notes (Signed)
 Short Stay Nursing Note: Surgical Procedure scheduled for today cancelled by Surgeon. NP from MD group in to speak with patient to inform of rationale for canceling procedure. Orders rec'd from NP to remove peripheral IVs and DC to home. MD office will contact pt about surgery being postponed to Dec 24th, 2025. Opportunity for questions provided.

## 2024-07-17 NOTE — Anesthesia Preprocedure Evaluation (Signed)
 Anesthesia Evaluation    Reviewed: Allergy & Precautions, Patient's Chart, lab work & pertinent test results  Airway        Dental   Pulmonary asthma           Cardiovascular hypertension, Pt. on medications + Valvular Problems/Murmurs (mild MR) MR   Echo 06/2024  1. Left ventricular ejection fraction, by estimation, is 55 to 60%. The  left ventricle has normal function. The left ventricle has no regional  wall motion abnormalities. Left ventricular diastolic parameters are  consistent with Grade I diastolic  dysfunction (impaired relaxation). The average left ventricular global  longitudinal strain is -17.2 %. The global longitudinal strain is normal.   2. Right ventricular systolic function is normal. The right ventricular  size is normal. There is normal pulmonary artery systolic pressure.   3. The mitral valve is normal in structure. Mild mitral valve  regurgitation. No evidence of mitral stenosis.   4. The aortic valve is tricuspid. Aortic valve regurgitation is not  visualized. No aortic stenosis is present.   5. The inferior vena cava is normal in size with greater than 50%  respiratory variability, suggesting right atrial pressure of 3 mmHg.     Neuro/Psych  Headaches PSYCHIATRIC DISORDERS Anxiety Depression       GI/Hepatic Neg liver ROS, hiatal hernia,GERD  Controlled and Medicated,,  Endo/Other  Obesity BMI 35  Renal/GU negative Renal ROS  negative genitourinary   Musculoskeletal  (+) Arthritis , Osteoarthritis,  Fibromyalgia -  Abdominal  (+) + obese  Peds  Hematology negative hematology ROS (+) Hb 14, plt 310   Anesthesia Other Findings   Reproductive/Obstetrics negative OB ROS                              Anesthesia Physical Anesthesia Plan  ASA: 2  Anesthesia Plan: General   Post-op Pain Management: Tylenol  PO (pre-op)*, Ketamine  IV* and Dilaudid  IV   Induction:  Intravenous  PONV Risk Score and Plan: 4 or greater and Ondansetron , Dexamethasone , Midazolam  and Treatment may vary due to age or medical condition  Airway Management Planned: Oral ETT  Additional Equipment: None  Intra-op Plan:   Post-operative Plan: Extubation in OR  Informed Consent:   Plan Discussed with:   Anesthesia Plan Comments:         Anesthesia Quick Evaluation

## 2024-07-17 NOTE — Discharge Instructions (Addendum)
 We will call you tomorrow to discuss new times for surgery with Dr. Comer Dollar on 07/25/24

## 2024-07-19 ENCOUNTER — Telehealth: Payer: Self-pay | Admitting: *Deleted

## 2024-07-19 ENCOUNTER — Telehealth: Payer: Self-pay

## 2024-07-19 NOTE — Telephone Encounter (Signed)
 Spoke with patient who called the office to let providers know she was diagnosed with Covid today at her PCP appointment. Pt feels awful. She has a cough, fatigue with head congestion. Pt understands she will need to reschedule her surgery. Advised patient to take care of herself and the office would be reaching back out.

## 2024-07-19 NOTE — Telephone Encounter (Signed)
 New surgery date from 12/16 to 12/24. FMLA/disability forms updated. Faxed to [employer]) and emailed to patient.

## 2024-07-20 ENCOUNTER — Telehealth: Payer: Self-pay | Admitting: Family Medicine

## 2024-07-20 ENCOUNTER — Ambulatory Visit: Payer: Self-pay | Admitting: Genetic Counselor

## 2024-07-20 ENCOUNTER — Telehealth: Payer: Self-pay | Admitting: Genetic Counselor

## 2024-07-20 DIAGNOSIS — Z1379 Encounter for other screening for genetic and chromosomal anomalies: Secondary | ICD-10-CM | POA: Insufficient documentation

## 2024-07-20 NOTE — Telephone Encounter (Signed)
 LVM asking for call back to review results of genetic testing.

## 2024-07-20 NOTE — Progress Notes (Addendum)
 Called patient to update on arrival time to hospital for surgery 07/25/24. Patient to arrive at 1135. She can have clear liquids until 1050 the morning of surgery. She tested positive for COVID 07/19/24 and has a cough. Dr. Lewie office aware.She is taking prednisone  20mg  tab x2 every morning for 5 days, azithromycin  250mg  tab once a day for 5 days, and Guaifenesin with codeine PRN for cough

## 2024-07-20 NOTE — Telephone Encounter (Signed)
 Spoke with patient about re-scheduling her surgery with Dr. Eldonna. Pt is requesting a repeat CT scan prior to planning a new surgery date. Advised patient her message will be relayed to providers and the office will reach back out. Pt thanked the office.

## 2024-07-20 NOTE — Progress Notes (Signed)
 HPI:  Hannah Short was previously seen in the [facility] due to a personal and family history of cancer and concerns regarding a hereditary predisposition to cancer. Please refer to our prior cancer genetics clinic note for more information regarding our discussion, assessment and recommendations, at the time. Hannah Short recent genetic test results were disclosed to her, as were recommendations warranted by these results. These results and recommendations are discussed in more detail below.  Results were disclosed by telephone on 07/20/24.   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 Dr. Eldonna suspects fallopian tube origin, plan for primary debulking surgery with TLH/BSO on 07/25/24.   FAMILY HISTORY:  We obtained a detailed, 4-generation family history.  Significant diagnoses are listed below: Family History  Problem Relation Age of Onset   Heart failure Mother    Heart failure Father    Colon cancer Father 32   Hypertension Father    Leukemia Paternal Uncle 8   Liver cancer Half-Brother        [place]   Cancer Half-Brother        maternal, died from cancer (unknown type)   Lung cancer Half-Brother        smoking hx   Breast cancer Half-Sister        paternal, did not die from breast cancer   Ovarian cancer Half-Sister    Stomach cancer Neg Hx    Esophageal cancer Neg Hx    Endometrial cancer Neg Hx    Prostate cancer Neg Hx     Hannah Short is unaware of previous family history of genetic testing for hereditary cancer risks. There is no reported Ashkenazi Jewish ancestry. Per patient report. Hannah Short has five children. Her oldest daughter is 38 years old, no history of cancer and has five children. She has twin sons, age 109, no history of cancer and each has three children. Her daughter, age 68, no history of cancer and has three children. Her youngest daughter is 102 and has a history of spina bifida  and hydrocephalus. Hannah Short has three full brothers, age 54 and 65 who do not have a history of cancer and have children who have not had cancer. Her full brother, age 25, has a history of diverticulosis and colectomy, no history of cancer, two daughters with no cancer. Her full sister, age 78, has a history of a partial hysterectomy. She has two children, a son that is living and a daughter that died in an accident, no cancer. Hannah Short mother had no history of cancer and died at 41. Her mother had three other sons (Hannah Short's maternal half-brothers) and all were diagnosed with cancer, one with an unknown (possible GI) primary, one with liver cancer and a history of alcohol use, and one with lung cancer and a history of smoking. None of their children have had cancer. Hannah Short reports her mother was one of 7 and that none of her mother's siblings, parents or children had cancer.  Hannah Short father had colon cancer at age 32 and passed away of unrelated causes at 70. He had four additional children from a previous marriage. Two have passed away with no cancer, one diagnosed with ovary cancer and one with breast cancer, both deceased. None of their children have cancer. Hannah Short has one paternal uncle that died of leukemia at age 76. Her paternal grandparents did not have cancer to  her knowledge.   GENETIC TEST RESULTS: Genetic testing reported out on 07/12/24 through the [facility] Multi-[facility] +RNA panel found no pathogenic mutations. The [facility] panel includes analysis of the following 70 genes:AIP, ALK, APC, ATM, AXIN2, BAP1, BARD1, BLM, BMPR1A, BRCA1, BRCA2, BRIP1, CDC73, CDH1, CDK4, CDKN1B, CDKN2A, CHEK2, CTNNA1, DICER1, EGFR, EPCAM, FH, FLCN, GREM1, HOXB13, KIT, LZTR1, MAX, MBD4, MEN1, MET, MITF, MLH1, MSH2, MSH6, MUTYH, NF1, NF2, NTHL1, PALB2, PDGFRA, PMS2, POLD1, POLE, POT1, PRKAR1A, PTCH1, PTEN, RAD51C, RAD51D, RB1, RET, SDHA, SDHAF2, SDHB, SDHC, SDHD, SMAD4, SMARCA4, SMARCB1, SMARCE1, STK11,  SUFU, TMEM127, TP53, TSC1, TSC2, VHL. RNA analysis was included for applicable genes. The test report has been scanned into EPIC and is located under the Molecular Pathology section of the Results Review tab.  A portion of the result report is included below for reference.     As current genetic testing is not perfect, it is possible there may be a gene mutation in one of these genes that current testing cannot detect, but that chance is small.  We also discussed, that there could be another gene that has not yet been discovered, or that we have not yet tested, that is responsible for the cancer diagnoses in the family. It is also possible there is a hereditary cause for the cancer in the family that Hannah Short did not inherit and therefore was not identified in her testing.  Therefore, it is important to remain in touch with cancer genetics in the future so that we can continue to offer Hannah Short the most up to date genetic testing.   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  At this time, it is unknown if this variant is associated with increased cancer risk or if this is a normal finding, but most variants such as this get reclassified to being inconsequential. It should not be used to make medical management decisions. With time, we suspect the lab will determine the significance of this variant, if any. If we do learn more about it, we will try to contact Hannah Short to discuss it further. However, it is important to stay in touch with us  periodically and keep the address and phone number up to date.  ADDITIONAL GENETIC TESTING: Hannah Short genetic testing was fairly extensive.  If there are genes identified to increase cancer risk that can be analyzed in the future, we would be happy to discuss and coordinate this testing at that time.    CANCER SCREENING RECOMMENDATIONS: Hannah Short test result is considered  negative (normal).  This means that we have not identified a hereditary cause for her personal and family history of cancer at this time. Most cancers happen by chance and this negative test suggests that her personal and family history of cancer may fall into this category.    Possible reasons for Hannah Short's negative genetic test include:  1. There may be a gene mutation in one of these genes that current testing methods cannot detect. The likelihood of this is low, but possible.   2. There could be another gene that has not yet been discovered, or that we have not yet tested, that is responsible for the cancer diagnoses in the family.  3.  There may be no hereditary risk for cancer in the family. The cancers in Hannah Short and/or her family may be sporadic/familial or due to other genetic and environmental factors. 4. It is also possible there  is a hereditary cause for the cancer in the family that Hannah Short. Standish did not inherit.  Therefore, it is recommended she continue to follow the cancer management and screening guidelines provided by her oncology and primary healthcare provider. An individual's cancer risk and medical management are not determined by genetic test results alone. Overall cancer risk assessment incorporates additional factors, including personal medical history, family history, and any available genetic information that may result in a personalized plan for cancer prevention and surveillance  Given Hannah Short. Monts's personal and family histories, we must interpret these negative results with some caution.  Families with features suggestive of hereditary risk for cancer tend to have multiple family members with cancer, diagnoses in multiple generations and diagnoses before the age of 8. Hannah Short. Nave family exhibits some of these features. Thus, this result may simply reflect our current inability to detect all mutations within these genes or there may be a different gene that has not yet been  discovered or tested.   Based on Hannah Short. Mcclees's family history of a first degree relative with colorectal cancer, the National Comprehensive Cancer Network: Colorectal Cancer Screening v2.2025 recommends that colonoscopies begin at age 65 (or 10 years prior to the age at diagnosis of colorectal cancer in their close relative) and are repeated at least every 5 years, or more often as needed.   Hannah Short. Santoni may have an increased risk for breast cancer. The Alisa model has calculated her 5 year risk for breast cancer to be 1.79%. NCCN recommends that women with a 5-year risk for breast cancer over 1.7% per Alisa model:  Clinical encounter every 6-12 months, to begin when increased risk is identified  Annual screening mammogram with tomosynthesis  To begin when identified as being at increased risk  Consider risk reduction strategies, such as medication to decrease the risk for breast cancer  Breast awareness  Encourage Hannah Short. Ngu to discuss with her oncology provider.   RECOMMENDATIONS FOR FAMILY MEMBERS:  Individuals in this family might be at some increased risk of developing cancer, over the general population risk, simply due to the family history of cancer.  We recommended women in this family have a yearly mammogram beginning at age 84, or 17 years younger than the earliest onset of cancer, an annual clinical breast exam, and perform monthly breast self-exams. Women in this family should also have a gynecological exam as recommended by their primary provider. All family members should be referred for colonoscopy starting at age 50, or 66 years younger than the earliest onset of cancer.  Other relatives may benefit from completing their own genetic testing, especially if they have been diagnosed with cancer.   FOLLOW-UP: Cancer genetics is a rapidly advancing field and it is possible that new genetic tests will be appropriate for Hannah Short. Balazs and/or her family members in the future. We encouraged her to remain in  contact with cancer genetics on an annual basis so we can update her personal and family histories and let her know of advances in cancer genetics that may benefit this family.   Our contact number was provided. Hannah Short. [place] questions were answered to her satisfaction, and she knows she is welcome to call us  at anytime with additional questions or concerns.   Burnard Ogren, Hannah Short, [facility] Licensed, [email or web address] [phone]

## 2024-07-20 NOTE — Telephone Encounter (Signed)
 Hannah Short is scheduled for 09/05/24 at 1:00 pm with Dr. Alghanim

## 2024-07-20 NOTE — Telephone Encounter (Signed)
 I spoke to Hannah Short to review results of genetic testing, completed with [facility] Multi-Cancer +RNA panel. Testing did not identify any variants known to increase the risk for cancer, but did identify a variant of unknown significance (VUS) in MET. We discussed that we do not use VUS to make management decisions or identify at risk family members.  Discussed that we do not know why she has cancer or why there is cancer in the family. It is possible that the cancers in history occurred by chance or due to environmental factors. It is possible there are family members that have a variant that she did not inherit. It is also possible there is a hereditary cause for cancer that cannot be identified with current technology or due to a gene that was not tested. It will be important for her to keep in contact with genetics to keep up with whether additional testing may be needed.  Please see counseling note for further detail on this result.

## 2024-07-23 ENCOUNTER — Telehealth: Payer: Self-pay

## 2024-07-23 ENCOUNTER — Other Ambulatory Visit: Payer: Self-pay | Admitting: Gynecologic Oncology

## 2024-07-23 DIAGNOSIS — C569 Malignant neoplasm of unspecified ovary: Secondary | ICD-10-CM

## 2024-07-23 DIAGNOSIS — C579 Malignant neoplasm of female genital organ, unspecified: Secondary | ICD-10-CM

## 2024-07-23 DIAGNOSIS — C763 Malignant neoplasm of pelvis: Secondary | ICD-10-CM

## 2024-07-23 NOTE — Telephone Encounter (Signed)
 Per Hannah Epps NP, order placed for a CT A/P  Hannah Short is scheduled for a CT A/P on Friday 12/26 at 3:00 with arrival time of 1:00 to drink contrast.  Pt. Agrees to date/time

## 2024-07-24 ENCOUNTER — Telehealth: Payer: Self-pay

## 2024-07-24 ENCOUNTER — Telehealth: Payer: Self-pay | Admitting: *Deleted

## 2024-07-24 ENCOUNTER — Ambulatory Visit: Payer: PRIVATE HEALTH INSURANCE

## 2024-07-24 VITALS — BP 148/74 | HR 64 | Temp 98.2°F | Resp 14 | Ht 63.5 in | Wt 191.2 lb

## 2024-07-24 DIAGNOSIS — C579 Malignant neoplasm of female genital organ, unspecified: Secondary | ICD-10-CM | POA: Diagnosis present

## 2024-07-24 DIAGNOSIS — R7689 Other specified abnormal immunological findings in serum: Secondary | ICD-10-CM | POA: Diagnosis present

## 2024-07-24 DIAGNOSIS — R21 Rash and other nonspecific skin eruption: Secondary | ICD-10-CM | POA: Insufficient documentation

## 2024-07-24 NOTE — Telephone Encounter (Signed)
 Spoke with Ms. Siess and relayed message from providers that we have patient tentatively scheduled for surgery with Dr. Viktoria on Tuesday, 12/30. Dr. Eldonna will be back in town on 12/29 and will discuss with Dr. Viktoria and the office will reach back out once we have CT scan results and confirm surgery date and time on Monday 12/29.  Pt verbalized understanding and thanked the office for calling.

## 2024-07-24 NOTE — Telephone Encounter (Signed)
 Pt is aware of message from [facility] NP. She is aware we will call her with Ct results. She will monitor the urinary urgency and pressure and f/u with her PCP for possible UTI

## 2024-07-24 NOTE — Patient Instructions (Signed)
 Please consider Gabapentin with your primary care provider for severe itching from inflammatory rash on bilateral arms.

## 2024-07-24 NOTE — Telephone Encounter (Signed)
 Hannah Short called stating (1) last night she noticed low pelvic pressure like there is a water balloon in my bladder no pain/pressure does have urgency.  (2) Also, the low spine feels like a muscle pulled pulsating feeling. (3) Tenderness on left side about 4 in from the hip bone. She is moving slower. NO fever/chills, normal BM's, passing gas. Eating/drinking fine. She has not done any heavy lifting, pushing,pulling. No strenuous exercise.   She is scheduled for a CT on Friday 12/26, but is going to call today to see if she can get in sooner.

## 2024-07-24 NOTE — Progress Notes (Signed)
 "  Office Visit Note  Patient: Hannah Short             Date of Birth: 19-Nov-1955           MRN: [id number]             PCP: Dow Longs, PA-C Referring: Iva Marty Saltness, MD Visit Date: 07/24/2024 Occupation: Data Unavailable  Subjective:  Pain of the Left Knee, Pain of the Right Knee, and New Patient (Initial Visit) (Patient states she has some rashes and she has had elevated labs. )   Discussed the use of AI scribe software for clinical note transcription with the patient, who gave verbal consent to proceed.  History of Present Illness Kechia Yahnke Asher Babilonia is a 68 year old female who presents with a persistent rash and abnormal labs. She was referred by Dr. Iva for evaluation of an autoimmune disorder.  She has been experiencing a persistent rash since April, initially treated as eczema. A biopsy performed at [facility] was reported as an 'undiagnosed autoimmune disorder response,' according to the patient. Despite using various creams, including steroid creams, the rash has worsened, causing her arms to feel like 'alligator skin' and itch intensely. The rash is primarily located on her arms and is described as having 'white little dots on it. She has tried multiple treatments without relief, and the itching is causing her incredible discomfort.  In November, she was diagnosed with cancer, which was not detected in previous CT scans, mammograms, or colonoscopies conducted in 2024. The cancer was found in her lymph nodes, and she has experienced abdominal bloating since April, coinciding with the onset of the rash. Her abdomen was notably distended. She has a history of ischemic colitis diagnosed in 1993 and has been told she also has diverticulosis.  She reports a history of dry eyes for five to ten years. No new joint pains, but she mentions chronic knee pain and swelling since an incident in 1999. She has been told she might have fibromyalgia and has a family  history of autoimmune diseases, including a sister with lupus. She denies sores in her mouth, no patchy hair loss, and no rashes from sun exposure.  Her social history includes being a mother of five children and having fourteen grandchildren and ten great-grandchildren. .    Activities of Daily Living:  Patient reports morning stiffness for 30-60 minutes.   Patient Denies nocturnal pain.  Difficulty dressing/grooming: Denies Difficulty climbing stairs: Reports Difficulty getting out of chair: Reports Difficulty using hands for taps, buttons, cutlery, and/or writing: Denies  Review of Systems  Constitutional:  Positive for fatigue.  HENT:  Positive for mouth dryness. Negative for mouth sores.   Eyes:  Negative for dryness.  Respiratory:  Positive for shortness of breath.   Cardiovascular:  Negative for chest pain and palpitations.  Gastrointestinal:  Positive for constipation and diarrhea. Negative for blood in stool.  Endocrine: Positive for increased urination.  Genitourinary:  Negative for involuntary urination.  Musculoskeletal:  Positive for joint pain, gait problem, joint pain, joint swelling, myalgias, muscle weakness, morning stiffness, muscle tenderness and myalgias.  Skin:  Positive for color change and rash. Negative for hair loss and sensitivity to sunlight.  Allergic/Immunologic: Negative for susceptible to infections.  Neurological:  Positive for dizziness. Negative for headaches.  Hematological:  Negative for swollen glands.  Psychiatric/Behavioral:  Positive for sleep disturbance. Negative for depressed mood. The patient is not nervous/anxious.     PMFS History:  Patient Active  Problem List   Diagnosis Date Noted   Genetic testing 07/20/2024   Gynecologic malignancy (HCC) 07/17/2024   Mild persistent asthma without complication 05/05/2024   Rash and nonspecific skin eruption 05/05/2024   Abnormal laboratory test 05/05/2024   Essential hypertension 03/04/2020    Morbid obesity (HCC) 03/04/2020   Shortness of breath 11/28/2019   Abdominal pain 11/19/2016   Change in bowel habits 11/19/2016   Atypical chest pain 11/19/2016   Bloating 11/19/2016   Migraine    Fibromyalgia     Past Medical History:  Diagnosis Date   Acute torn meniscus of knee, left, initial encounter 2019   ADHD (attention deficit hyperactivity disorder)    Anemia    Anxiety    Arthritis    Asthma    Back pain    Bulging lumbar disc    Chest pain    Chronically dry eyes, bilateral    Constipation    Depression    Diverticulosis    Fatty liver    Fibromyalgia    Gallstones    GERD (gastroesophageal reflux disease)    Hashimoto's disease    Heart murmur    mitral valve prolapse   Hepatic steatosis    Hiatal hernia    Hypertension    Hypokalemia    Internal hemorrhoids    Joint pain    Lactose intolerance    Lipoma of colon    ??   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   Tubular adenoma of colon     Family History  Problem Relation Age of Onset   Heart failure Mother    Heart failure Father    Colon cancer Father 66   Hypertension Father    Leukemia Paternal Uncle 9   Liver cancer Half-Brother        EtOH   Cancer Half-Brother        maternal, died from cancer (unknown type)   Lung cancer Half-Brother        smoking hx   Breast cancer Half-Sister        paternal, did not die from breast cancer   Ovarian cancer Half-Sister    Stomach cancer Neg Hx    Esophageal cancer Neg Hx    Endometrial cancer Neg Hx    Prostate cancer Neg Hx    Past Surgical History:  Procedure Laterality Date   CESAREAN SECTION     COLONOSCOPY     Dr. Albertus   HERNIA REPAIR  07/2001   LSC ventral hernia repair with mesh (two prior ventral hernia repairs prior to that)   HYSTEROSCOPY W/ ENDOMETRIAL ABLATION  2005   TOE SURGERY  1990    stepped on toothpick   Social History[1] Social History   Social History Narrative   Patient lives at home with her husband Garry) and father in social worker and [place] her daughter.   Patient is taking care of her father full time and disabled daughter.   Education some college.    Right handed.   Caffeine one cup of coffee daily.   Pt states she is increasing her water intake.    Drinks decaf tea.          There is no immunization history on file for this patient.   Objective: Vital Signs: BP ROLLEN)  148/74 (BP Location: Right Arm, Patient Position: Sitting, Cuff Size: Normal)   Pulse 64   Temp 98.2 F (36.8 C)   Resp 14   Ht 5' 3.5 (1.613 m)   Wt 191 lb 3.2 oz (86.7 kg)   BMI 33.34 kg/m    Physical Exam Vitals and nursing note reviewed.  HENT:     Head: Normocephalic and atraumatic.     Nose: Nose normal.  Eyes:     Conjunctiva/sclera: Conjunctivae normal.     Pupils: Pupils are equal, round, and reactive to light.  Pulmonary:     Effort: Pulmonary effort is normal. No respiratory distress.  Skin:    General: Skin is warm and dry.     Comments: Mild erythematous patches b/l forearms  Neurological:     Mental Status: She is alert. Mental status is at baseline.  Psychiatric:        Mood and Affect: Mood normal.        Behavior: Behavior normal.      Musculoskeletal Exam:   CDAI Exam: CDAI Score: -- Patient Global: --; Provider Global: -- Swollen: 0 ; Tender: 0  Joint Exam 07/24/2024   All documented joints were normal     Investigation: No additional findings.  Imaging: No results found.  Recent Labs: Lab Results  Component Value Date   WBC 5.5 07/10/2024   HGB 14.0 07/10/2024   PLT 310 07/10/2024   NA 140 07/10/2024   K 3.0 (L) 07/10/2024   CL 103 07/10/2024   CO2 24 07/10/2024   GLUCOSE 113 (H) 07/10/2024   BUN 14 07/10/2024   CREATININE 0.77 07/10/2024   BILITOT 0.5 07/10/2024   ALKPHOS 89 07/10/2024   AST 25 07/10/2024   ALT 23  07/10/2024   PROT 8.1 07/10/2024   ALBUMIN 4.4 07/10/2024   CALCIUM 9.8 07/10/2024    Speciality Comments: No specialty comments available.  Procedures:  No procedures performed Allergies: Flagyl [metronidazole], Topamax [topiramate], Betanidine [bethanidine], Clonazepam, Tramadol hcl, Betadine [povidone iodine], and Sulfa antibiotics   Assessment / Plan:     Visit Diagnoses:  Positive ANA (antinuclear antibody) Gynecologic malignancy (HCC) Rash and other nonspecific skin eruption Patient with high titer positive ANA (1:1280) and inflammatory rash on b/l forearms. Work-up for inflammatory rash in September revealed high titer positive ANA, as well as positive TPO abs. She was subsequently diagnosed with metastatic cancer 2 months later.   At this time, patient does not have any features of SLE (no objective malar rash, inflammatory arthritis, Raynaud's, alopecia, recurrent cytopenias), Sjogren's disease (no significant sicca symptoms), scleroderma (no sclerodactyly, no Raynaud's). She has a rash on her b/l forearms, however dermatologic biopsy was completely unrevealing.  At this time, I highly suspect that patient's positive ANA, and false positive TPO abs are secondary to her malignancy. As discussed with patient, malignancy is a common cause of positive ANA's, as well as other false positive abs results. Inflammatory rashes are also seen in patient's with malignancy, and is likely the etiology of her abrupt manifestation.  Discussed with patient that a positive ANA need not represent presence of clinically active systemic autoimmune disease.  Can be positive in the normal population, autoimmune thyroid  disease (Graves' disease, Hashimoto's thyroiditis etc.), or infection. ANA can be positive in the healthy population at the following rates: ANA 1:40: 20%-30%, ANA 1:80: 10%-15%, ANA 1:160: 5%, ANA 1:320: 3% positive, healthy relative of an SLE patient: 5%-25% positive (usually low titers),  and elderly (age >70 years): up to  70% positive at ANA titer 1:40.   Advised patient that I am hesitant to check any labs today given high probability of false positive test results, such as with the false positive TPO abs. Advised patient that malignancy can lead to false positive results and could lead to an incorrect diagnosis. As discussed with patient, will follow-up with patient after her upcoming procedure and/or chemotherapy to see if lab testing would be appropriate.   In terms of her rash, a biopsy would typically be very beneficial to determine the etiology, however the results appear to be very non-specific. Suspect that it is more likely due to her newly diagnosed malignancy. Discussed with patient that medications such as Gabapentin may be beneficial to help with severe itching. Advised patient to discuss initiating therapy with her PCP. Patient verbalized understanding.  Orders: No orders of the defined types were placed in this encounter.  No orders of the defined types were placed in this encounter.   I personally spent a total of 60 minutes in the care of the patient today including preparing to see the patient, getting/reviewing separately obtained history, performing a medically appropriate exam/evaluation, counseling and educating, documenting clinical information in the EHR, independently interpreting results, and communicating results.   Follow-Up Instructions: Return in about 3 months (around 10/22/2024).   Asberry Claw, DO  Note - This record has been created using Animal nutritionist.  Chart creation errors have been sought, but may not always  have been located. Such creation errors do not reflect on  the standard of medical care.     [1]  Social History Tobacco Use   Smoking status: Never    Passive exposure: Past   Smokeless tobacco: Never  Vaping Use   Vaping status: Never Used  Substance Use Topics   Alcohol use: No   Drug use: No   "

## 2024-07-27 ENCOUNTER — Other Ambulatory Visit: Payer: Self-pay | Admitting: Oncology

## 2024-07-27 ENCOUNTER — Ambulatory Visit (HOSPITAL_BASED_OUTPATIENT_CLINIC_OR_DEPARTMENT_OTHER)
Admission: RE | Admit: 2024-07-27 | Discharge: 2024-07-27 | Disposition: A | Discharge status: Home / Self Care | Source: Ambulatory Visit | Attending: Gynecologic Oncology | Admitting: Gynecologic Oncology

## 2024-07-27 DIAGNOSIS — C569 Malignant neoplasm of unspecified ovary: Secondary | ICD-10-CM | POA: Insufficient documentation

## 2024-07-27 DIAGNOSIS — N3090 Cystitis, unspecified without hematuria: Secondary | ICD-10-CM

## 2024-07-27 DIAGNOSIS — C579 Malignant neoplasm of female genital organ, unspecified: Secondary | ICD-10-CM | POA: Diagnosis present

## 2024-07-27 DIAGNOSIS — C763 Malignant neoplasm of pelvis: Secondary | ICD-10-CM | POA: Diagnosis present

## 2024-07-27 HISTORY — DX: Cystitis, unspecified without hematuria: N30.90

## 2024-07-27 MED ORDER — CIPROFLOXACIN HCL 500 MG PO TABS: 500.0000 mg | ORAL_TABLET | Freq: Two times a day (BID) | ORAL | 0 refills | Status: DC

## 2024-07-27 MED ORDER — IOHEXOL 300 MG/ML  SOLN
100.0000 mL | Freq: Once | INTRAMUSCULAR | Status: AC | PRN
  Administered 2024-07-27: 100 mL via INTRAVENOUS

## 2024-07-27 NOTE — Progress Notes (Signed)
 This patient was seen in GYN oncology clinic by Dr. Hoy Masters.  She had a CT abdomen and pelvis with contrast earlier today and somehow I got called with the results.  I ended up noting that on the findings including findings consistent with cystitis, bilateral urothelial thickening and urinary bladder wall thickening and mucosal hyperemia.  I called the patient and she did report dysuria, hematuria.  She is allergic to several medications.  She did tolerate ciprofloxacin  well previously.  Hence I sent a prescription for ciprofloxacin  500 mg p.o. twice daily for 10-day supply to her pharmacy.  She is scheduled to undergo surgery on Tuesday, 07/31/2024 with Dr. Viktoria  Informed the patient that I will forward the notes from today to Dr. Lewie office and expect a call from them on Monday, 07/30/2024 regarding further directions.  I will be out of office starting 07/30/2024 until 08/05/2024.  Chinita Patten, MD

## 2024-07-27 NOTE — Progress Notes (Signed)
 Sent message, via epic in basket, requesting orders in epic from Careers adviser.

## 2024-07-30 ENCOUNTER — Telehealth: Payer: Self-pay | Admitting: Oncology

## 2024-07-30 ENCOUNTER — Encounter (HOSPITAL_COMMUNITY): Payer: Self-pay | Admitting: Gynecologic Oncology

## 2024-07-30 ENCOUNTER — Telehealth: Payer: Self-pay | Admitting: *Deleted

## 2024-07-30 NOTE — Progress Notes (Signed)
 Updated date of surgery: 07/31/24  Updated time of arrival: 515 AM  Patient denies any changes in allergies, medications, medical history since pre op appointment on: is currently being treated with Cipro  for Bladder Infection.  Tested positive for COVID 07/19/24 patient states no symptoms at this time.  Pre op instructions reviewed, follow up questions addressed and patient verbalized understanding at this time.

## 2024-07-30 NOTE — Telephone Encounter (Signed)
 Hannah Short left a message to find out what time to arrive for surgery.  Called her back and she knows to arrive at 5:15 am to [facility].

## 2024-07-30 NOTE — Telephone Encounter (Signed)
 Telephone call to check on pre-operative status.  Patient compliant with pre-operative instructions.  Reinforced nothing to eat after midnight. Clear liquids until 0415. Patient to arrive at 0515.  No questions or concerns voiced.  Instructed to call for any needs.

## 2024-07-31 ENCOUNTER — Encounter (HOSPITAL_COMMUNITY): Payer: Self-pay | Admitting: Anesthesiology

## 2024-07-31 ENCOUNTER — Other Ambulatory Visit: Payer: Self-pay

## 2024-07-31 ENCOUNTER — Ambulatory Visit (HOSPITAL_COMMUNITY)
Admission: RE | Admit: 2024-07-31 | Discharge: 2024-08-01 | Disposition: A | Discharge status: Home / Self Care | Attending: Gynecologic Oncology | Admitting: Gynecologic Oncology

## 2024-07-31 ENCOUNTER — Encounter (HOSPITAL_COMMUNITY): Payer: Self-pay | Admitting: Gynecologic Oncology

## 2024-07-31 ENCOUNTER — Encounter (HOSPITAL_COMMUNITY)
Admission: RE | Disposition: A | Discharge status: Home / Self Care | Payer: Self-pay | Source: Home / Self Care | Attending: Gynecologic Oncology

## 2024-07-31 ENCOUNTER — Inpatient Hospital Stay (HOSPITAL_COMMUNITY): Payer: Self-pay | Admitting: Anesthesiology

## 2024-07-31 DIAGNOSIS — Z803 Family history of malignant neoplasm of breast: Secondary | ICD-10-CM | POA: Insufficient documentation

## 2024-07-31 DIAGNOSIS — I340 Nonrheumatic mitral (valve) insufficiency: Secondary | ICD-10-CM | POA: Insufficient documentation

## 2024-07-31 DIAGNOSIS — Z8041 Family history of malignant neoplasm of ovary: Secondary | ICD-10-CM | POA: Insufficient documentation

## 2024-07-31 DIAGNOSIS — F32A Depression, unspecified: Secondary | ICD-10-CM | POA: Insufficient documentation

## 2024-07-31 DIAGNOSIS — R0602 Shortness of breath: Secondary | ICD-10-CM | POA: Diagnosis not present

## 2024-07-31 DIAGNOSIS — M1990 Unspecified osteoarthritis, unspecified site: Secondary | ICD-10-CM | POA: Diagnosis not present

## 2024-07-31 DIAGNOSIS — Z7951 Long term (current) use of inhaled steroids: Secondary | ICD-10-CM | POA: Insufficient documentation

## 2024-07-31 DIAGNOSIS — K660 Peritoneal adhesions (postprocedural) (postinfection): Secondary | ICD-10-CM | POA: Insufficient documentation

## 2024-07-31 DIAGNOSIS — M797 Fibromyalgia: Secondary | ICD-10-CM | POA: Insufficient documentation

## 2024-07-31 DIAGNOSIS — C55 Malignant neoplasm of uterus, part unspecified: Secondary | ICD-10-CM | POA: Diagnosis present

## 2024-07-31 DIAGNOSIS — J45909 Unspecified asthma, uncomplicated: Secondary | ICD-10-CM | POA: Insufficient documentation

## 2024-07-31 DIAGNOSIS — C541 Malignant neoplasm of endometrium: Secondary | ICD-10-CM | POA: Insufficient documentation

## 2024-07-31 DIAGNOSIS — I10 Essential (primary) hypertension: Secondary | ICD-10-CM | POA: Diagnosis not present

## 2024-07-31 DIAGNOSIS — K449 Diaphragmatic hernia without obstruction or gangrene: Secondary | ICD-10-CM | POA: Diagnosis not present

## 2024-07-31 DIAGNOSIS — K219 Gastro-esophageal reflux disease without esophagitis: Secondary | ICD-10-CM | POA: Diagnosis not present

## 2024-07-31 DIAGNOSIS — N888 Other specified noninflammatory disorders of cervix uteri: Secondary | ICD-10-CM | POA: Diagnosis not present

## 2024-07-31 DIAGNOSIS — R590 Localized enlarged lymph nodes: Secondary | ICD-10-CM | POA: Diagnosis not present

## 2024-07-31 DIAGNOSIS — F419 Anxiety disorder, unspecified: Secondary | ICD-10-CM | POA: Insufficient documentation

## 2024-07-31 DIAGNOSIS — C775 Secondary and unspecified malignant neoplasm of intrapelvic lymph nodes: Secondary | ICD-10-CM | POA: Diagnosis not present

## 2024-07-31 DIAGNOSIS — D649 Anemia, unspecified: Secondary | ICD-10-CM | POA: Insufficient documentation

## 2024-07-31 DIAGNOSIS — R519 Headache, unspecified: Secondary | ICD-10-CM | POA: Insufficient documentation

## 2024-07-31 DIAGNOSIS — C579 Malignant neoplasm of female genital organ, unspecified: Principal | ICD-10-CM | POA: Diagnosis present

## 2024-07-31 HISTORY — PX: CYSTOSCOPY: SHX5120

## 2024-07-31 HISTORY — PX: ROBOTIC ASSISTED TOTAL HYSTERECTOMY WITH BILATERAL SALPINGO OOPHERECTOMY: SHX6086

## 2024-07-31 LAB — TYPE AND SCREEN
ABO/RH(D): O POS
Antibody Screen: NEGATIVE

## 2024-07-31 MED ORDER — ACETAMINOPHEN 500 MG PO TABS
ORAL_TABLET | ORAL | Status: AC
  Filled 2024-07-31: qty 2

## 2024-07-31 MED ORDER — SIMETHICONE 80 MG PO CHEW
80.0000 mg | CHEWABLE_TABLET | Freq: Four times a day (QID) | ORAL | Status: DC | PRN
  Administered 2024-07-31 – 2024-08-01 (×3): 80 mg via ORAL
  Filled 2024-07-31 (×3): qty 1

## 2024-07-31 MED ORDER — OXYCODONE HCL 5 MG/5ML PO SOLN: 5.0000 mg | Freq: Once | ORAL | Status: DC | PRN

## 2024-07-31 MED ORDER — LIDOCAINE HCL (CARDIAC) PF 100 MG/5ML IV SOSY
PREFILLED_SYRINGE | INTRAVENOUS | Status: DC | PRN
  Administered 2024-07-31: 100 mg via INTRAVENOUS

## 2024-07-31 MED ORDER — CHLORHEXIDINE GLUCONATE 0.12 % MT SOLN
15.0000 mL | Freq: Once | OROMUCOSAL | Status: AC
  Administered 2024-07-31: 15 mL via OROMUCOSAL

## 2024-07-31 MED ORDER — LORATADINE 10 MG PO TABS: 10.0000 mg | ORAL_TABLET | Freq: Every day | ORAL | Status: DC | PRN

## 2024-07-31 MED ORDER — FENTANYL CITRATE (PF) 250 MCG/5ML IJ SOLN
INTRAMUSCULAR | Status: AC
  Filled 2024-07-31: qty 5

## 2024-07-31 MED ORDER — ROCURONIUM BROMIDE 10 MG/ML (PF) SYRINGE
PREFILLED_SYRINGE | INTRAVENOUS | Status: DC | PRN
  Administered 2024-07-31: 20 mg via INTRAVENOUS
  Administered 2024-07-31: 5 mg via INTRAVENOUS
  Administered 2024-07-31: 80 mg via INTRAVENOUS
  Administered 2024-07-31: 20 mg via INTRAVENOUS

## 2024-07-31 MED ORDER — KETAMINE HCL 50 MG/5ML IJ SOSY
PREFILLED_SYRINGE | INTRAMUSCULAR | Status: AC
  Filled 2024-07-31: qty 5

## 2024-07-31 MED ORDER — KETAMINE HCL 50 MG/5ML IJ SOSY
PREFILLED_SYRINGE | INTRAMUSCULAR | Status: DC | PRN
  Administered 2024-07-31 (×2): 10 mg via INTRAVENOUS
  Administered 2024-07-31: 30 mg via INTRAVENOUS

## 2024-07-31 MED ORDER — ONDANSETRON HCL 4 MG/2ML IJ SOLN
INTRAMUSCULAR | Status: DC | PRN
  Administered 2024-07-31: 4 mg via INTRAVENOUS

## 2024-07-31 MED ORDER — ORAL CARE MOUTH RINSE: 15.0000 mL | Freq: Once | OROMUCOSAL | Status: AC

## 2024-07-31 MED ORDER — FAMOTIDINE 20 MG PO TABS
40.0000 mg | ORAL_TABLET | Freq: Every morning | ORAL | Status: DC
  Administered 2024-08-01: 40 mg via ORAL
  Filled 2024-07-31: qty 2

## 2024-07-31 MED ORDER — LACTATED RINGERS IR SOLN
Status: DC | PRN
  Administered 2024-07-31: 1000 mL

## 2024-07-31 MED ORDER — ONDANSETRON HCL 4 MG/2ML IJ SOLN
INTRAMUSCULAR | Status: AC
  Filled 2024-07-31: qty 2

## 2024-07-31 MED ORDER — SODIUM CHLORIDE (PF) 0.9 % IJ SOLN
INTRAMUSCULAR | Status: AC
  Filled 2024-07-31: qty 10

## 2024-07-31 MED ORDER — PROPOFOL 10 MG/ML IV BOLUS
INTRAVENOUS | Status: DC | PRN
  Administered 2024-07-31: 130 mg via INTRAVENOUS

## 2024-07-31 MED ORDER — ARFORMOTEROL TARTRATE 15 MCG/2ML IN NEBU
15.0000 ug | INHALATION_SOLUTION | Freq: Two times a day (BID) | RESPIRATORY_TRACT | Status: DC
  Administered 2024-07-31 – 2024-08-01 (×2): 15 ug via RESPIRATORY_TRACT
  Filled 2024-07-31 (×2): qty 2

## 2024-07-31 MED ORDER — OXYCODONE HCL 5 MG PO TABS
5.0000 mg | ORAL_TABLET | ORAL | Status: DC | PRN
  Administered 2024-07-31 (×2): 10 mg via ORAL
  Administered 2024-07-31: 5 mg via ORAL
  Administered 2024-08-01: 10 mg via ORAL
  Administered 2024-08-01 (×3): 5 mg via ORAL
  Filled 2024-07-31 (×3): qty 2
  Filled 2024-07-31: qty 1
  Filled 2024-07-31 (×2): qty 2

## 2024-07-31 MED ORDER — ALBUTEROL SULFATE (2.5 MG/3ML) 0.083% IN NEBU: 3.0000 mL | INHALATION_SOLUTION | Freq: Four times a day (QID) | RESPIRATORY_TRACT | Status: DC | PRN

## 2024-07-31 MED ORDER — BUPIVACAINE HCL 0.25 % IJ SOLN
INTRAMUSCULAR | Status: DC | PRN
  Administered 2024-07-31: 30 mL

## 2024-07-31 MED ORDER — HYDROMORPHONE HCL 1 MG/ML IJ SOLN: 0.2500 mg | INTRAMUSCULAR | Status: DC | PRN

## 2024-07-31 MED ORDER — ROCURONIUM BROMIDE 10 MG/ML (PF) SYRINGE
PREFILLED_SYRINGE | INTRAVENOUS | Status: AC
  Filled 2024-07-31: qty 10

## 2024-07-31 MED ORDER — BUDESONIDE 0.5 MG/2ML IN SUSP
0.5000 mg | Freq: Two times a day (BID) | RESPIRATORY_TRACT | Status: DC
  Administered 2024-07-31 – 2024-08-01 (×2): 0.5 mg via RESPIRATORY_TRACT
  Filled 2024-07-31 (×2): qty 2

## 2024-07-31 MED ORDER — FENTANYL CITRATE (PF) 50 MCG/ML IJ SOSY: 25.0000 ug | PREFILLED_SYRINGE | INTRAMUSCULAR | Status: DC | PRN

## 2024-07-31 MED ORDER — ENOXAPARIN SODIUM 40 MG/0.4ML IJ SOSY
40.0000 mg | PREFILLED_SYRINGE | INTRAMUSCULAR | Status: DC
  Administered 2024-08-01: 40 mg via SUBCUTANEOUS
  Filled 2024-07-31: qty 0.4

## 2024-07-31 MED ORDER — HYDROMORPHONE HCL 1 MG/ML IJ SOLN
INTRAMUSCULAR | Status: DC | PRN
  Administered 2024-07-31 (×2): .4 mg via INTRAVENOUS

## 2024-07-31 MED ORDER — ACETAMINOPHEN 500 MG PO TABS
1000.0000 mg | ORAL_TABLET | Freq: Four times a day (QID) | ORAL | Status: DC
  Administered 2024-07-31 – 2024-08-01 (×5): 1000 mg via ORAL
  Filled 2024-07-31 (×4): qty 2

## 2024-07-31 MED ORDER — ONDANSETRON HCL 4 MG PO TABS
4.0000 mg | ORAL_TABLET | Freq: Four times a day (QID) | ORAL | Status: DC | PRN
  Administered 2024-08-01: 4 mg via ORAL
  Filled 2024-07-31: qty 1

## 2024-07-31 MED ORDER — ONDANSETRON HCL 4 MG/2ML IJ SOLN
4.0000 mg | Freq: Four times a day (QID) | INTRAMUSCULAR | Status: DC | PRN
  Administered 2024-07-31 (×2): 4 mg via INTRAVENOUS
  Filled 2024-07-31: qty 2

## 2024-07-31 MED ORDER — BUPIVACAINE HCL (PF) 0.25 % IJ SOLN
INTRAMUSCULAR | Status: AC
  Filled 2024-07-31: qty 30

## 2024-07-31 MED ORDER — DEXAMETHASONE SOD PHOSPHATE PF 10 MG/ML IJ SOLN: 4.0000 mg | INTRAMUSCULAR | Status: DC

## 2024-07-31 MED ORDER — CIPROFLOXACIN HCL 500 MG PO TABS
500.0000 mg | ORAL_TABLET | Freq: Two times a day (BID) | ORAL | Status: DC
  Administered 2024-08-01: 500 mg via ORAL
  Filled 2024-07-31: qty 1

## 2024-07-31 MED ORDER — LACTATED RINGERS IV SOLN: INTRAVENOUS | Status: DC | PRN

## 2024-07-31 MED ORDER — SODIUM CHLORIDE 0.9 % IV SOLN
2.0000 g | Freq: Once | INTRAVENOUS | Status: DC
  Filled 2024-07-31: qty 2

## 2024-07-31 MED ORDER — PLECANATIDE 3 MG PO TABS: 3.0000 mg | ORAL_TABLET | Freq: Every morning | ORAL | Status: DC

## 2024-07-31 MED ORDER — KCL IN DEXTROSE-NACL 20-5-0.45 MEQ/L-%-% IV SOLN
INTRAVENOUS | Status: AC
  Filled 2024-07-31 (×2): qty 1000

## 2024-07-31 MED ORDER — OXYCODONE HCL 5 MG PO TABS
ORAL_TABLET | ORAL | Status: AC
  Filled 2024-07-31: qty 1

## 2024-07-31 MED ORDER — ACETAMINOPHEN 10 MG/ML IV SOLN: 1000.0000 mg | Freq: Once | INTRAVENOUS | Status: DC | PRN

## 2024-07-31 MED ORDER — SUGAMMADEX SODIUM 200 MG/2ML IV SOLN
INTRAVENOUS | Status: DC | PRN
  Administered 2024-07-31: 200 mg via INTRAVENOUS

## 2024-07-31 MED ORDER — OXYCODONE HCL 5 MG PO TABS: 5.0000 mg | ORAL_TABLET | Freq: Once | ORAL | Status: DC | PRN

## 2024-07-31 MED ORDER — MIDAZOLAM HCL 2 MG/2ML IJ SOLN
INTRAMUSCULAR | Status: AC
  Filled 2024-07-31: qty 2

## 2024-07-31 MED ORDER — FENTANYL CITRATE (PF) 250 MCG/5ML IJ SOLN
INTRAMUSCULAR | Status: DC | PRN
  Administered 2024-07-31: 100 ug via INTRAVENOUS
  Administered 2024-07-31 (×3): 50 ug via INTRAVENOUS

## 2024-07-31 MED ORDER — SODIUM CHLORIDE 0.9 % IV SOLN
2.0000 g | INTRAVENOUS | Status: AC
  Administered 2024-07-31 (×2): 2 g via INTRAVENOUS
  Filled 2024-07-31: qty 2

## 2024-07-31 MED ORDER — SODIUM CHLORIDE (PF) 0.9 % IJ SOLN
INTRAMUSCULAR | Status: AC
  Filled 2024-07-31: qty 20

## 2024-07-31 MED ORDER — DEXAMETHASONE SOD PHOSPHATE PF 10 MG/ML IJ SOLN
INTRAMUSCULAR | Status: DC | PRN
  Administered 2024-07-31: 10 mg via INTRAVENOUS

## 2024-07-31 MED ORDER — LACTATED RINGERS IV SOLN: INTRAVENOUS | Status: DC

## 2024-07-31 MED ORDER — ONDANSETRON HCL 4 MG/2ML IJ SOLN: 4.0000 mg | Freq: Once | INTRAMUSCULAR | Status: DC | PRN

## 2024-07-31 MED ORDER — HYDROMORPHONE HCL 2 MG/ML IJ SOLN
INTRAMUSCULAR | Status: AC
  Filled 2024-07-31: qty 1

## 2024-07-31 MED ORDER — PROPOFOL 10 MG/ML IV BOLUS
INTRAVENOUS | Status: AC
  Filled 2024-07-31: qty 20

## 2024-07-31 MED ORDER — HEPARIN SODIUM (PORCINE) 5000 UNIT/ML IJ SOLN
5000.0000 [IU] | INTRAMUSCULAR | Status: AC
  Administered 2024-07-31: 5000 [IU] via SUBCUTANEOUS
  Filled 2024-07-31: qty 1

## 2024-07-31 MED ORDER — MIDAZOLAM HCL (PF) 2 MG/2ML IJ SOLN
INTRAMUSCULAR | Status: DC | PRN
  Administered 2024-07-31: 2 mg via INTRAVENOUS

## 2024-07-31 MED ORDER — LIDOCAINE HCL (PF) 2 % IJ SOLN
INTRAMUSCULAR | Status: AC
  Filled 2024-07-31: qty 5

## 2024-07-31 MED ORDER — PANTOPRAZOLE SODIUM 40 MG PO TBEC
40.0000 mg | DELAYED_RELEASE_TABLET | Freq: Every day | ORAL | Status: DC
  Administered 2024-08-01: 40 mg via ORAL
  Filled 2024-07-31: qty 1

## 2024-07-31 MED ORDER — ACETAMINOPHEN 500 MG PO TABS
1000.0000 mg | ORAL_TABLET | ORAL | Status: AC
  Administered 2024-07-31: 1000 mg via ORAL
  Filled 2024-07-31: qty 2

## 2024-07-31 NOTE — Anesthesia Postprocedure Evaluation (Signed)
"   Anesthesia Post Note  Patient: Hannah Short  Procedure(s) Performed: HYSTERECTOMY, TOTAL, ROBOT-ASSISTED, LAPAROSCOPIC, WITH BILATERAL SALPINGO-OOPHORECTOMY (Bilateral) LYMPHADENECTOMY, PELVIS, ROBOT-ASSISTED CYSTOSCOPY (Bladder)     Patient location during evaluation: PACU Anesthesia Type: General Level of consciousness: awake and alert Pain management: pain level controlled Vital Signs Assessment: post-procedure vital signs reviewed and stable Respiratory status: spontaneous breathing, nonlabored ventilation, respiratory function stable and patient connected to nasal cannula oxygen Cardiovascular status: blood pressure returned to baseline and stable Postop Assessment: no apparent nausea or vomiting Anesthetic complications: no   No notable events documented.  Last Vitals:  Vitals:   07/31/24 1300 07/31/24 1408  BP: 137/78 121/89  Pulse: 83 87  Resp: (!) 24   Temp:    SpO2: 95% 93%    Last Pain:  Vitals:   07/31/24 1312  TempSrc:   PainSc: 4                  Hannah Short      "

## 2024-07-31 NOTE — Op Note (Signed)
 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   Anesthesia: GET  Urine Output: 500 cc  Operative Findings: On EUA, small mobile uterus, no adnexal masses appreciated.  On intra-abdominal entry, adhesions of the omentum to the anterior abdominal wall along the midline from the level of the umbilicus to the xiphoid process consistent with prior hernia repairs.  Although entry with a 5 mm trocar was within the area of omental adhesions, no obvious injury noted either on initial inspection or subsequently after robotic lysis of adhesions was performed to take down the omentum adherent along the left upper abdomen and around the left upper quadrant incision/trocar.  Normal-appearing small and large bowel.  No peritoneal lesions.  Uterus approximately 8 cm and normal in appearance.  Somewhat atrophic bilateral ovaries, minimally atypical versus atrophic fallopian tubes bilaterally.  No ascites.  On inspection of bilateral pelvic sidewalls, enlarged right external iliac lymph nodes as well as a right obturator lymph node.  No significantly enlarged left pelvic lymph nodes.  Estimated Blood Loss:  75 cc      Total IV Fluids: see I&O flowsheet         Specimens: uterus, cervix, bilateral tubes and ovaries, pelvic washings, right external iliac lymph nodes, right obturator lymph nodes, left pelvic lymph nodes         Complications:  None  apparent; patient tolerated the procedure well.         Disposition: PACU - hemodynamically stable.  Procedure Details  The patient was seen in the Holding Room. The risks, benefits, complications, treatment options, and expected outcomes were discussed with the patient.  The patient concurred with the proposed plan, giving informed consent.  The site of surgery properly noted/marked. The patient was identified as Hannah Short and the procedure verified as a Robotic-assisted hysterectomy with bilateral salpingo oophorectomy with pelvic lymph node debulking, any other indicated procedures.  After induction of anesthesia, the patient was draped and prepped in the usual sterile manner. Patient was placed in supine position after anesthesia and draped and prepped in the usual sterile manner as follows: Her arms were tucked to her side with all appropriate precautions.  The patient was secured to the bed using padding and tape across her chest.  The patient was placed in the semi-lithotomy position in Lockney stirrups.  The perineum and vagina were prepped with CHG. The patient's abdomen was prepped with ChloraPrep and she was draped after the prep had been allowed to dry for 3 minutes.  A Time Out was held and the above information confirmed.  The urethra was prepped with CHG. Foley catheter was placed.  A sterile speculum was placed in the vagina.  The cervix was grasped with a single-tooth tenaculum. The ZUMI uterine manipulator with a medium colpotomizer ring was placed without difficulty.  A pneum occluder balloon was placed over the manipulator.  OG tube placement was confirmed and to suction.   Next, a 5 mm skin incision was made 1 cm below the subcostal margin in the midclavicular line.  The 5 mm Optiview port and scope was used for  direct entry.  Opening pressure was under 10 mm CO2.  The abdomen was insufflated and the findings were noted as above.  Upon initial placement of the trocar, placement with  in an adhesion was noted.  I was able to find a clear space and direct the camera through this area to exit the omental adhesion.  At this point and all points during the procedure, the patient's intra-abdominal pressure did not exceed 15 mmHg. Next, an 8 mm skin incision was made inferior to the umbilicus and a right and left port were placed about 8 cm lateral to the robot port on the right and left side.  A fourth arm was placed on the right.  The 5 mm assist trocar was left in place and an incision in the left mid abdomen below the level of the omental adhesion was made to allow for placement of a 12 mm air seal port. All ports were placed under direct visualization.  The patient was placed in steep Trendelenburg.  The robot was docked in the normal manner.  Pelvic washings were collected.  The right and left peritoneum were opened parallel to the IP ligament to open the retroperitoneal spaces bilaterally. The round ligaments were transected.   After inspecting the retroperitoneum on the right, multiple enlarged lymph nodes were noted along the external iliac vessel and within the obturator space.  A pelvic lymph dissection was performed on the right to remove all enlarged lymph nodes with the following borders: proximally the bifurcation of the common iliac, distally the circumflex iliac vein, laterally the genitofemoral nerve, the medial border was the superior vesicle artery and the deep border was the obturator nerve. All lymphatic tissue was removed and sent to Pathology. A similar procedure was performed on the contralateral side.  Although no pathologic lymph nodes were noted on the left, multiple lymph nodes from along the external iliac vessels and within the obturator space were removed and sent to pathology.  The hysterectomy was started.  The ureter was again noted to be on the medial leaf of the broad ligament.  The peritoneum above the ureter was incised and stretched and the  infundibulopelvic ligament was skeletonized, cauterized and cut.  The posterior peritoneum was taken down to the level of the KOH ring.  The anterior peritoneum was also taken down.  The bladder flap was created to the level of the KOH ring.  This was done with some difficulty given somewhat ill-defined plane between the bladder and the cervix.  The uterine artery on the right side was skeletonized, cauterized and cut in the normal manner.  A similar procedure was performed on the left.  The colpotomy was made and the uterus, cervix, bilateral ovaries and tubes were amputated and delivered through the vagina.  Pedicles were inspected and excellent hemostasis was achieved.    The colpotomy at the vaginal cuff was closed with 0 Vicryl with a figure-of-eight at each apex and 0 STRATAFIX to close the midportion of the cuff in 2 layers in a running manner.  Irrigation was used and excellent hemostasis was achieved.  At this point in the procedure was completed.    Bladder was backfilled with 200 cc of sterile fluid. Foley catheter was removed. Cystoscopy was performed with findings noted above.  At this point, robotic instruments were removed the robot was undocked and the patient placed in 20 degrees of Trendelenburg.  The robot was then redocked for upper abdominal work.  Using a combination of blunt  dissection and short burst of monopolar electrocautery, adhesions of the omentum were lysed along the left aspect of the abdominal wall to free the omentum up to and above the level of the 5 mm trocar.  The area inferior was inspected within the omentum and no injury to bowel noted.  The transverse colon was well-visualized and I confirmed that there was no small bowel within the adhesions against the anterior abdominal wall.  All robotic instruments were removed and the robot was undocked.  The patient was taken out of Trendelenburg.  The 12 mm port was closed with 0 Vicryl using a PMI fascial closure device under  direct visualization.  The subcuticular tissue was closed with 4-0 Vicryl and the skin was closed with 4-0 Monocryl in a subcuticular manner.  Dermabond was applied.    The vagina was swabbed with minimal bleeding noted. All sponge, lap and needle counts were correct x  3.   The patient was transferred to the recovery room in stable condition.  Comer Dollar, MD

## 2024-07-31 NOTE — H&P (Signed)
 Gynecologic Oncology H&P  Treatment History: Patient presented to GI on 05/16/2024 for severe bloating and weight gain.  She also noted that about 2 weeks prior her bowel habits have changed and went from mushy bowel 3-4 times a day to straining for stools which are soft and formed.  Also noted to a lot of gas.  She had previously undergone colonoscopy in June 2023 and subsequently in May 2024 with the most recent colonoscopy revealing diverticulosis in the sigmoid and hemorrhoids.  Given her symptoms she underwent a CT abdomen/pelvis on 05/24/2024 which noted new pelvic lymphadenopathy in the right external iliac chain, no ascites no reported abnormality of the reproductive organs.  A subsequent CT chest in 06/04/2024 noted ground glass attenuation in the lungs suggestive of small airway disease and multiple scattered pulmonary nodules, grossly stable to prior, likely benign.  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  She also noted weight gain, change in her bowel movements with pooping on the time after eating and decreased p.o. intake due to feeling full.  Denies any change in her urination.  Also reports upper abdominal pain that she describes as twisting in nature.   Patient otherwise is followed by Marty Shaggy, MD and Arlean Mutter, FNP with [facility] for asthma, last seen on 06/20/2024.  She is on budesonide  nebulizer and albuterol  as needed.  She also follows with Dr. Dorn Lesches, [facility], last seen on 05/09/24.  She had an echo on 06/20/2024 which showed EF 55-60%, Grade 1 diastolic dysfunction, mild mitral valve regurg.   Patient has a history of prior ventral hernias that were repaired multiple times, most recently  in 07/2001 with a laparoscopic repair with mesh.   Patient also reports two paternal half sisters with breast and ovarian cancer, no reported genetic testing.  The patient's surgery had to be rescheduled several times.  07/27/24: CT A/P 1. Bilateral urothelial thickening and suggestion of urinary bladder wall thickening and mucosal hyperemia suggestive of infection. Correlate with urinalysis. PRA to call report. 2. Stable right pelvic sidewall 1.4 cm enlarged lymph node. Stable to slightly increased in size right cervical lymph node measuring 1.3 cm (from 1.1 cm). 3. Cholelithiasis with no CT evidence of acute cholecystitis. 4. Other, non-acute and/or normal findings as above.  Interval History: Doing well.  Past Medical/Surgical History: Past Medical History:  Diagnosis Date   Acute torn meniscus of knee, left, initial encounter 2019   ADHD (attention deficit hyperactivity disorder)    Anemia    Anxiety    Arthritis    Asthma    Back pain    Bladder infection 07/27/2024   Bulging lumbar disc    Chest pain    Chronically dry eyes, bilateral    Constipation    Depression    Diverticulosis    Fatty liver    Fibromyalgia    Gallstones    GERD (gastroesophageal reflux disease)    Hashimoto's disease    Heart murmur    mitral valve prolapse   Hepatic steatosis    Hiatal hernia    Hypertension    Hypokalemia    Internal hemorrhoids    Joint pain    Lactose intolerance    Lipoma of colon    ??   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 Albertus   HERNIA REPAIR  07/2001   LSC ventral hernia repair with mesh (two prior ventral hernia repairs prior to that)   HYSTEROSCOPY W/ ENDOMETRIAL ABLATION  2005   TOE SURGERY   1990   stepped on toothpick    Family History  Problem Relation Age of Onset   Heart failure Mother    Heart failure Father    Colon cancer Father 69   Hypertension Father    Leukemia Paternal Uncle 45   Liver cancer Half-Brother        EtOH   Cancer Half-Brother        maternal, died from cancer (unknown type)   Lung cancer Half-Brother        smoking hx   Breast cancer Half-Sister        paternal, did not die from breast cancer   Ovarian cancer Half-Sister    Stomach cancer Neg Hx    Esophageal cancer Neg Hx    Endometrial cancer Neg Hx    Prostate cancer Neg Hx     Social History   Socioeconomic History   Marital status: Married    Spouse name: [name]   Number of children: 5   Years of education: 12   Highest education level: Not on file  Occupational History   Occupation: CNA  Tobacco Use   Smoking status: Never    Passive exposure: Past   Smokeless tobacco: Never  Vaping Use   Vaping status: Never Used  Substance and Sexual Activity   Alcohol use: No   Drug use: No   Sexual activity: Not Currently    Partners: Male  Other Topics Concern   Not on file  Social History Narrative   Patient lives at home with her husband Garry) and father in social worker and Boyce her daughter.   Patient is taking care of her father full time and disabled daughter.   Education some college.    Right handed.   Caffeine one cup of coffee daily.   Pt states she is increasing her water intake.    Drinks decaf tea.       Social Drivers of Health   Tobacco Use: Low Risk (07/31/2024)   Patient History    Smoking Tobacco Use: Never    Smokeless Tobacco Use: Never    Passive Exposure: Past  Financial Resource Strain: Not on file  Food Insecurity: Unknown (07/05/2024)   Epic    Worried About Programme Researcher, Broadcasting/film/video in the Last Year: Not on file    [employer] of Food in the Last Year: Never true  Transportation Needs: No Transportation Needs (07/05/2024)   Epic    Lack of  Transportation (Medical): No    Lack of Transportation (Non-Medical): No  Physical Activity: Not on file  Stress: Not on file  Social Connections: Not on file  Depression (EYV7-0): Not on file  Alcohol Screen: Not on file  Housing: Unknown (07/05/2024)   Epic    Unable to Pay for Housing in the Last Year: No    Number of Times Moved in the Last  Year: Not on file    Homeless in the Last Year: No  Utilities: Not on file  Health Literacy: Not on file    Current Medications: Current Medications[1]  Physical Exam: BP 124/69 (BP Location: Right Arm)   Pulse 69   Temp 98.1 F (36.7 C) (Oral)   Resp 16   Ht 5' 3.5 (1.613 m)   Wt 191 lb 3.2 oz (86.7 kg)   SpO2 95%   BMI 33.34 kg/m  Constitutional: No acute distress. Neuro/Psych: Alert, oriented.  Head and Neck: Normocephalic, atraumatic. Neck symmetric without masses. Sclera anicteric.  Respiratory: Normal work of breathing. Clear to auscultation bilaterally. Cardiovascular: Regular rate and rhythm, no murmurs, rubs, or gallops. Abdomen: Normoactive bowel sounds. Soft, non-distended, non-tender to palpation. No masses appreciated.  Midline upper abdominal incision with indentation in the midline from her scar. Extremities: Grossly normal range of motion. Warm, well perfused. No edema bilaterally.  Laboratory & Radiologic Studies:    Latest Ref Rng & Units 07/10/2024    2:33 PM 06/12/2024    9:17 AM 04/25/2024    4:09 PM  CBC  WBC 4.0 - 10.5 K/uL 5.5  5.6  6.9   Hemoglobin 12.0 - 15.0 g/dL 85.9  86.4  85.9   Hematocrit 36.0 - 46.0 % 41.6  40.0  42.8   Platelets 150 - 400 K/uL 310  270  336       Latest Ref Rng & Units 07/10/2024    2:33 PM 04/25/2024    4:09 PM 06/17/2021   11:14 AM  BMP  Glucose 70 - 99 mg/dL 886  86  99   BUN 8 - 23 mg/dL 14  20  13    Creatinine 0.44 - 1.00 mg/dL 9.22  8.90  9.25   BUN/Creat Ratio 12 - 28  18  18    Sodium 135 - 145 mmol/L 140  143  143   Potassium 3.5 - 5.1 mmol/L 3.0  3.2  3.5    Chloride 98 - 111 mmol/L 103  101  101   CO2 22 - 32 mmol/L 24  25  25    Calcium 8.9 - 10.3 mg/dL 9.8  9.6  9.5    Assessment & Plan: Hannah Short is a 68 y.o. 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   Plan for definitive and diagnostic surgery today - diagnostic laparoscopy with robotic versus open staging/debulking surgery based on intra-abdominal adhesions (total hysterectomy, BSO, selective lymph node debulking, omentectomy, and any other indicated procedures).  Comer Dollar, MD  Division of Gynecologic Oncology  Department of Obstetrics and Gynecology  [facility]      [1]  Current Facility-Administered Medications:    cefOXitin  (MEFOXIN) 2 g in sodium chloride  0.9 % 100 mL IVPB, 2 g, Intravenous, On Call to OR, Cross, Melissa D, NP   dexamethasone  (DECADRON ) injection 4 mg, 4 mg, Intravenous, On Call to OR, Cross, Eleanor BIRCH, NP   lactated ringers  infusion, , Intravenous, Continuous, Keneth Lynwood POUR, MD, Last Rate: 10 mL/hr at 07/31/24 0653, New Bag at 07/31/24 [phone]

## 2024-07-31 NOTE — Progress Notes (Incomplete)
 Patient given Ice packs and pain medication over night for pain management.   Daughter Hannah Short) would like to be able to hear discussion with MD in the AM if all possible.

## 2024-07-31 NOTE — Transfer of Care (Signed)
 Immediate Anesthesia Transfer of Care Note  Patient: Hannah Short  Procedure(s) Performed: HYSTERECTOMY, TOTAL, ROBOT-ASSISTED, LAPAROSCOPIC, WITH BILATERAL SALPINGO-OOPHORECTOMY (Bilateral) LYMPHADENECTOMY, PELVIS, ROBOT-ASSISTED CYSTOSCOPY (Bladder)  Patient Location: PACU  Anesthesia Type:General  Level of Consciousness: drowsy  Airway & Oxygen Therapy: Patient Spontanous Breathing and Patient connected to face mask oxygen  Post-op Assessment: Report given to RN and Post -op Vital signs reviewed and stable  Post vital signs: Reviewed and stable  Last Vitals:  Vitals Value Taken Time  BP 113/79 07/31/24 11:27  Temp    Pulse 81 07/31/24 11:31  Resp 25 07/31/24 11:31  SpO2 94 % 07/31/24 11:31  Vitals shown include unfiled device data.  Last Pain:  Vitals:   07/31/24 0645  TempSrc:   PainSc: 0-No pain         Complications: No notable events documented.

## 2024-07-31 NOTE — Anesthesia Procedure Notes (Signed)
 Procedure Name: Intubation Date/Time: 07/31/2024 7:36 AM  Performed by: Therisa Doyal CROME, CRNAPre-anesthesia Checklist: Patient identified, Emergency Drugs available, Suction available and Patient being monitored Patient Re-evaluated:Patient Re-evaluated prior to induction Oxygen Delivery Method: Circle system utilized Preoxygenation: Pre-oxygenation with 100% oxygen Induction Type: IV induction Ventilation: Mask ventilation without difficulty Laryngoscope Size: Miller and 2 Grade View: Grade I Tube type: Oral Tube size: 7.5 mm Number of attempts: 1 Airway Equipment and Method: Stylet Placement Confirmation: ETT inserted through vocal cords under direct vision, positive ETCO2 and breath sounds checked- equal and bilateral Secured at: 21 cm Tube secured with: Tape Dental Injury: Teeth and Oropharynx as per pre-operative assessment

## 2024-07-31 NOTE — Care Management CC44 (Signed)
"         Condition Code 44 Documentation Completed  Patient Details  Name: Hannah Short MRN: [id number] Date of Birth: 1956-03-26   Condition Code 44 given:  Yes Patient signature on Condition Code 44 notice:   (Verbally completed by phone.) Documentation of 2 MD's agreement:  Yes Code 44 added to claim:  Yes    [name], RN 07/31/2024, 6:15 PM  "

## 2024-07-31 NOTE — Care Management Obs Status (Signed)
 MEDICARE OBSERVATION STATUS NOTIFICATION   Patient Details  Name: Hannah Short MRN: [id number] Date of Birth: 11-02-1955   Medicare Observation Status Notification Given:  Yes    [name], RN 07/31/2024, 6:15 PM

## 2024-07-31 NOTE — Brief Op Note (Signed)
 07/31/2024  11:55 AM  PATIENT:  Hannah Short  68 y.o. female  PRE-OPERATIVE DIAGNOSIS:  Gynecologic malignancy  POST-OPERATIVE DIAGNOSIS:  Gynecologic malignancy  PROCEDURE:  Procedures: HYSTERECTOMY, TOTAL, ROBOT-ASSISTED, LAPAROSCOPIC, WITH BILATERAL SALPINGO-OOPHORECTOMY (Bilateral) LYMPHADENECTOMY, PELVIS, ROBOT-ASSISTED (N/A) CYSTOSCOPY (N/A)  SURGEON:  Surgeons and Role:    DEWAINE Viktoria Comer JONELLE, MD - Primary   ASSISTANTS: [name], NP   ANESTHESIA:   general  EBL:  75 mL   BLOOD ADMINISTERED:none  DRAINS: none   LOCAL MEDICATIONS USED:  MARCAINE      SPECIMEN:  washings, uterus, cervix, tubes and ovaries, right and left pelvic lymph nodes  DISPOSITION OF SPECIMEN:  PATHOLOGY  COUNTS:  YES  TOURNIQUET:  * No tourniquets in log *  DICTATION: .Note written in EPIC  PLAN OF CARE: Admit for overnight observation  PATIENT DISPOSITION:  PACU - hemodynamically stable.   Delay start of Pharmacological VTE agent (>24hrs) due to surgical blood loss or risk of bleeding: not applicable

## 2024-07-31 NOTE — Anesthesia Preprocedure Evaluation (Signed)
"                                    Anesthesia Evaluation  Patient identified by MRN, date of birth, ID band Patient awake    Reviewed: Allergy & Precautions, NPO status , Patient's Chart, lab work & pertinent test results, reviewed documented beta blocker date and time   History of Anesthesia Complications Negative for: history of anesthetic complications  Airway Mallampati: III  TM Distance: >3 FB     Dental no notable dental hx.    Pulmonary shortness of breath and with exertion, asthma , neg sleep apnea, pneumonia, neg PE   breath sounds clear to auscultation       Cardiovascular hypertension, (-) angina (-) CAD and (-) Past MI + Valvular Problems/Murmurs  Rhythm:Regular Rate:Normal  IMPRESSIONS     1. Left ventricular ejection fraction, by estimation, is 55 to 60%. The  left ventricle has normal function. The left ventricle has no regional  wall motion abnormalities. Left ventricular diastolic parameters are  consistent with Grade I diastolic  dysfunction (impaired relaxation). The average left ventricular global  longitudinal strain is -17.2 %. The global longitudinal strain is normal.   2. Right ventricular systolic function is normal. The right ventricular  size is normal. There is normal pulmonary artery systolic pressure.   3. The mitral valve is normal in structure. Mild mitral valve  regurgitation. No evidence of mitral stenosis.   4. The aortic valve is tricuspid. Aortic valve regurgitation is not  visualized. No aortic stenosis is present.   5. The inferior vena cava is normal in size with greater than 50%  respiratory variability, suggesting right atrial pressure of 3 mmHg.     Neuro/Psych  Headaches, neg Seizures PSYCHIATRIC DISORDERS Anxiety Depression     Neuromuscular disease    GI/Hepatic hiatal hernia,GERD  Medicated and Controlled,,(+) neg Cirrhosis        Endo/Other    Renal/GU Renal disease     Musculoskeletal  (+) Arthritis  ,  Fibromyalgia -  Abdominal   Peds  Hematology  (+) Blood dyscrasia, anemia   Anesthesia Other Findings   Reproductive/Obstetrics                              Anesthesia Physical Anesthesia Plan  ASA: 2  Anesthesia Plan: General   Post-op Pain Management:    Induction: Intravenous  PONV Risk Score and Plan: 2 and Ondansetron , Dexamethasone  and Propofol  infusion  Airway Management Planned: Oral ETT  Additional Equipment:   Intra-op Plan:   Post-operative Plan: Extubation in OR  Informed Consent: I have reviewed the patients History and Physical, chart, labs and discussed the procedure including the risks, benefits and alternatives for the proposed anesthesia with the patient or authorized representative who has indicated his/her understanding and acceptance.     Dental advisory given  Plan Discussed with: CRNA  Anesthesia Plan Comments:          Anesthesia Quick Evaluation  "

## 2024-08-01 ENCOUNTER — Other Ambulatory Visit (HOSPITAL_COMMUNITY): Payer: Self-pay

## 2024-08-01 ENCOUNTER — Other Ambulatory Visit: Payer: Self-pay | Admitting: Gynecologic Oncology

## 2024-08-01 ENCOUNTER — Encounter (HOSPITAL_COMMUNITY): Payer: Self-pay | Admitting: Gynecologic Oncology

## 2024-08-01 DIAGNOSIS — C775 Secondary and unspecified malignant neoplasm of intrapelvic lymph nodes: Secondary | ICD-10-CM | POA: Diagnosis not present

## 2024-08-01 DIAGNOSIS — R7989 Other specified abnormal findings of blood chemistry: Secondary | ICD-10-CM

## 2024-08-01 LAB — BASIC METABOLIC PANEL WITH GFR
Anion gap: 10 (ref 5–15)
BUN: 9 mg/dL (ref 8–23)
CO2: 27 mmol/L (ref 22–32)
Calcium: 8.7 mg/dL — ABNORMAL LOW (ref 8.9–10.3)
Chloride: 101 mmol/L (ref 98–111)
Creatinine, Ser: 1.01 mg/dL — ABNORMAL HIGH (ref 0.44–1.00)
GFR, Estimated: >60 mL/min
Glucose, Bld: 116 mg/dL — ABNORMAL HIGH (ref 70–99)
Potassium: 4.3 mmol/L (ref 3.5–5.1)
Sodium: 138 mmol/L (ref 135–145)

## 2024-08-01 LAB — CBC
HCT: 37.4 % (ref 36.0–46.0)
Hemoglobin: 12.5 g/dL (ref 12.0–15.0)
MCH: 31.3 pg (ref 26.0–34.0)
MCHC: 33.4 g/dL (ref 30.0–36.0)
MCV: 93.7 fL (ref 80.0–100.0)
Platelets: 318 K/uL (ref 150–400)
RBC: 3.99 MIL/uL (ref 3.87–5.11)
RDW: 14.1 % (ref 11.5–15.5)
WBC: 17.8 K/uL — ABNORMAL HIGH (ref 4.0–10.5)
nRBC: 0 % (ref 0.0–0.2)

## 2024-08-01 LAB — CREATININE, SERUM
Creatinine, Ser: 1.16 mg/dL — ABNORMAL HIGH (ref 0.44–1.00)
GFR, Estimated: 51 mL/min — ABNORMAL LOW

## 2024-08-01 MED ORDER — APIXABAN 2.5 MG PO TABS
2.5000 mg | ORAL_TABLET | Freq: Two times a day (BID) | ORAL | 0 refills | Status: DC
  Filled 2024-08-01: qty 28, 14d supply, fill #0

## 2024-08-01 NOTE — Progress Notes (Signed)
 Discharge med in  a secure bag delivered to patient by this RN

## 2024-08-01 NOTE — Progress Notes (Signed)
 Reviewed written d/c instructions w pt, all questions answered and she verbalized understanding. D/C via w/c w all belongings in stable condition.

## 2024-08-01 NOTE — Progress Notes (Signed)
 1 Day Post-Op Procedures (LRB): HYSTERECTOMY, TOTAL, ROBOT-ASSISTED, LAPAROSCOPIC, WITH BILATERAL SALPINGO-OOPHORECTOMY (Bilateral) LYMPHADENECTOMY, PELVIS, ROBOT-ASSISTED (N/A) CYSTOSCOPY (N/A)  Subjective: Patient reports minimal to no pain. No nausea or emesis. Had some chicken nuggets from [employer] last pm. Has been ambulating to the bathroom. Voiding without difficulty. No flatus or BM reported. Denies chest pain, dyspnea. Instructed on IS use.    Objective: Vital signs in last 24 hours: Temp:  [97.5 F (36.4 C)-99.1 F (37.3 C)] 97.8 F (36.6 C) (12/31 0534) Pulse Rate:  [56-87] 56 (12/31 0534) Resp:  [14-24] 16 (12/31 0534) BP: (106-138)/(57-89) 123/71 (12/31 0534) SpO2:  [93 %-98 %] 94 % (12/31 0534) Last BM Date : 07/31/24  Intake/Output from previous day: 12/30 0701 - 12/31 0700 In: 2030 [P.O.:370; I.V.:1660] Out: 975 [Urine:900; Blood:75]  Physical Examination: General: alert, cooperative, and no distress Resp: clear to auscultation bilaterally Cardio: regular rate and rhythm, S1, S2 normal, no murmur, click, rub or gallop GI: incision: lap site incisions to the abdomen with dermabond intact without erythema or drainage and abdomen rounded, tympanic, active bowel sounds Extremities: extremities normal, atraumatic, no cyanosis or edema  Labs: CBC    Component Value Date/Time   WBC 17.8 (H) 08/01/2024 0525   RBC 3.99 08/01/2024 0525   HGB 12.5 08/01/2024 0525   HGB 14.0 04/25/2024 1609   HCT 37.4 08/01/2024 0525   HCT 42.8 04/25/2024 1609   PLT 318 08/01/2024 0525   PLT 336 04/25/2024 1609   MCV 93.7 08/01/2024 0525   MCV 93 04/25/2024 1609   MCH 31.3 08/01/2024 0525   MCHC 33.4 08/01/2024 0525   RDW 14.1 08/01/2024 0525   RDW 14.4 04/25/2024 1609   LYMPHSABS 2.7 04/25/2024 1609   MONOABS 0.7 08/12/2020 1437   EOSABS 0.2 04/25/2024 1609   BASOSABS 0.1 04/25/2024 1609   BMET    Component Value Date/Time   NA 138 08/01/2024 0525   NA 143 04/25/2024  1609   K 4.3 08/01/2024 0525   CL 101 08/01/2024 0525   CO2 27 08/01/2024 0525   GLUCOSE 116 (H) 08/01/2024 0525   BUN 9 08/01/2024 0525   BUN 20 04/25/2024 1609   CREATININE 1.01 (H) 08/01/2024 0525   CALCIUM 8.7 (L) 08/01/2024 0525   EGFR 56 (L) 04/25/2024 1609   GFRNONAA >60 08/01/2024 0525    Assessment: 68 y.o. s/p Procedures: HYSTERECTOMY, TOTAL, ROBOT-ASSISTED, LAPAROSCOPIC, WITH BILATERAL SALPINGO-OOPHORECTOMY LYMPHADENECTOMY, PELVIS, ROBOT-ASSISTED CYSTOSCOPY: stable Pain:  Pain is well-controlled on PRN medications.  Heme: Hgb 12.5 and Hct 37.4. Appropriate given preop values and surgical losses.   ID: WBC 17.8. Given decadron  intra-op along with intra-op antibiotic.  CV: BP and HR overall stable. Continue to monitor with ordered vital signs.  GI:  Tolerating po: yes. Antiemetics ordered if needed.  GU: Voiding without difficulty per pt. Has some mild urethra burning.  Creatinine slightly elevated at 1.01.  FEN: No critical values.  Prophylaxis: SCDs and lovenox .  Plan: IV to saline lock Encourage PO intake Encouraged increasing mobility Given slight elevation in creatinine, plan for repeat later today If meeting milestones, plan for discharge later today.    LOS: 1 day    Meghen Akopyan D Leyana Whidden 08/01/2024, 6:46 AM

## 2024-08-01 NOTE — Discharge Instructions (Addendum)
 AFTER SURGERY INSTRUCTIONS   Plan to have lab work at [facility] on Friday afternoon to follow up on kidney function. Please try to stay hydrated to help this improve.  You will need to be on a blood thinner after surgery to prevent blood clots for 2 weeks based on the surgery being through small incisions. This can be given in pill form called Eliquis.   PLAN ON STARTING ELIQUIS (BLOOD THINNER) August 02, 2024 in the am since you received the blood thinner injection lovenox  before leaving the hospital on 08/01/24.  YOU WILL TAKE ELIQUIS TWICE DAILY. THIS IS A BLOOD THINNER TO PREVENT BLOOD CLOTS SO YOU WILL NEED TO MONITOR FOR BLEEDING AND CALL THE OFFICE.  AVOID USE OF NSAIDS (IBUPROFEN, NAPROXEN) WHILE TAKING THE BLOOD THINNER.   You can stop taking the Cipro  antibiotic since you received 3 days worth and had IV antibiotics given during surgery.  Return to work: 4-6 weeks if applicable   Activity: 1. Be up and out of the bed during the day.  Take a nap if needed.  You may walk up steps but be careful and use the hand rail.  Stair climbing will tire you more than you think, you may need to stop part way and rest.    2. No lifting or straining for 6 weeks over 10 pounds. No pushing, pulling, straining for 6 weeks.   3. No driving for 4-89 days when the following criteria have been met: Do not drive if you are taking narcotic pain medicine and make sure that your reaction time has returned.    4. You can shower as soon as the next day after surgery. Shower daily.  Use your regular soap and water (not directly on the incision) and pat your incision(s) dry afterwards; don't rub.  No tub baths or submerging your body in water until cleared by your surgeon. If you have the soap that was given to you by pre-surgical testing that was used before surgery, you do not need to use it afterwards because this can irritate your incisions.    5. No sexual activity and nothing in the vagina for 12 weeks.    6. You may experience a small amount of clear drainage from your incisions, which is normal.  If the drainage persists, increases, or changes color please call the office.   7. Do not use creams, lotions, or ointments such as neosporin on your incisions after surgery until advised by your surgeon because they can cause removal of the dermabond glue on your incisions.     8. You may experience vaginal spotting after surgery or when the stitches at the top of the vagina begin to dissolve.  The spotting is normal but if you experience heavy bleeding, call our office.   9. Take Tylenol  first for pain if you are able to take these medication and only use narcotic pain medication for severe pain not relieved by the Tylenol .  Monitor your Tylenol  intake to a max of 4,000 mg in a 24 hour period.   Diet: 1. Low sodium Heart Healthy Diet is recommended but you are cleared to resume your normal (before surgery) diet after your procedure.   2. It is safe to use a laxative, such as Miralax or Colace, if you have difficulty moving your bowels before surgery. You have been prescribed Sennakot-S to take at bedtime every evening after surgery to keep bowel movements regular and to prevent constipation.  You can also use  trulance . If you decide to continue this, you can wait to see if the sennakot-S is needed.   Wound Care: 1. Keep clean and dry.  Shower daily.   Reasons to call the Doctor: Fever - Oral temperature greater than 100.4 degrees Fahrenheit Foul-smelling vaginal discharge Difficulty urinating Nausea and vomiting Increased pain at the site of the incision that is unrelieved with pain medicine. Difficulty breathing with or without chest pain New calf pain especially if only on one side Sudden, continuing increased vaginal bleeding with or without clots.   Contacts: For questions or concerns you should contact:   Dr. Hoy Masters at [phone]   [name], NP at [phone]    After Hours: call [phone] and have the GYN Oncologist paged/contacted (after 5 pm or on the weekends). You will speak with an after hours RN and let he or she know you have had surgery.

## 2024-08-01 NOTE — Plan of Care (Signed)
" °  Problem: Education: Goal: Knowledge of the prescribed therapeutic regimen will improve Outcome: Adequate for Discharge   Problem: Bowel/Gastric: Goal: Gastrointestinal status for postoperative course will improve Outcome: Adequate for Discharge   Problem: Cardiac: Goal: Ability to maintain an adequate cardiac output Outcome: Adequate for Discharge Goal: Will show no evidence of cardiac arrhythmias Outcome: Adequate for Discharge   Problem: Nutritional: Goal: Will attain and maintain optimal nutritional status Outcome: Adequate for Discharge   Problem: Neurological: Goal: Will regain or maintain usual level of consciousness Outcome: Adequate for Discharge   Problem: Clinical Measurements: Goal: Ability to maintain clinical measurements within normal limits Outcome: Adequate for Discharge Goal: Postoperative complications will be avoided or minimized Outcome: Adequate for Discharge   Problem: Respiratory: Goal: Will regain and/or maintain adequate ventilation Outcome: Adequate for Discharge Goal: Respiratory status will improve Outcome: Adequate for Discharge   Problem: Skin Integrity: Goal: Demonstrates signs of wound healing without infection Outcome: Adequate for Discharge   Problem: Urinary Elimination: Goal: Will remain free from infection Outcome: Adequate for Discharge Goal: Ability to achieve and maintain adequate urine output Outcome: Adequate for Discharge   Problem: Education: Goal: Knowledge of General Education information will improve Description: Including pain rating scale, medication(s)/side effects and non-pharmacologic comfort measures Outcome: Adequate for Discharge   Problem: Health Behavior/Discharge Planning: Goal: Ability to manage health-related needs will improve Outcome: Adequate for Discharge   Problem: Clinical Measurements: Goal: Ability to maintain clinical measurements within normal limits will improve Outcome: Adequate for  Discharge Goal: Will remain free from infection Outcome: Adequate for Discharge Goal: Diagnostic test results will improve Outcome: Adequate for Discharge Goal: Respiratory complications will improve Outcome: Adequate for Discharge Goal: Cardiovascular complication will be avoided Outcome: Adequate for Discharge   Problem: Activity: Goal: Risk for activity intolerance will decrease Outcome: Adequate for Discharge   Problem: Nutrition: Goal: Adequate nutrition will be maintained Outcome: Adequate for Discharge   Problem: Coping: Goal: Level of anxiety will decrease Outcome: Adequate for Discharge   Problem: Elimination: Goal: Will not experience complications related to bowel motility Outcome: Adequate for Discharge Goal: Will not experience complications related to urinary retention Outcome: Adequate for Discharge   Problem: Pain Managment: Goal: General experience of comfort will improve and/or be controlled Outcome: Adequate for Discharge   Problem: Safety: Goal: Ability to remain free from injury will improve Outcome: Adequate for Discharge   Problem: Skin Integrity: Goal: Risk for impaired skin integrity will decrease Outcome: Adequate for Discharge   Problem: Education: Goal: Knowledge of the prescribed therapeutic regimen will improve Outcome: Adequate for Discharge Goal: Understanding of sexual limitations or changes related to disease process or condition will improve Outcome: Adequate for Discharge Goal: Individualized Educational Video(s) Outcome: Adequate for Discharge   Problem: Self-Concept: Goal: Communication of feelings regarding changes in body function or appearance will improve Outcome: Adequate for Discharge   Problem: Skin Integrity: Goal: Demonstration of wound healing without infection will improve Outcome: Adequate for Discharge   "

## 2024-08-01 NOTE — Discharge Summary (Signed)
 " Physician Discharge Summary  Patient ID: NEOMI LAIDLER MRN: [id number] DOB/AGE: [birth year] 68 y.o.  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    Hospital Course: On 07/31/2024, the patient underwent the following: Procedures: HYSTERECTOMY, TOTAL, ROBOT-ASSISTED, LAPAROSCOPIC, WITH BILATERAL SALPINGO-OOPHORECTOMY. LYMPHADENECTOMY, PELVIS, ROBOT-ASSISTED, CYSTOSCOPY. The postoperative course was uneventful.  She was discharged to home on postoperative day 1 tolerating a regular diet, voiding, pain controlled, ambulating, passing flatus. Will plan for repeat labs this Friday to follow up on slightly elevated creatinine.   Consults: None  Significant Diagnostic Studies: Labs  Treatments: surgery: see above  Discharge Exam (from am exam): Blood pressure (!) 125/56, pulse 66, temperature 98 F (36.7 C), temperature source Oral, resp. rate 18, height 5' 3.5 (1.613 m), weight 191 lb 3.2 oz (86.7 kg), SpO2 96%. General: alert, cooperative, and no distress Resp: clear to auscultation bilaterally Cardio: regular rate and rhythm, S1, S2 normal, no murmur, click, rub or gallop GI: incision: lap site incisions to the abdomen with dermabond intact without erythema or drainage and abdomen rounded, tympanic, active bowel sounds Extremities: extremities normal, atraumatic, no cyanosis or edema  Disposition: Discharge disposition: 01-Home or Self Care       Discharge Instructions     Call MD for:  difficulty breathing, headache or visual disturbances   Complete by: As directed    Call MD for:  extreme fatigue   Complete by: As directed    Call MD for:  hives   Complete by: As directed    Call MD for:  persistant dizziness or light-headedness   Complete by: As directed     Call MD for:  persistant nausea and vomiting   Complete by: As directed    Call MD for:  redness, tenderness, or signs of infection (pain, swelling, redness, odor or green/yellow discharge around incision site)   Complete by: As directed    Call MD for:  severe uncontrolled pain   Complete by: As directed    Call MD for:  temperature >100.4   Complete by: As directed    Diet - low sodium heart healthy   Complete by: As directed    Discharge wound care:   Complete by: As directed    Keep incisions clean and dry when not showering   Driving Restrictions   Complete by: As directed    No driving for 4-89 days.  Do not take narcotics and drive. You need to make sure your reaction time has returned.   Increase activity slowly   Complete by: As directed    Lifting restrictions   Complete by: As directed    No lifting greater than 10 lbs, pushing, pulling, straining for 6 weeks.   Sexual Activity Restrictions   Complete by: As directed    No sexual activity, nothing in the vagina, for 12 weeks.      Allergies as of 08/01/2024       Reactions   Flagyl [metronidazole] Rash   Patient states she breaks out with a red, itchy rash all over and feels like she is on fire   Topamax [topiramate] Other (See Comments)   Pt.states makes legs go numb   Betanidine [bethanidine]    Clonazepam    BALANCE ISSUE   Tramadol Hcl    Causes nightmares   Betadine [povidone  Iodine] Rash   Sulfa Antibiotics Rash        Medication List     STOP taking these medications    ciprofloxacin  500 MG tablet Commonly known as: Cipro        TAKE these medications    amLODipine  2.5 MG tablet Commonly known as: NORVASC  TAKE ONE TABLET BY MOUTH ONCE DAILY   arformoterol  15 MCG/2ML Nebu Commonly known as: BROVANA  Take 2 mLs (15 mcg total) by nebulization 2 (two) times daily.   budesonide  0.5 MG/2ML nebulizer solution Commonly known as: PULMICORT  Use one vial 3 times daily as needed during  flares. What changed: See the new instructions.   Eliquis 2.5 MG Tabs tablet Generic drug: apixaban Take 1 tablet (2.5 mg total) by mouth 2 (two) times daily. Start taking on: August 02, 2024   famotidine 40 MG tablet Commonly known as: PEPCID Take 40 mg by mouth in the morning.   fexofenadine 180 MG tablet Commonly known as: ALLEGRA Take 180 mg by mouth daily as needed for allergies.   fluticasone 50 MCG/ACT nasal spray Commonly known as: FLONASE Place 2 sprays into both nostrils daily.   loratadine 10 MG tablet Commonly known as: CLARITIN Take 10 mg by mouth daily as needed for allergies.   NON FORMULARY IB Gaurd   omeprazole 40 MG capsule Commonly known as: PRILOSEC Take 40 mg by mouth daily before breakfast.   oxyCODONE  5 MG immediate release tablet Commonly known as: Oxy IR/ROXICODONE  Take 1 tablet (5 mg total) by mouth every 4 (four) hours as needed for severe pain (pain score 7-10). For AFTER surgery only, do not take and drive   potassium chloride SA 20 MEQ tablet Commonly known as: KLOR-CON M Take 20 mEq by mouth daily.   senna-docusate 8.6-50 MG tablet Commonly known as: Senokot-S Take 2 tablets by mouth at bedtime. For AFTER surgery, do not take if having diarrhea   Simethicone  125 MG Caps Take 125-250 mg by mouth as needed (stomach cramps).   Trulance  3 MG Tabs Generic drug: Plecanatide  Take 3 mg by mouth in the morning.   valACYclovir 500 MG tablet Commonly known as: VALTREX Take 500 mg by mouth daily as needed (fever blisters (outbreaks)).   Ventolin  HFA 108 (90 Base) MCG/ACT inhaler Generic drug: albuterol  Inhale 1-2 puffs into the lungs every 6 (six) hours as needed for shortness of breath or wheezing.   albuterol  (2.5 MG/3ML) 0.083% nebulizer solution Commonly known as: PROVENTIL  Take 3 mLs (2.5 mg total) by nebulization every 4 (four) hours as needed for wheezing or shortness of breath.               Discharge Care Instructions   (From admission, onward)           Start     Ordered   08/01/24 0000  Discharge wound care:       Comments: Keep incisions clean and dry when not showering   08/01/24 1521            Follow-up Information     Eldonna Mays, MD Follow up on 08/13/2024.   Specialty: Gynecologic Oncology Why: at 3:45pm at the [facility] information: [address] [place] [address] [phone]         [facility] Follow up on 08/03/2024.   Why: Plan to have lab work at [facility] on Friday, August 03, 2024                Greater  than thirty minutes were spend for face to face discharge instructions and discharge orders/summary in EPIC.   Signed: Marcene Laskowski D Makeyla Govan 08/01/2024, 4:12 PM     "

## 2024-08-03 ENCOUNTER — Ambulatory Visit: Payer: Self-pay | Admitting: *Deleted

## 2024-08-03 ENCOUNTER — Telehealth: Payer: Self-pay | Admitting: *Deleted

## 2024-08-03 ENCOUNTER — Other Ambulatory Visit (HOSPITAL_COMMUNITY)
Admission: RE | Admit: 2024-08-03 | Discharge: 2024-08-03 | Disposition: A | Discharge status: Home / Self Care | Source: Ambulatory Visit | Attending: Gynecologic Oncology | Admitting: Gynecologic Oncology

## 2024-08-03 DIAGNOSIS — R7989 Other specified abnormal findings of blood chemistry: Secondary | ICD-10-CM | POA: Diagnosis present

## 2024-08-03 LAB — BASIC METABOLIC PANEL WITH GFR
Anion gap: 14 (ref 5–15)
BUN: 9 mg/dL (ref 8–23)
CO2: 24 mmol/L (ref 22–32)
Calcium: 8.8 mg/dL — ABNORMAL LOW (ref 8.9–10.3)
Chloride: 101 mmol/L (ref 98–111)
Creatinine, Ser: 0.72 mg/dL (ref 0.44–1.00)
GFR, Estimated: >60 mL/min
Glucose, Bld: 86 mg/dL (ref 70–99)
Potassium: 3.1 mmol/L — ABNORMAL LOW (ref 3.5–5.1)
Sodium: 140 mmol/L (ref 135–145)

## 2024-08-03 LAB — CBC
HCT: 39.4 % (ref 36.0–46.0)
Hemoglobin: 13.2 g/dL (ref 12.0–15.0)
MCH: 31 pg (ref 26.0–34.0)
MCHC: 33.5 g/dL (ref 30.0–36.0)
MCV: 92.5 fL (ref 80.0–100.0)
Platelets: 273 K/uL (ref 150–400)
RBC: 4.26 MIL/uL (ref 3.87–5.11)
RDW: 14.1 % (ref 11.5–15.5)
WBC: 5.7 K/uL (ref 4.0–10.5)
nRBC: 0 % (ref 0.0–0.2)

## 2024-08-03 NOTE — Telephone Encounter (Signed)
 Spoke with Hannah Short this morning. She states she is eating, drinking and urinating well. She has not had a BM yet but is passing gas. She is taking senokot as prescribed and encouraged her to drink plenty of water. She denies fever or chills. Incisions are dry and intact. She rates her pain 1/10. Her pain is controlled with oxycodone  once  only in the evening.     Instructed to call office with any fever, chills, purulent drainage, uncontrolled pain or any other questions or concerns. Patient verbalizes understanding.   Pt aware of post op appointments as well as the office number [phone] and after hours number [phone] to call if she has any questions or concerns

## 2024-08-03 NOTE — Telephone Encounter (Signed)
 Spoke with patient and relayed message from [name], NP that patient's kidney function has improved with creatinine now at 0.72. She did a good job hydrating. CBC looking at blood counts looks good. No abnormalities there.   Patient's potassium is slightly  low at 3.1. Pt advised to increase her potassium dose to 20 meq twice daily for 2 days then back to regular dose of once daily. There is a note that med is on hold due to nausea. Pt states she is able to take it now. States she lets it sit in her mouth for a few minutes, then she is able to swallow it with food.    Pt thanked the office for calling and is glad that her kidney function has improved back to normal levels. Pt is also stating she still hasn't had a bowel movement since surgery. Advised patient to take senokot-s 2 in the am and 2 in the pm with at least 8 oz. Of water and add miralax 1 capful twice daily until BM. Also advised patient once she has a bowel movement she can stop the miralax and add in as needed depending on the consistency of her stool. Pt verbalized understanding.

## 2024-08-03 NOTE — Telephone Encounter (Signed)
-----   Message from [name], NP sent at 08/03/2024  4:00 PM EST ----- Please call patient with lab work. Please let her know her kidney function has improved with creatinine now at 0.72. She did a good job hydrating. CBC looking at blood counts looks good. No  abnormalities there. Her potassium is slightly low at 3.1.   It looks like she takes potassium. Please have her increase the dose to 20 meq twice daily for 2 days then back to her regular dose of once daily. There is a note the med is on hold due to nausea. Do  we need to look at a different way to take like liquid or does she wants to try dissolving in water (not crushing) and taking with applesauce?

## 2024-08-06 ENCOUNTER — Telehealth: Payer: Self-pay | Admitting: Oncology

## 2024-08-06 ENCOUNTER — Other Ambulatory Visit: Payer: Self-pay | Admitting: Gynecologic Oncology

## 2024-08-06 ENCOUNTER — Telehealth: Payer: Self-pay

## 2024-08-06 DIAGNOSIS — C579 Malignant neoplasm of female genital organ, unspecified: Secondary | ICD-10-CM

## 2024-08-06 NOTE — Telephone Encounter (Signed)
 Hannah Short is aware the office received a refill request from her pharmacy. Per [name] NP, 1 refill given. She is aware to use only with pain 7-10 on pain scale. She said she only uses it at night.

## 2024-08-06 NOTE — Telephone Encounter (Signed)
 Hannah Short called and requested a refill of oxycodone .  She has been taking 1 tablet at night since surgery to help her sleep and is out.  She loaded and unloaded the dishwasher today and feels like she overdid it.  Tylenol  is not helping.  Reinforced instructions for no lifting straining or pulling for 6 weeks after surgery and she agreed.  Advised that [name], NP will send a refill to her pharmacy.

## 2024-08-07 LAB — CYTOLOGY - NON PAP

## 2024-08-09 ENCOUNTER — Telehealth: Payer: Self-pay | Admitting: *Deleted

## 2024-08-09 NOTE — Telephone Encounter (Signed)
 Spoke with Hannah Short who called to state she has been feeling bloated and distended Pt reports taking senokot-s twice daily and using miralax periodically. Has been having daily mushy stools since her first BM after surgery  on 1/3. But, the last few days has felt, pressure, distention and bloating in her abdomen. She felt nauseous this morning without vomiting and took a dose of trulance  and had 4 watery stools with flakes and feels some relief. Pt is also passing lots of gas.   Pt also reports decreased appetite but is keeping herself well hydrated. Denies fever and or chills and reports no vaginal bleeding and no urinary symptoms. Pt states her incisions are healing well, but she is experiencing some stabbing pains to the incision closest to the umbilicus and left upper side.   Advised patient that she needs to stick with a bowel regime of her choice. If she chooses to take the trulance  daily, she can stop the senokot-s and the miralax and monitor her bowel symptoms. If no improvement call the office back.   Also advised patient to bind her abdominal incisions wearing high waist supportive pants and top, using a pillow to support and try alternating heat and ice to areas of discomfort. Pt is not taking oxycodone  and prefers alternating ibuprofen and tylenol . Pt is aware to continue following all post op activity restrictions. Advised her message will be relayed to providers and the office will call back with any new recommendations.

## 2024-08-13 ENCOUNTER — Other Ambulatory Visit: Payer: Self-pay | Admitting: Oncology

## 2024-08-13 ENCOUNTER — Telehealth: Payer: Self-pay | Admitting: Oncology

## 2024-08-13 ENCOUNTER — Encounter: Payer: Self-pay | Admitting: Psychiatry

## 2024-08-13 ENCOUNTER — Inpatient Hospital Stay: Attending: Gynecologic Oncology | Admitting: Psychiatry

## 2024-08-13 VITALS — BP 133/55 | HR 76 | Temp 98.0°F | Resp 19 | Wt 193.6 lb

## 2024-08-13 DIAGNOSIS — K5900 Constipation, unspecified: Secondary | ICD-10-CM | POA: Diagnosis not present

## 2024-08-13 DIAGNOSIS — Z90722 Acquired absence of ovaries, bilateral: Secondary | ICD-10-CM | POA: Diagnosis not present

## 2024-08-13 DIAGNOSIS — Z7189 Other specified counseling: Secondary | ICD-10-CM

## 2024-08-13 DIAGNOSIS — C541 Malignant neoplasm of endometrium: Secondary | ICD-10-CM | POA: Diagnosis present

## 2024-08-13 DIAGNOSIS — Z9079 Acquired absence of other genital organ(s): Secondary | ICD-10-CM | POA: Diagnosis not present

## 2024-08-13 DIAGNOSIS — Z90710 Acquired absence of both cervix and uterus: Secondary | ICD-10-CM | POA: Diagnosis not present

## 2024-08-13 DIAGNOSIS — C775 Secondary and unspecified malignant neoplasm of intrapelvic lymph nodes: Secondary | ICD-10-CM | POA: Insufficient documentation

## 2024-08-13 DIAGNOSIS — C569 Malignant neoplasm of unspecified ovary: Secondary | ICD-10-CM

## 2024-08-13 DIAGNOSIS — L03311 Cellulitis of abdominal wall: Secondary | ICD-10-CM

## 2024-08-13 MED ORDER — CEPHALEXIN 500 MG PO CAPS: 500.0000 mg | ORAL_CAPSULE | Freq: Four times a day (QID) | ORAL | 0 refills | Status: AC

## 2024-08-13 NOTE — Progress Notes (Signed)
 Gynecologic Oncology Return Clinic Visit  Date of Service: 08/13/2024 Referring Provider: Gordy Starch, MD [facility])   Assessment & Plan: Hannah Short is a 69 y.o. 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  Postop: - Pt recovering well from surgery and healing appropriately postoperatively. - Ongoing postoperative expectations and precautions reviewed. Continue with no lifting >10lbs through 6 weeks postoperatively - Midline incision with erythema surrounding inferior aspect without fluctuance or drainage.  May be consistent with cellulitis.  Prescription for Keflex  sent to pharmacy.  Precautions reviewed.  Gynecologic malignancy: - Reviewed tumor board discussion. - Tubes and ovaries pathologically normal.  Intraoperatively no concern for primary peritoneal disease.  Washings negative. - Remote history of endometrial ablation.  On pathology, fibrotic and dystrophic appearance of endometrium but no clear evidence of carcinoma.  Pathology will reevaluate the endometrium to ensure that all was submitted for evaluation. - Reviewed with patient that at this time it is not clear the origin of her disease.  There is some suspicion for an endometrial origin given difficult evaluation based on her previous endometrial ablation.  However, prior stains did show WT1 positive which favors a ovary/tubal origin but some endometrial cancers can also be WT1 positive.  Reviewed that we can treat without knowing exactly her origin of the cancer but if needed for future lines of therapies this may slightly change therapy so there are other ways for us  to try to further evaluate origin. - Will request additional testing with NGS, HER2, MMR, and tumor origin testing.  Patient amenable to this. - Additionally discussed PET scan to occur 6 to 8 weeks postop to evaluate for any other sites of  disease that might suggest an origin. - Otherwise reviewed recommendation for adjuvant chemotherapy.  Reviewed typical treatment course and side effects.  If workup suspicious for endometrial origin, would consider addition of immunotherapy or Cepton if patient was MMRd or HER2 positive.  Will follow-up these additional results. - If this is consistent with stage III high-grade serous endometrial cancer, could consider vaginal brachytherapy to go along with chemotherapy.  Will follow-up results and refer to radiation oncology if appropriate. - Referral to medical oncology  Constipation: - Passing gas and able to have bowel movements but more difficult. - Reports more nausea with MiraLAX and helpful so discontinued. - Discussed that she could try a dose of milk of magnesia.  If no response could try Dulcolax suppository. - Continue with Trulance  and add back senna.  RTC following completion of adjuvant therapy.  Hoy Masters, MD Gynecologic Oncology   Medical Decision Making I personally spent  TOTAL 45 minutes face-to-face and non-face-to-face in the care of this patient, which includes all pre, intra, and post visit time on the date of service. The discussion of treatment of gynecologic malignancy is beyond the scope of routine postoperative care.   ----------------------- Reason for Visit: Postop/Treatment discussion  Treatment History: Oncology History  Gynecologic malignancy [facility])  05/24/2024 Imaging   CT a/p: IMPRESSION: 1. New pelvic lymphadenopathy in the right external iliac chain. No abdominal lymphadenopathy identified. Differential diagnosis includes lymphoproliferative disorder metastatic disease, and infectious/inflammatory etiologies. Consider PET CT, 6-month follow up by CT, or tissue sampling. 2. Cholelithiasis without evidence of cholecystitis.   06/12/2024 Initial Biopsy   A. LYMPH NODE, RIGHT EXTERNAL ILIAC/INGUINAL, NEEDLE CORE BIOPSY:      Metastatic  carcinoma, favor high-grade serous carcinoma  of gynecologic system.      See comment.  COMMENT:  The specimen shows lymphoid tissue involved by a high-grade malignancy forming papillary architecture. Immunohistochemical stains were performed to characterize the tumor cells. The cells are positive for CK7, PAX8, and WT-1. The cells are negative for TTF-1, CDX2, and GATA3. P53 is a mutant type of staining / strongly and diffusely positive. 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    06/21/2024 Imaging   CT chest high resolution: IMPRESSION: Mosaic ground-glass attenuation suggestive of small airway disease/air trapping which can be seen the setting of hypersensitivity pneumonitis.   Multiple scattered pulmonary nodules as detailed above, grossly stable to prior likely benign. Recommend continued attention on follow-up exam to ensure stability.   Upper abdominal findings as above.   Atherosclerotic calcifications of aorta and coronary arteries.   07/17/2024 Initial Diagnosis   Gynecologic malignancy (HCC)   07/20/2024 Genetic Testing   [employer] germline testing: VUS: MET   07/31/2024 Surgery   Operation: Robotic-assisted laparoscopic total hysterectomy with bilateral salpingo-oophorectomy, pelvic lymphadenectomy bilaterally, lysis of adhesions for approximately 25 minutes, cystoscopy   Operative Findings: On EUA, small mobile uterus, no adnexal masses appreciated.  On intra-abdominal entry, adhesions of the omentum to the anterior abdominal wall along the midline from the level of the umbilicus to the xiphoid process consistent with prior hernia repairs.  Although entry with a 5 mm trocar was within the area of omental adhesions, no obvious injury noted either on initial inspection or subsequently after robotic lysis of adhesions was performed to take down the omentum adherent along the left  upper abdomen and around the left upper quadrant incision/trocar.  Normal-appearing small and large bowel.  No peritoneal lesions.  Uterus approximately 8 cm and normal in appearance.  Somewhat atrophic bilateral ovaries, minimally atypical versus atrophic fallopian tubes bilaterally.  No ascites.  On inspection of bilateral pelvic sidewalls, enlarged right external iliac lymph nodes as well as a right obturator lymph node.  No significantly enlarged left pelvic lymph nodes.    07/31/2024 Pathology Results   Cytology: FINAL MICROSCOPIC DIAGNOSIS:  - No malignant cells identified    07/31/2024 Pathology Results   A. UTERUS, CERVIX, FALLOPIAN TUBE, OVARY, BILATERAL, HYSTERECTOMY: Endometrium: Fibrotic with dystrophic calcifications consistent with previous procedure. Negative for carcinoma. Cervix: Nabothian cyst, negative for carcinoma. Myometrium: Unremarkable, negative for carcinoma. Bilateral ovaries: Unremarkable, negative for carcinoma. Bilateral fallopian tubes: Unremarkable, negative for carcinoma. See oncology table and comment.  B. LYMPH NODE, RIGHT EXTERNAL ILIAC, LYMPHADENECTOMY: - Metastatic serous carcinoma involving a lymph node (1/1).  C. LYMPH NODE, RIGHT OBTURATOR, LYMPHADENECTOMY: - Metastatic serous carcinoma involving a lymph node (1/1).  D. LYMPH NODE, LEFT PELVIC, LYMPHADENECTOMY: - Microscopic focus of metastatic serous carcinoma involving a lymph node (1/1).  ONCOLOGY TABLE: The carcinoma is staged as an endometrial primary although there is no residual carcinoma in the endometrium.  See comment. UTERUS, CARCINOMA OR CARCINOSARCOMA: Resection Procedure: Total hysterectomy with bilateral salpingo-oophorectomy and three sentinel lymph nodes Histologic Type: High-grade serous carcinoma metastatic to lymph nodes Histologic Grade: FIGO grade 3 Myometrial Invasion:      Depth of Myometrial Invasion (mm): 0      Myometrial Thickness (mm): 10      Percentage of  Myometrial Invasion: 0 mm Uterine Serosa Involvement: Not identified Cervical stromal Involvement: Not identified Extent of involvement of other tissue/organs: Not identified Peritoneal/Ascitic Fluid: Benign, [id number] Lymphovascular Invasion: Not identified Regional Lymph  Nodes:      Pelvic Lymph Nodes Examined:          3 Sentinel          0 non-sentinel          3 total      Pelvic Lymph Nodes with Metastasis: 3          Macrometastasis: (>2.0 mm): 0          Micrometastasis: (>0.2 mm and < 2.0 mm): 0          Isolated Tumor Cells (<0.2 mm): 0          Laterality of Lymph Node with Tumor: Right and left          Extracapsular Extension: Not identified Distant Metastasis:      Distant Site(s) Involved: Not applicable Pathologic Stage Classification (pTNM, AJCC 8th Edition): pT0, pN1a, see comment Ancillary Studies: Immunohistochemical stains, see comment Representative Tumor Block: C1 Comment(s): The right external iliac, right obturator and left pelvic lymph nodes all show metastatic carcinoma.  Immunohistochemistry shows the carcinoma is positive with WT1, PAX8 and p16 and negative with GATA3.  P53 is strongly positive consistent with mutated pattern.  The morphology and immunophenotype are consistent with high-grade serous carcinoma.  The carcinoma is staged as an endometrial primary although the endometrium is fibrotic without carcinoma with suggest a previous procedure.  Dr. Viktoria was notified on 08/08/2023.      Interval History: Pt reports that she is recovering well from surgery. She is using tylenol  as needed for pain. She is eating and drinking but decreased appetite. She is voiding without issue. Having difficulty with BM. Only small amount yesterday. Took sennakot-s. Added miralax but felt nauseated so she stopped. Has resumed her trulance . No bleeding.  Past Medical/Surgical History: Past Medical History:  Diagnosis Date   Acute torn meniscus of knee, left,  initial encounter 2019   ADHD (attention deficit hyperactivity disorder)    Anemia    Anxiety    Arthritis    Asthma    Back pain    Bladder infection 07/27/2024   Bulging lumbar disc    Chest pain    Chronically dry eyes, bilateral    Constipation    Depression    Diverticulosis    Fatty liver    Fibromyalgia    Gallstones    GERD (gastroesophageal reflux disease)    Hashimoto's disease    Heart murmur    mitral valve prolapse   Hepatic steatosis    Hiatal hernia    Hypertension    Hypokalemia    Internal hemorrhoids    Joint pain    Lactose intolerance    Lipoma of colon    ??   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 Albertus   CYSTOSCOPY N/A 07/31/2024   Procedure: CYSTOSCOPY;  Surgeon: Viktoria Comer SAUNDERS, MD;  Location: [facility];  Service: Gynecology;  Laterality: N/A;   HERNIA REPAIR  07/2001   LSC ventral hernia repair with mesh (two prior ventral hernia repairs prior to that)   HYSTEROSCOPY W/ ENDOMETRIAL ABLATION  2005  ROBOTIC ASSISTED TOTAL HYSTERECTOMY WITH BILATERAL SALPINGO OOPHERECTOMY Bilateral 07/31/2024   Procedure: HYSTERECTOMY, TOTAL, ROBOT-ASSISTED, LAPAROSCOPIC, WITH BILATERAL SALPINGO-OOPHORECTOMY;  Surgeon: Viktoria Comer SAUNDERS, MD;  Location: [facility];  Service: Gynecology;  Laterality: Bilateral;   TOE SURGERY  1990   stepped on toothpick    Family History  Problem Relation Age of Onset   Heart failure Mother    Heart failure Father    Colon cancer Father 48   Hypertension Father    Leukemia Paternal Uncle 65   Liver cancer Half-Brother        EtOH   Cancer Half-Brother        maternal, died from cancer (unknown type)   Lung cancer Half-Brother        smoking hx   Breast cancer  Half-Sister        paternal, did not die from breast cancer   Ovarian cancer Half-Sister    Stomach cancer Neg Hx    Esophageal cancer Neg Hx    Endometrial cancer Neg Hx    Prostate cancer Neg Hx     Social History   Socioeconomic History   Marital status: Married    Spouse name: [name]   Number of children: 5   Years of education: 12   Highest education level: Not on file  Occupational History   Occupation: CNA  Tobacco Use   Smoking status: Never    Passive exposure: Past   Smokeless tobacco: Never  Vaping Use   Vaping status: Never Used  Substance and Sexual Activity   Alcohol use: No   Drug use: No   Sexual activity: Not Currently    Partners: Male  Other Topics Concern   Not on file  Social History Narrative   Patient lives at home with her husband Garry) and father in social worker and [place] her daughter.   Patient is taking care of her father full time and disabled daughter.   Education some college.    Right handed.   Caffeine one cup of coffee daily.   Pt states she is increasing her water intake.    Drinks decaf tea.       Social Drivers of Health   Tobacco Use: Low Risk (08/13/2024)   Patient History    Smoking Tobacco Use: Never    Smokeless Tobacco Use: Never    Passive Exposure: Past  Financial Resource Strain: Not on file  Food Insecurity: No Food Insecurity (07/31/2024)   Epic    Worried About Programme Researcher, Broadcasting/film/video in the Last Year: Never true    Ran Out of Food in the Last Year: Never true  Transportation Needs: No Transportation Needs (07/31/2024)   Epic    Lack of Transportation (Medical): No    Lack of Transportation (Non-Medical): No  Physical Activity: Not on file  Stress: Not on file  Social Connections: Patient Declined (07/31/2024)   Social Connection and Isolation Panel    Frequency of Communication with Friends and Family: Patient declined    Frequency of Social Gatherings with Friends and Family: Patient declined    Attends Religious  Services: Patient declined    Database Administrator or Organizations: Patient declined    Attends Banker Meetings: Patient declined    Marital Status: Patient declined  Depression (PHQ2-9): Not on file  Alcohol Screen: Not on file  Housing: Unknown (07/31/2024)   Epic    Unable to Pay for Housing in the Last Year: No    Number of  Times Moved in the Last Year: Not on file    Homeless in the Last Year: No  Utilities: Patient Declined (07/31/2024)   Epic    Threatened with loss of utilities: Patient declined  Health Literacy: Not on file    Current Medications: Current Medications[1]  Review of Symptoms: Complete 10-system review is positive for: Constipation, urinary frequency, rash, back pain, lower abdominal pain, itching  Physical Exam: BP (!) 133/55 (BP Location: Left Arm, Patient Position: Sitting)   Pulse 76   Temp 98 F (36.7 C) (Oral)   Resp 19   Wt 193 lb 9.6 oz (87.8 kg)   SpO2 98%   BMI 33.76 kg/m  General: Alert, oriented, no acute distress. HEENT: Normocephalic, atraumatic. Neck symmetric without masses. Sclera anicteric.  Chest: Normal work of breathing. Clear to auscultation bilaterally.   Cardiovascular: Regular rate and rhythm, no murmurs. Abdomen: Soft, nontender.  Normoactive bowel sounds.  No masses appreciated.  Well-healing incisions.  Small area of erythema around the inferior aspect of her periumbilical incision.  No fluctuance or drainage from the incision.  No erythema around other incisions. Extremities: Grossly normal range of motion.  Warm, well perfused.  No edema bilaterally. Skin: No rashes or lesions noted. GU: Normal appearing external genitalia without erythema, excoriation, or lesions.  Speculum exam reveals intact well-healing cuff.  Bimanual exam reveals cuff and stool in rectal vault. Exam chaperoned by Kimberly Jordan, CMA   Laboratory & Radiologic Studies: Surgical pathology (07/31/24): A. UTERUS, CERVIX, FALLOPIAN  TUBE, OVARY, BILATERAL, HYSTERECTOMY:  Endometrium: Fibrotic with dystrophic calcifications consistent with  previous procedure.  Negative for carcinoma.  Cervix: Nabothian cyst, negative for carcinoma.  Myometrium: Unremarkable, negative for carcinoma.  Bilateral ovaries: Unremarkable, negative for carcinoma.  Bilateral fallopian tubes: Unremarkable, negative for carcinoma.  See oncology table and comment.   B. LYMPH NODE, RIGHT EXTERNAL ILIAC, LYMPHADENECTOMY:  - Metastatic serous carcinoma involving a lymph node (1/1).   C. LYMPH NODE, RIGHT OBTURATOR, LYMPHADENECTOMY:  - Metastatic serous carcinoma involving a lymph node (1/1).   D. LYMPH NODE, LEFT PELVIC, LYMPHADENECTOMY:  - Microscopic focus of metastatic serous carcinoma involving a lymph  node (1/1).      [1]  Current Outpatient Medications:    cephALEXin  (KEFLEX ) 500 MG capsule, Take 1 capsule (500 mg total) by mouth 4 (four) times daily for 5 days., Disp: 20 capsule, Rfl: 0   albuterol  (PROVENTIL ) (2.5 MG/3ML) 0.083% nebulizer solution, Take 3 mLs (2.5 mg total) by nebulization every 4 (four) hours as needed for wheezing or shortness of breath., Disp: 75 mL, Rfl: 1   amLODipine  (NORVASC ) 2.5 MG tablet, TAKE ONE TABLET BY MOUTH ONCE DAILY, Disp: 15 tablet, Rfl: 0   apixaban  (ELIQUIS ) 2.5 MG TABS tablet, Take 1 tablet (2.5 mg total) by mouth 2 (two) times daily., Disp: 28 tablet, Rfl: 0   arformoterol  (BROVANA ) 15 MCG/2ML NEBU, Take 2 mLs (15 mcg total) by nebulization 2 (two) times daily., Disp: 120 mL, Rfl: 3   budesonide  (PULMICORT ) 0.5 MG/2ML nebulizer solution, Use one vial 3 times daily as needed during flares. (Patient taking differently: Take 0.5 mg by nebulization in the morning and at bedtime.), Disp: 120 mL, Rfl: 1   famotidine  (PEPCID ) 40 MG tablet, Take 40 mg by mouth in the morning., Disp: , Rfl:    fexofenadine (ALLEGRA) 180 MG tablet, Take 180 mg by mouth daily as needed for allergies., Disp: , Rfl:     fluticasone (FLONASE) 50 MCG/ACT nasal spray, Place 2 sprays  into both nostrils daily. (Patient not taking: Reported on 07/24/2024), Disp: , Rfl:    loratadine  (CLARITIN ) 10 MG tablet, Take 10 mg by mouth daily as needed for allergies., Disp: , Rfl:    NON FORMULARY, IB Gaurd, Disp: , Rfl:    omeprazole (PRILOSEC) 40 MG capsule, Take 40 mg by mouth daily before breakfast., Disp: , Rfl:    oxyCODONE  (OXY IR/ROXICODONE ) 5 MG immediate release tablet, Take 1 tablet (5 mg total) by mouth every 4 (four) hours as needed for severe pain (pain score 7-10). For AFTER surgery only, do not take and drive, Disp: 10 tablet, Rfl: 0   Plecanatide  (TRULANCE ) 3 MG TABS, Take 3 mg by mouth in the morning., Disp: , Rfl:    potassium chloride SA (KLOR-CON M) 20 MEQ tablet, Take 20 mEq by mouth daily., Disp: , Rfl:    senna-docusate (SENOKOT-S) 8.6-50 MG tablet, Take 2 tablets by mouth at bedtime. For AFTER surgery, do not take if having diarrhea (Patient not taking: Reported on 07/24/2024), Disp: 30 tablet, Rfl: 0   Simethicone  125 MG CAPS, Take 125-250 mg by mouth as needed (stomach cramps)., Disp: , Rfl:    valACYclovir (VALTREX) 500 MG tablet, Take 500 mg by mouth daily as needed (fever blisters (outbreaks))., Disp: , Rfl:    VENTOLIN  HFA 108 (90 Base) MCG/ACT inhaler, Inhale 1-2 puffs into the lungs every 6 (six) hours as needed for shortness of breath or wheezing., Disp: , Rfl:

## 2024-08-13 NOTE — Progress Notes (Signed)
 Gynecologic Oncology Multi-Disciplinary Disposition Conference Note  Date of the Conference: 08/13/2024  Patient Name: Hannah Short  Referring Provider: Dr. Albertus Primary GYN Oncologist: Dr. Eldonna   Stage/Disposition:  Stage IIIC1 high grade serous endometrial carcinoma. Disposition is to PET imaging and systemic chemotherapy. Also send tumor for NGS testing and tumor origin testing.  This Multidisciplinary conference took place involving physicians from Gynecologic Oncology, Medical Oncology, Radiation Oncology, Pathology, Radiology along with the Gynecologic Oncology Nurse Practitioner and Gynecologic Oncology Nurse Navigator.  Comprehensive assessment of the patient's malignancy, staging, need for surgery, chemotherapy, radiation therapy, and need for further testing were reviewed. Supportive measures, both inpatient and following discharge were also discussed. The recommended plan of care is documented. Greater than 35 minutes were spent correlating and coordinating this patient's care.

## 2024-08-13 NOTE — Telephone Encounter (Signed)
 Hannah Short called to go over her pathology from surgery.  Advised her that Dr. Eldonna will be reviewing it with her at her appointment today.  Briefly discussed the Gyn Oncology Cancer Conference recommendations for sending her tumor for NGS testing/Tumor Origin Testing, systemic chemotherapy and a PET scan once she is healed.  She verbalized understanding and agreement.

## 2024-08-13 NOTE — Patient Instructions (Signed)
 It was a pleasure to see you in clinic today. - Healing well from surgery - Antibiotic sent for 5 days but if you notice worsening redness, let us  know - You can try milk of magnesia for constipation, next dulcolax suppository if milk of mag not enought. And add back the senna with the trulance . - PET will be for 6-8 weeks after surgery - We will send you to Dr. Lonn for chemo.   Thank you very much for allowing me to provide care for you today.  I appreciate your confidence in choosing our Gynecologic Oncology team at [facility].  If you have any questions about your visit today please call our office or send us  a MyChart message and we will get back to you as soon as possible.

## 2024-08-14 ENCOUNTER — Encounter: Payer: Self-pay | Admitting: Oncology

## 2024-08-14 ENCOUNTER — Telehealth: Payer: Self-pay | Admitting: *Deleted

## 2024-08-14 ENCOUNTER — Telehealth: Payer: Self-pay | Admitting: Oncology

## 2024-08-14 DIAGNOSIS — C763 Malignant neoplasm of pelvis: Secondary | ICD-10-CM

## 2024-08-14 LAB — SURGICAL PATHOLOGY

## 2024-08-14 NOTE — Telephone Encounter (Signed)
 Called Hannah Short and discussed treatment at [facility] or [facility].  She prefers [facility].  Scheduled a new patient appointment with Dr. Lonn on 08/21/24 at 2:00 with arrival to the cancer center at 1:30 to check in.  She verbalized understanding and agreement.

## 2024-08-14 NOTE — Progress Notes (Signed)
 Requested MMR and Her2 testing on accession [id number] with [facility] per Dr. Eldonna.

## 2024-08-14 NOTE — Telephone Encounter (Signed)
 Spoke with patient in regards to her scheduled Pet Scan on  Wednesday 2/18 arrival time of 1230 for a scan time of 1 pm at [facility]. Pt is aware no food 6 hours prior, so nothing to eat after 7 am but may have water. Pt verbalized understanding and thanked the office for calling.

## 2024-08-14 NOTE — Progress Notes (Signed)
 Sent order requisition to [facility] on accession [id number] per Dr. Eldonna for xT CDx, RNA expression, xR, HRD and TO.

## 2024-08-21 ENCOUNTER — Encounter: Payer: Self-pay | Admitting: Hematology and Oncology

## 2024-08-21 ENCOUNTER — Inpatient Hospital Stay: Admitting: Hematology and Oncology

## 2024-08-21 VITALS — BP 142/61 | HR 71 | Temp 97.6°F | Resp 16 | Wt 188.8 lb

## 2024-08-21 DIAGNOSIS — C541 Malignant neoplasm of endometrium: Secondary | ICD-10-CM | POA: Diagnosis not present

## 2024-08-21 DIAGNOSIS — Z9079 Acquired absence of other genital organ(s): Secondary | ICD-10-CM | POA: Diagnosis not present

## 2024-08-21 DIAGNOSIS — C55 Malignant neoplasm of uterus, part unspecified: Secondary | ICD-10-CM

## 2024-08-21 DIAGNOSIS — Z90722 Acquired absence of ovaries, bilateral: Secondary | ICD-10-CM

## 2024-08-21 DIAGNOSIS — Z90710 Acquired absence of both cervix and uterus: Secondary | ICD-10-CM | POA: Diagnosis not present

## 2024-08-21 DIAGNOSIS — C775 Secondary and unspecified malignant neoplasm of intrapelvic lymph nodes: Secondary | ICD-10-CM

## 2024-08-21 NOTE — Assessment & Plan Note (Signed)
 The patient initially was diagnosed after presentation to GI with abdominal bloating and weight gain.  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 She was seen by GYN oncologist and on July 31, 2024 underwent surgery Final pathology came back no evidence of disease within the endometrium but with metastatic serous carcinoma bilateral lymph nodes Further molecular studies are pending  She is recovering well from surgery We discussed staging of her disease and the role of adjuvant treatment Assuming HER2/neu testing came back negative, I recommend combination of chemotherapy with immunotherapy We reviewed the NCCN guidelines I recommend treatment based on publication as follows:  Dostarlimab for Primary Advanced or Recurrent Endometrial Cancer  [name], [name], [name], [name], [name], [name], Ren Nichole Esli Clements, [name], [name], [name], [name], [name], [name], [name], [name], [name], [name], [name], [name], [name], [name], [name], [name], [name], [name], et al., for the [employer]*  [name] [name] Med [phone] DOI: [id number]  BACKGROUND Dostarlimab is an immune-checkpoint inhibitor that targets the programmed cell death 1 receptor. The combination of chemotherapy and immunotherapy may have synergistic effects in the treatment of endometrial cancer.  METHODS We conducted a phase 3, global, double-blind, randomized, placebo-controlled trial. Eligible patients with primary advanced stage III or IV or first recurrent endometrial cancer were randomly assigned in a 1:1 ratio to receive either dostarlimab (500 mg) or placebo, plus carboplatin (area under the concentration-time curve, 5 mg per milliliter per minute) and paclitaxel (175 mg per square meter of body-surface area), every 3 weeks (six cycles),  followed by dostarlimab (1000 mg) or placebo every 6 weeks for up to 3 years. The primary end points were progression-free survival as assessed by the investigator according to Response Evaluation Criteria in Solid Tumors (RECIST), version 1.1, and overall survival. Safety was also assessed.  RESULTS Of the 494 patients who underwent randomization, 118 (23.9%) had mismatch repair-deficient (dMMR), microsatellite instability-high (MSI-H) tumors. In the dMMR-MSI-H population, estimated progression-free survival at 24 months was 61.4% (95% confidence interval [CI], 46.3 to 73.4) in the dostarlimab group and 15.7% (95% CI, 7.2 to 27.0) in the placebo group (hazard ratio for progression or death, 0.28; 95% CI, 0.16 to 0.50; P<0.001). In the overall population, progression-free survival at 24 months was 36.1% (95% CI, 29.3 to 42.9) in the dostarlimab group and 18.1% (95% CI, 13.0 to 23.9) in the placebo group (hazard ratio, 0.64; 95% CI, 0.51 to 0.80; P<0.001). Overall survival at 24 months was 71.3% (95% CI, 64.5 to 77.1) with dostarlimab and 56.0% (95% CI, 48.9 to 62.5) with placebo (hazard ratio for death, 0.64; 95% CI, 0.46 to 0.87). The most common adverse events that occurred or worsened during treatment were nausea (53.9% of the patients in the dostarlimab group and 45.9% of those in the placebo group), alopecia (53.5% and 50.0%), and fatigue (51.9% and 54.5%). Severe and serious adverse events were more frequent in the dostarlimab group than in the placebo group.  CONCLUSIONS Dostarlimab plus carboplatin-paclitaxel significantly increased progression-free survival among patients with primary advanced or recurrent endometrial cancer, with a substantial benefit in the dMMR-MSI-H population. (Funded by [name]; [name] number, [id number])  The risks, benefits, side effects of treatment is discussed with the patient and she agreed to proceed with plan of care.  However, due to long distance of  travel, ultimately, the patient elected to be treated closer  to home I recommend port placement and she agreed and I placed order We will send referral for her to be treated closer to home at [facility]

## 2024-08-21 NOTE — Progress Notes (Signed)
 [facility] CONSULT NOTE  Patient Care Team: Dow Longs, PA-C as PCP - General (Family Medicine) Court Dorn PARAS, MD as PCP - Cardiology (Cardiology)  ASSESSMENT & PLAN:  Uterine cancer Hannah Short) The patient initially was diagnosed after presentation to GI with abdominal bloating and weight gain.  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 She was seen by GYN oncologist and on July 31, 2024 underwent surgery Final pathology came back no evidence of disease within the endometrium but with metastatic serous carcinoma bilateral lymph nodes Further molecular studies are pending  She is recovering well from surgery We discussed staging of her disease and the role of adjuvant treatment Assuming HER2/neu testing came back negative, I recommend combination of chemotherapy with immunotherapy We reviewed the NCCN guidelines I recommend treatment based on publication as follows:  Dostarlimab for Primary Advanced or Recurrent Endometrial Cancer  Hannah Short, [name], Hannah Short, [name], Hannah Short, [name], Hannah Short, [name], Hannah Short, [name], Hannah Short, [name], Hannah Short, [name], Hannah Short, [name], Hannah Short, [name], Hannah Short, [name], [name], Hannah Short, [name], Hannah Short, [name], Hannah al., for the [employer]*  Hannah Short Med [phone] DOI: [id number]  BACKGROUND Dostarlimab is an immune-checkpoint inhibitor that targets the programmed cell death 1 receptor. The combination of chemotherapy and immunotherapy may have synergistic effects in the treatment of endometrial cancer.  METHODS We conducted a phase 3, global, double-blind, randomized, placebo-controlled trial. Eligible patients with primary advanced stage III or IV or first recurrent endometrial cancer were randomly assigned in a 1:1 ratio to receive either  dostarlimab (500 mg) or placebo, plus carboplatin (area under the concentration-time curve, 5 mg per milliliter per minute) and paclitaxel (175 mg per square meter of body-surface area), every 3 weeks (six cycles), followed by dostarlimab (1000 mg) or placebo every 6 weeks for up to 3 years. The primary end points were progression-free survival as assessed by the investigator according to Response Evaluation Criteria in Solid Tumors (RECIST), version 1.1, and overall survival. Safety was also assessed.  RESULTS Of the 494 patients who underwent randomization, 118 (23.9%) had mismatch repair-deficient (dMMR), microsatellite instability-high (MSI-H) tumors. In the dMMR-MSI-H population, estimated progression-free survival at 24 months was 61.4% (95% confidence interval [CI], 46.3 to 73.4) in the dostarlimab group and 15.7% (95% CI, 7.2 to 27.0) in the placebo group (hazard ratio for progression or death, 0.28; 95% CI, 0.16 to 0.50; P<0.001). In the overall population, progression-free survival at 24 months was 36.1% (95% CI, 29.3 to 42.9) in the dostarlimab group and 18.1% (95% CI, 13.0 to 23.9) in the placebo group (hazard ratio, 0.64; 95% CI, 0.51 to 0.80; P<0.001). Overall survival at 24 months was 71.3% (95% CI, 64.5 to 77.1) with dostarlimab and 56.0% (95% CI, 48.9 to 62.5) with placebo (hazard ratio for death, 0.64; 95% CI, 0.46 to 0.87). The most common adverse events that occurred or worsened during treatment were nausea (53.9% of the patients in the dostarlimab group and 45.9% of those in the placebo group), alopecia (53.5% and 50.0%), and fatigue (51.9% and 54.5%). Severe and serious adverse events were more frequent in the dostarlimab group than in the placebo group.  CONCLUSIONS Dostarlimab plus carboplatin-paclitaxel significantly increased progression-free survival among patients with primary advanced or recurrent endometrial cancer, with a substantial benefit in the dMMR-MSI-H population. (Funded  by Hannah [employer] Short; Hannah Short)  The  risks, benefits, side effects of treatment is discussed with the patient and she agreed to proceed with plan of care.  However, due to long distance of travel, ultimately, the patient elected to be treated closer to home I recommend port placement and she agreed and I placed order We will send referral for her to be treated closer to home at [facility]  Orders Placed This Encounter  Procedures   IR IMAGING GUIDED PORT INSERTION    Standing Status:   Future    Expected Date:   08/28/2024    Expiration Date:   08/21/2025    Reason for Exam (SYMPTOM  OR DIAGNOSIS REQUIRED):   need port for chemo to start in Feb    Preferred Imaging Location?:   [facility]    The total time spent in the appointment was 80 minutes encounter with patients including review of chart and various tests results, discussions about plan of care and coordination of care plan   All questions were answered. The patient knows to call the clinic with any problems, questions or concerns. No barriers to learning was detected.  Hannah Bedford, MD 1/20/20263:19 PM  CHIEF COMPLAINTS/PURPOSE OF CONSULTATION:  Uterine cancer, for adjuvant treatment  HISTORY OF PRESENTING ILLNESS:  Hannah Short 69 y.o. female is here because of recent diagnosis of uterine cancer She is here accompanied by her husband, [facility] The patient works as a LAWYER I have reviewed her chart and materials related to her cancer extensively and collaborated history with the patient. Summary of oncologic history is as follows: Oncology History  Uterine cancer (HCC)  05/16/2024 Initial Diagnosis   Patient presented to GI on 05/16/2024 for severe bloating and weight gain. She also noted that about 2 weeks prior her bowel habits have changed and went from mushy bowel 3-4 times a day to straining for stools which are soft and formed. Also noted to a lot of gas. She had previously undergone  colonoscopy in June 2023 and subsequently in May 2024 with the most recent colonoscopy revealing diverticulosis in the sigmoid and hemorrhoids. Given her symptoms she underwent a CT abdomen/pelvis on 05/24/2024    05/24/2024 Imaging   CT a/p: IMPRESSION: 1. New pelvic lymphadenopathy in the right external iliac chain. No abdominal lymphadenopathy identified. Differential diagnosis includes lymphoproliferative disorder metastatic disease, and infectious/inflammatory etiologies. Consider PET CT, 68-month follow up by CT, or tissue sampling. 2. Cholelithiasis without evidence of cholecystitis.   06/12/2024 Initial Biopsy   A. 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      See comment.  COMMENT:  The specimen shows lymphoid tissue involved by a high-grade malignancy forming papillary architecture. Immunohistochemical stains were performed to characterize the tumor cells. The cells are positive for CK7, PAX8, and WT-1. The cells are negative for TTF-1, CDX2, and GATA3. P53 is a mutant type of staining / strongly and diffusely positive. 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    06/21/2024 Imaging   CT chest high resolution: IMPRESSION: Mosaic ground-glass attenuation suggestive of small airway disease/air trapping which can be seen the setting of hypersensitivity pneumonitis.   Multiple scattered pulmonary nodules as detailed above, grossly stable to prior likely benign. Recommend continued attention on follow-up exam to ensure stability.   Upper abdominal findings as above.   Atherosclerotic calcifications of aorta and coronary arteries.  07/17/2024 Initial Diagnosis   Gynecologic malignancy (HCC)   07/20/2024 Genetic Testing   [employer] germline testing: VUS: MET   07/31/2024  Surgery   Operation: Robotic-assisted laparoscopic total hysterectomy with bilateral salpingo-oophorectomy, pelvic lymphadenectomy bilaterally, lysis of adhesions for approximately 25 minutes, cystoscopy   Operative Findings: On EUA, small mobile uterus, no adnexal masses appreciated.  On intra-abdominal entry, adhesions of the omentum to the anterior abdominal wall along the midline from the level of the umbilicus to the xiphoid process consistent with prior hernia repairs.  Although entry with a 5 mm trocar was within the area of omental adhesions, no obvious injury noted either on initial inspection or subsequently after robotic lysis of adhesions was performed to take down the omentum adherent along the left upper abdomen and around the left upper quadrant incision/trocar.  Normal-appearing small and large bowel.  No peritoneal lesions.  Uterus approximately 8 cm and normal in appearance.  Somewhat atrophic bilateral ovaries, minimally atypical versus atrophic fallopian tubes bilaterally.  No ascites.  On inspection of bilateral pelvic sidewalls, enlarged right external iliac lymph nodes as well as a right obturator lymph node.  No significantly enlarged left pelvic lymph nodes.    07/31/2024 Pathology Results   Cytology: FINAL MICROSCOPIC DIAGNOSIS:  - No malignant cells identified    07/31/2024 Pathology Results   A. UTERUS, CERVIX, FALLOPIAN TUBE, OVARY, BILATERAL, HYSTERECTOMY: Endometrium: Fibrotic with dystrophic calcifications consistent with previous procedure. Negative for carcinoma. Cervix: Nabothian cyst, negative for carcinoma. Myometrium: Unremarkable, negative for carcinoma. Bilateral ovaries: Unremarkable, negative for carcinoma. Bilateral fallopian tubes: Unremarkable, negative for carcinoma. See oncology table and comment.  B. LYMPH NODE, RIGHT EXTERNAL ILIAC, LYMPHADENECTOMY: - Metastatic serous carcinoma involving a lymph node (1/1).  C. LYMPH NODE, RIGHT OBTURATOR,  LYMPHADENECTOMY: - Metastatic serous carcinoma involving a lymph node (1/1).  D. LYMPH NODE, LEFT PELVIC, LYMPHADENECTOMY: - Microscopic focus of metastatic serous carcinoma involving a lymph node (1/1).  ONCOLOGY TABLE: The carcinoma is staged as an endometrial primary although there is no residual carcinoma in the endometrium.  See comment. UTERUS, CARCINOMA OR CARCINOSARCOMA: Resection Procedure: Total hysterectomy with bilateral salpingo-oophorectomy and three sentinel lymph nodes Histologic Type: High-grade serous carcinoma metastatic to lymph nodes Histologic Grade: FIGO grade 3 Myometrial Invasion:      Depth of Myometrial Invasion (mm): 0      Myometrial Thickness (mm): 10      Percentage of Myometrial Invasion: 0 mm Uterine Serosa Involvement: Not identified Cervical stromal Involvement: Not identified Extent of involvement of other tissue/organs: Not identified Peritoneal/Ascitic Fluid: Benign, [id number] Lymphovascular Invasion: Not identified Regional Lymph Nodes:      Pelvic Lymph Nodes Examined:          3 Sentinel          0 non-sentinel          3 total      Pelvic Lymph Nodes with Metastasis: 3          Macrometastasis: (>2.0 mm): 0          Micrometastasis: (>0.2 mm and < 2.0 mm): 0          Isolated Tumor Cells (<0.2 mm): 0          Laterality of Lymph Node with Tumor: Right and left          Extracapsular Extension: Not identified Distant Metastasis:      Distant Site(s) Involved: Not applicable Pathologic Stage Classification (pTNM, AJCC 8th Edition): pT0, pN1a, see comment Ancillary Studies:  Immunohistochemical stains, see comment Representative Tumor Block: C1 Comment(s): The right external iliac, right obturator and left pelvic lymph nodes all show metastatic carcinoma.  Immunohistochemistry shows the carcinoma is positive with WT1, PAX8 and p16 and negative with GATA3.  P53 is strongly positive consistent with mutated pattern.  The morphology and  immunophenotype are consistent with high-grade serous carcinoma.  The carcinoma is staged as an endometrial primary although the endometrium is fibrotic without carcinoma with suggest a previous procedure.  Dr. Viktoria was notified on 08/08/2023.    08/21/2024 Cancer Staging   Staging form: Corpus Uteri - Carcinoma and Carcinosarcoma, AJCC 8th Edition and FIGO 2023 - Pathologic stage from 08/21/2024: FIGO Stage IIIC (pT0, pN1, cM0) - Signed by Lonn Hicks, MD on 08/21/2024 Stage prefix: Initial diagnosis     MEDICAL HISTORY:  Past Medical History:  Diagnosis Date   Acute torn meniscus of knee, left, initial encounter 2019   ADHD (attention deficit hyperactivity disorder)    Anemia    Anxiety    Arthritis    Asthma    Back pain    Bladder infection 07/27/2024   Bulging lumbar disc    Chest pain    Chronically dry eyes, bilateral    Constipation    Depression    Diverticulosis    Fatty liver    Fibromyalgia    Gallstones    GERD (gastroesophageal reflux disease)    Hashimoto's disease    Heart murmur    mitral valve prolapse   Hepatic steatosis    Hiatal hernia    Hypertension    Hypokalemia    Internal hemorrhoids    Joint pain    Lactose intolerance    Lipoma of colon    ??   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 Albertus   CYSTOSCOPY N/A 07/31/2024   Procedure: CYSTOSCOPY;  Surgeon: Viktoria Comer SAUNDERS, MD;  Location: [facility];  Service: Gynecology;  Laterality: N/A;   HERNIA REPAIR  07/2001   LSC ventral hernia repair with mesh (two prior ventral hernia repairs prior to that)   HYSTEROSCOPY W/ ENDOMETRIAL ABLATION  2005   ROBOTIC ASSISTED TOTAL HYSTERECTOMY WITH BILATERAL SALPINGO OOPHERECTOMY  Bilateral 07/31/2024   Procedure: HYSTERECTOMY, TOTAL, ROBOT-ASSISTED, LAPAROSCOPIC, WITH BILATERAL SALPINGO-OOPHORECTOMY;  Surgeon: Viktoria Comer SAUNDERS, MD;  Location: [facility];  Service: Gynecology;  Laterality: Bilateral;   TOE SURGERY  1990   stepped on toothpick    SOCIAL HISTORY: Social History   Socioeconomic History   Marital status: Married    Spouse name: [name]   Number of children: 5   Years of education: 12   Highest education level: Not on file  Occupational History   Occupation: CNA  Tobacco Use   Smoking status: Never    Passive exposure: Past   Smokeless tobacco: Never  Vaping Use   Vaping status: Never Used  Substance and Sexual Activity   Alcohol use: No   Drug use: No   Sexual activity: Not Currently    Partners: Male  Other Topics Concern   Not on file  Social History Narrative  Patient lives at home with her husband Garry) and father in law and Kadie her daughter.   Patient is taking care of her father full time and disabled daughter.   Education some college.    Right handed.   Caffeine one cup of coffee daily.   Pt states she is increasing her water intake.    Drinks decaf tea.       Social Drivers of Health   Tobacco Use: Low Risk (08/21/2024)   Patient History    Smoking Tobacco Use: Never    Smokeless Tobacco Use: Never    Passive Exposure: Past  Financial Resource Strain: Not on file  Food Insecurity: No Food Insecurity (07/31/2024)   Epic    Worried About Programme Researcher, Broadcasting/film/video in the Last Year: Never true    Ran Out of Food in the Last Year: Never true  Transportation Needs: No Transportation Needs (07/31/2024)   Epic    Lack of Transportation (Medical): No    Lack of Transportation (Non-Medical): No  Physical Activity: Not on file  Stress: Not on file  Social Connections: Patient Declined (07/31/2024)   Social Connection and Isolation Panel    Frequency of Communication with Friends and Family: Patient declined    Frequency of  Social Gatherings with Friends and Family: Patient declined    Attends Religious Services: Patient declined    Active Member of Clubs or Organizations: Patient declined    Attends Banker Meetings: Patient declined    Marital Status: Patient declined  Intimate Partner Violence: Patient Declined (07/31/2024)   Epic    Fear of Current or Ex-Partner: Patient declined    Emotionally Abused: Patient declined    Physically Abused: Patient declined    Sexually Abused: Patient declined  Depression (PHQ2-9): Not on file  Alcohol Screen: Not on file  Housing: Unknown (07/31/2024)   Epic    Unable to Pay for Housing in the Last Year: No    Number of Times Moved in the Last Year: Not on file    Homeless in the Last Year: No  Utilities: Patient Declined (07/31/2024)   Epic    Threatened with loss of utilities: Patient declined  Health Literacy: Not on file    FAMILY HISTORY: Family History  Problem Relation Age of Onset   Heart failure Mother    Heart failure Father    Colon cancer Father 13   Hypertension Father    Leukemia Paternal Uncle 64   Liver cancer Half-Brother        EtOH   Cancer Half-Brother        maternal, died from cancer (unknown type)   Lung cancer Half-Brother        smoking hx   Breast cancer Half-Sister        paternal, did not die from breast cancer   Ovarian cancer Half-Sister    Stomach cancer Neg Hx    Esophageal cancer Neg Hx    Endometrial cancer Neg Hx    Prostate cancer Neg Hx     ALLERGIES:  is allergic to flagyl [metronidazole], topamax [topiramate], betanidine [bethanidine], clonazepam, tramadol hcl, betadine [povidone iodine], and sulfa antibiotics.  MEDICATIONS:  Current Outpatient Medications  Medication Sig Dispense Refill   amLODipine  (NORVASC ) 2.5 MG tablet TAKE ONE TABLET BY MOUTH ONCE DAILY 15 tablet 0   arformoterol  (BROVANA ) 15 MCG/2ML NEBU Take 2 mLs (15 mcg total) by nebulization 2 (two) times daily. 120 mL 3    famotidine  (PEPCID ) 40  MG tablet Take 40 mg by mouth in the morning.     loratadine  (CLARITIN ) 10 MG tablet Take 10 mg by mouth daily as needed for allergies.     NON FORMULARY IB Gaurd     omeprazole (PRILOSEC) 40 MG capsule Take 40 mg by mouth daily before breakfast.     oxyCODONE  (OXY IR/ROXICODONE ) 5 MG immediate release tablet Take 1 tablet (5 mg total) by mouth every 4 (four) hours as needed for severe pain (pain score 7-10). For AFTER surgery only, do not take and drive 10 tablet 0   potassium chloride SA (KLOR-CON M) 20 MEQ tablet Take 20 mEq by mouth daily.     senna-docusate (SENOKOT-S) 8.6-50 MG tablet Take 2 tablets by mouth at bedtime. For AFTER surgery, do not take if having diarrhea (Patient not taking: Reported on 07/24/2024) 30 tablet 0   valACYclovir (VALTREX) 500 MG tablet Take 500 mg by mouth daily as needed (fever blisters (outbreaks)).     No current facility-administered medications for this visit.    REVIEW OF SYSTEMS:   Constitutional: Denies fevers, chills or abnormal night sweats Eyes: Denies blurriness of vision, double vision or watery eyes Ears, nose, mouth, throat, and face: Denies mucositis or sore throat Respiratory: Denies cough, dyspnea or wheezes Cardiovascular: Denies palpitation, chest discomfort or lower extremity swelling Gastrointestinal:  Denies nausea, heartburn or change in bowel habits Skin: Denies abnormal skin rashes Lymphatics: Denies new lymphadenopathy or easy bruising Neurological:Denies numbness, tingling or new weaknesses Behavioral/Psych: Mood is stable, no new changes  All other systems were reviewed with the patient and are negative.  PHYSICAL EXAMINATION: ECOG PERFORMANCE STATUS: 1 - Symptomatic but completely ambulatory  Vitals:   08/21/24 1347  BP: (!) 142/61  Pulse: 71  Resp: 16  Temp: 97.6 F (36.4 C)  SpO2: 99%   Filed Weights   08/21/24 1347  Weight: 188 lb 12.8 oz (85.6 kg)    GENERAL:alert, no distress and  comfortable SKIN: skin color, texture, turgor are normal, no rashes or significant lesions EYES: normal, conjunctiva are pink and non-injected, sclera clear OROPHARYNX:no exudate, no erythema and lips, buccal mucosa, and tongue normal  NECK: supple, thyroid  normal size, non-tender, without nodularity LYMPH:  no palpable lymphadenopathy in the cervical, axillary or inguinal LUNGS: clear to auscultation and percussion with normal breathing effort HEART: regular rate & rhythm and no murmurs and no lower extremity edema ABDOMEN:abdomen soft, non-tender and normal bowel sounds.  Noted well-healed surgical scar Musculoskeletal:no cyanosis of digits and no clubbing  PSYCH: alert & oriented x 3 with fluent speech NEURO: no focal motor/sensory deficits  LABORATORY DATA:  I have reviewed the data as listed Lab Results  Component Value Date   WBC 5.7 08/03/2024   HGB 13.2 08/03/2024   HCT 39.4 08/03/2024   MCV 92.5 08/03/2024   PLT 273 08/03/2024   Recent Labs    04/25/24 1609 04/25/24 1609 07/10/24 1433 08/01/24 0525 08/01/24 1218 08/03/24 1410  NA 143  --  140 138  --  140  K 3.2*  --  3.0* 4.3  --  3.1*  CL 101  --  103 101  --  101  CO2 25  --  24 27  --  24  GLUCOSE 86  --  113* 116*  --  86  BUN 20  --  14 9  --  9  CREATININE 1.09*  --  0.77 1.01* 1.16* 0.72  CALCIUM 9.6  --  9.8 8.7*  --  8.8*  GFRNONAA  --    < > >60 >60 51* >60  PROT 7.8  --  8.1  --   --   --   ALBUMIN 4.7  --  4.4  --   --   --   AST 16  --  25  --   --   --   ALT 15  --  23  --   --   --   ALKPHOS 98  --  89  --   --   --   BILITOT 0.4  --  0.5  --   --   --    < > = values in this interval not displayed.    RADIOGRAPHIC STUDIES: I have personally reviewed the radiological images as listed and agreed with the findings in the report. CT ABDOMEN PELVIS W CONTRAST Result Date: 07/27/2024 EXAM: CT ABDOMEN AND PELVIS WITH CONTRAST 07/27/2024 05:17:46 PM TECHNIQUE: CT of the abdomen and pelvis was  performed with the administration of 100 mL of iohexol  (OMNIPAQUE ) 300 MG/ML solution. Multiplanar reformatted images are provided for review. Automated exposure control, iterative reconstruction, and/or weight-based adjustment of the mA/kV was utilized to reduce the radiation dose to as low as reasonably achievable. COMPARISON: None available. CLINICAL HISTORY: Metastatic disease evaluation; Restaging, high grade serous carcinoma on lymph node biopsy from 11/11, surgical planning given delay related to patient having COVID. FINDINGS: LOWER CHEST: Tiny hiatal hernia. LIVER: Similar appearing fluid-dense lesions within the hepatic lobe suggestive of hepatic cysts versus hemangiomas. GALLBLADDER AND BILE DUCTS: Calcified stones within the gallbladder lumen. No associated CT evidence of gallbladder wall thickening or pericholecystic fluid. No biliary ductal dilatation. SPLEEN: No acute abnormality. PANCREAS: Diffusely atrophic pancreas. No focal lesion. Otherwise normal pancreatic contour. No surrounding inflammatory changes. No main pancreatic ductal dilatation. ADRENAL GLANDS: No adrenal gland nodule. KIDNEYS, URETERS AND BLADDER: Bilateral urothelial thickening. Hypodense lesions of the left kidney likely represent simple renal cysts. Subcentimeter hypodensities are too small to characterize - no further follow-up is indicated. Simple renal cysts do not require additional follow-up unless clinically indicated due to signs/symptoms. No hydroureteronephrosis or nephroureterolithiasis is present bilaterally. The urinary bladder is decompressed with a suggestion of mild urinary bladder wall thickening and mucosal hyperemia. No perinephric or periureteral stranding. GI AND BOWEL: Stomach demonstrates no acute abnormality. No small or large bowel thickening or dilatation. No contrast reaches the colon. The appendix is not definitely identified with no inflammatory changes in the right lower quadrant to suggest acute  appendicitis. There is no bowel obstruction. PERITONEUM AND RETROPERITONEUM: No ascites. No free air. VASCULATURE: Aorta is normal in caliber with moderate atherosclerotic plaque. LYMPH NODES: Stable right pelvic sidewall 1.4 cm enlarged lymph node (2.68). Stable to slightly increased in size right cervical lymph node measuring 1.3 cm (from 1.1 cm). REPRODUCTIVE ORGANS: The uterus is unremarkable. No adnexal mass is identified. BONES AND SOFT TISSUES: Intraabdominal wall hernia repair with mesh. No recurrent hernia. Upper abdominal surgical clips noted. No acute osseous abnormality. No focal soft tissue abnormality. IMPRESSION: 1. Bilateral urothelial thickening and suggestion of urinary bladder wall thickening and mucosal hyperemia suggestive of infection. Correlate with urinalysis. PRA to call report. 2. Stable right pelvic sidewall 1.4 cm enlarged lymph node. Stable to slightly increased in size right cervical lymph node measuring 1.3 cm (from 1.1 cm). 3. Cholelithiasis with no CT evidence of acute cholecystitis. 4. Other, non-acute and/or normal findings as above. Electronically signed by: Morgane Naveau MD 07/27/2024 06:58 PM EST  RP Workstation: [id number]

## 2024-08-22 ENCOUNTER — Encounter: Payer: Self-pay | Admitting: Oncology

## 2024-08-22 ENCOUNTER — Telehealth: Payer: Self-pay | Admitting: Oncology

## 2024-08-22 DIAGNOSIS — C549 Malignant neoplasm of corpus uteri, unspecified: Secondary | ICD-10-CM

## 2024-08-22 NOTE — Telephone Encounter (Signed)
 Called Hannah Short and asked if she would like to come to the cancer center to a have blood drawn for [employer] testing or arrange for a mobile draw at her house.  She would rather have the mobile draw arranged.  Advised her that [employer] will be mailing a kit to her house and that [facility] will be calling to schedule an appointment for the mobile draw.  She verbalized understanding and agreement.

## 2024-08-22 NOTE — Progress Notes (Signed)
 Referral placed for Oncology at [facility] per Dr. Lonn.

## 2024-08-23 ENCOUNTER — Telehealth: Payer: Self-pay | Admitting: Oncology

## 2024-08-23 NOTE — Telephone Encounter (Signed)
 Hannah Short called and said she had the biopsy report from her skin punch biopsy that Dr. Lonn is looking for.  She is going to have her husband drop it off at the cancer center.

## 2024-08-24 ENCOUNTER — Encounter: Payer: Self-pay | Admitting: Physician Assistant

## 2024-08-24 ENCOUNTER — Telehealth: Payer: Self-pay | Admitting: *Deleted

## 2024-08-24 NOTE — Telephone Encounter (Signed)
 Spoke with patient who called to state, I have a uti and I need an antibiotic  Patient is experiencing frequency on urination, burning and lower back pain. Pt denies fever.   Advised patient we need a urine sample and if she could come to the cancer center for a lab appointment? Patient states she can't get to [facility] today, but will be able to go to her PCP's office in [place]. Advised patient to call her PCP's office. Pt verbalized understanding and states she will call.

## 2024-08-28 ENCOUNTER — Telehealth: Payer: Self-pay | Admitting: Oncology

## 2024-08-28 NOTE — Telephone Encounter (Signed)
 Called Hannah Short and let her know know that Dr. Lonn reviewed her right forearm punch biopsy and it is not cancer.  The fibrosis most likely caused by trauma.  Hannah Short verbalized understanding and agreement and didn't have any further questions.

## 2024-08-30 ENCOUNTER — Encounter: Payer: Self-pay | Admitting: Psychiatry

## 2024-08-30 ENCOUNTER — Other Ambulatory Visit: Payer: Self-pay

## 2024-08-30 ENCOUNTER — Inpatient Hospital Stay (HOSPITAL_BASED_OUTPATIENT_CLINIC_OR_DEPARTMENT_OTHER): Admitting: Oncology

## 2024-08-30 VITALS — BP 131/62 | HR 81 | Temp 99.1°F | Resp 18 | Ht 64.0 in | Wt 188.0 lb

## 2024-08-30 DIAGNOSIS — C549 Malignant neoplasm of corpus uteri, unspecified: Secondary | ICD-10-CM

## 2024-08-30 DIAGNOSIS — C541 Malignant neoplasm of endometrium: Secondary | ICD-10-CM | POA: Diagnosis not present

## 2024-08-30 NOTE — Progress Notes (Signed)
 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 Summary of oncologic history is as follows:  Diagnosis: FIGO stage IIIc serous carcinoma  -05/24/2024: CT AP: New pelvic lymphadenopathy in the right external iliac chain. No abdominal lymphadenopathy identified. Differential diagnosis includes lymphoproliferative disorder metastatic disease, and infectious/inflammatory etiologies.  -06/12/2024: Right iliac lymph node biopsy.  Pathology: Metastatic carcinoma, favor high-grade serous carcinoma of gynecologic system. The cells are positive for CK7, PAX8, and WT-1. The cells are negative for TTF-1, CDX2, and GATA3.P53 is a mutant type of staining / strongly and diffusely positive.  -11/202/205: CT chest: Mosaic ground-glass attenuation suggestive of small airway disease/air trapping which can be seen the setting of hypersensitivity pneumonitis. Multiple scattered pulmonary nodules as detailed above, grossly stable to prior likely benign. -06/25/2024: CA 125: 69.0 -07/04/2024: [facility] Genetic Testing: heterozygous for MET - Uncertain Significance. -07/27/2024: CT AP: Bilateral urothelial thickening and suggestion of urinary bladder wall thickening and mucosal hyperemia suggestive of infection. Stable right pelvic sidewall 1.4 cm enlarged lymph node. Stable to slightly increased in size right cervical lymph node measuring 1.3 cm (from 1.1 cm). -07/31/2024: Total hysterectomy with BSO and pelvic lymphadenectomy.  Pathology: Right external iliac lymph node, right obturator lymph node, and left pelvic lymph node positive for metastatic serous carcinoma. Uterus, cervix, fallopian tubes, and ovaries negative for carcinoma. Immunohistochemistry shows the carcinoma is positive with WT1, PAX8 and p16 and  negative with GATA3.  P53 is strongly positive consistent with mutated pattern. Staging: pT0, pN1a  -08/21/2024: Patient seen by Dr. Lonn [oncology] who recommended chemoimmunotherapy with carboplatin, paclitaxel, and dostarlimab  -08/29/2024:: [employer] testing: TMB: low, 1.6mut/Mb, MSI, HRD- could not be assessed.    History of Presenting Illness: Discussed the use of AI scribe software for clinical note transcription with the patient, who gave verbal consent to proceed.  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  She was diagnosed with high grade serous carcinoma following surgical resection of gynecologic tissue on December 30th. A baseline PET scan is scheduled for June 18th.  She reports no pain, fever, or constitutional symptoms. She reports mild pressure and difficulty with transitions since surgery. She expresses concern regarding anticipated alopecia and chemotherapy-induced neuropathy and has arranged for a wig fitting.  She has a persistent, pruritic rash since March of the previous year, which has spread from a localized area to involve her arm and elbow. Multiple topical treatments, including antifungal cream and hydrocortisone, have provided relief from pruritus but not halted the spread. Dermatology evaluation included a punch biopsy, which was negative for malignancy. She has also seen an allergist and is scheduled for follow-up, as well as a rheumatologist, but no definitive diagnosis has been made. She notes intermittent ecchymoses that resolve spontaneously, without other bleeding symptoms.  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 She is currently on short-term disability, set to expire  February 10th. She denies smoking and has no current gastrointestinal symptoms. Genetic testing for hereditary cancer syndromes was negative.    Medical History: Past Medical History:  Diagnosis Date   Acute torn meniscus of  knee, left, initial encounter 2019   ADHD (attention deficit hyperactivity disorder)    Anemia    Anxiety    Arthritis    Asthma    Back pain    Bladder infection 07/27/2024   Bulging lumbar disc    Chest pain    Chronically dry eyes, bilateral    Constipation    Depression    Diverticulosis    Fatty liver    Fibromyalgia    Gallstones    GERD (gastroesophageal reflux disease)    Hashimoto's disease    Heart murmur    mitral valve prolapse   Hepatic steatosis    Hiatal hernia    Hypertension    Hypokalemia    Internal hemorrhoids    Joint pain    Lactose intolerance    Lipoma of colon    ??   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 Albertus   CYSTOSCOPY N/A 07/31/2024   Procedure: CYSTOSCOPY;  Surgeon: Viktoria Comer SAUNDERS, MD;  Location: [facility];  Service: Gynecology;  Laterality: N/A;   HERNIA REPAIR  07/2001   LSC ventral hernia repair with mesh (two prior ventral hernia repairs prior to that)   HYSTEROSCOPY W/ ENDOMETRIAL ABLATION  2005   IR IMAGING GUIDED PORT INSERTION  08/31/2024   ROBOTIC ASSISTED TOTAL HYSTERECTOMY WITH BILATERAL SALPINGO OOPHERECTOMY Bilateral 07/31/2024   Procedure: HYSTERECTOMY, TOTAL, ROBOT-ASSISTED, LAPAROSCOPIC, WITH BILATERAL SALPINGO-OOPHORECTOMY;  Surgeon: Viktoria Comer SAUNDERS, MD;  Location: [facility];  Service: Gynecology;  Laterality: Bilateral;   TOE SURGERY  1990   stepped on toothpick     Allergies:  is allergic to flagyl  [metronidazole], topamax [topiramate], betanidine [bethanidine], clonazepam, tramadol hcl, betadine [povidone iodine], latex, and sulfa antibiotics.  Medications:  Current Outpatient Medications  Medication Sig Dispense Refill   amLODipine  (NORVASC ) 2.5 MG tablet TAKE ONE TABLET BY MOUTH ONCE DAILY 15 tablet 0   arformoterol  (BROVANA ) 15 MCG/2ML NEBU Take 2 mLs (15 mcg total) by nebulization 2 (two) times daily. 120 mL 3   ciprofloxacin  (CIPRO ) 500 MG tablet Take 500 mg by mouth 2 (two) times daily.     famotidine  (PEPCID ) 40 MG tablet Take 40 mg by mouth in the morning.     loratadine  (CLARITIN ) 10 MG tablet Take 10 mg by mouth daily as needed for allergies.     NON FORMULARY IB Gaurd     omeprazole (PRILOSEC) 40 MG capsule Take 40 mg by mouth daily before breakfast.     oxyCODONE  (OXY IR/ROXICODONE ) 5 MG immediate release tablet Take 1 tablet (5 mg total) by mouth every 4 (four) hours as needed for severe pain (pain score 7-10). For AFTER surgery only, do not take and drive 10 tablet 0   potassium chloride SA (KLOR-CON M) 20 MEQ tablet Take 20 mEq by mouth daily.     senna-docusate (SENOKOT-S) 8.6-50 MG tablet Take 2 tablets by mouth at bedtime. For AFTER surgery, do not take if having diarrhea 30 tablet 0   valACYclovir (VALTREX) 500 MG tablet Take 500 mg by mouth daily as needed (fever blisters (outbreaks)).  No current facility-administered medications for this visit.     Physical Examination: ECOG PERFORMANCE STATUS: 0 - Asymptomatic  Vitals:   08/30/24 1103  BP: 131/62  Pulse: 81  Resp: 18  Temp: 99.1 F (37.3 C)  SpO2: 98%   Filed Weights   08/30/24 1103  Weight: 188 lb (85.3 kg)    GENERAL:alert, no distress and comfortable SKIN: skin color, texture, turgor are normal, no rashes or significant lesions LYMPH:  no palpable lymphadenopathy in the cervical, axillary or inguinal LUNGS: clear to auscultation and percussion with normal breathing effort HEART: regular  rate & rhythm and no murmurs and no lower extremity edema ABDOMEN:abdomen soft, non-tender and normal bowel sounds Musculoskeletal:no cyanosis of digits and no clubbing  PSYCH: alert & oriented x 3 with fluent speech  Laboratory Data: I have reviewed the data as listed Lab Results  Component Value Date   WBC 5.7 08/03/2024   HGB 13.2 08/03/2024   HCT 39.4 08/03/2024   MCV 92.5 08/03/2024   PLT 273 08/03/2024      Chemistry      Component Value Date/Time   NA 140 08/03/2024 1410   NA 143 04/25/2024 1609   K 3.1 (L) 08/03/2024 1410   CL 101 08/03/2024 1410   CO2 24 08/03/2024 1410   BUN 9 08/03/2024 1410   BUN 20 04/25/2024 1609   CREATININE 0.72 08/03/2024 1410      Component Value Date/Time   CALCIUM 8.8 (L) 08/03/2024 1410   ALKPHOS 89 07/10/2024 1433   AST 25 07/10/2024 1433   ALT 23 07/10/2024 1433   BILITOT 0.5 07/10/2024 1433   BILITOT 0.4 04/25/2024 1609       Radiographic Studies: I have personally reviewed the radiological images as listed and agreed with the findings in the report.   ASSESSMENT & PLAN:  Patient is a 69 y.o. 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 Primary site not definitively identified. No hereditary cancer syndrome. Adjuvant systemic therapy indicated due to nodal involvement and risk of microscopic disease.  - Initiate adjuvant chemotherapy with carboplatin and paclitaxel, for six cycles every three weeks. - Add immunotherapy with dostarlimab for up to thirty months, starting concurrently with chemotherapy and continuing as maintenance post-chemotherapy. -We reviewed risks Vs benefits in detail, including the most common side  effects. -Carboplatin commonly causes bone marrow suppression (leading to low blood counts), nausea, fatigue, and less often kidney or nerve toxicity. Paclitaxel frequently causes peripheral neuropathy, hair loss, muscle/joint pain, low blood counts, and hypersensitivity reactions during infusion. Dostarlimab, an immune checkpoint inhibitor, can cause immune-related side effects such as thyroid  dysfunction, rash, diarrhea/colitis, hepatitis, and fatigue - Order antiemetics: Compazine  as first-line, Zofran  as second-line. - Schedule port placement for chemotherapy administration (tomorrow). - Will obtain baseline PET scan (scheduled June 18) to assess disease status prior to chemotherapy; coordinate chemotherapy initiation with GYN ONC - Provide chemotherapy education session with nursing staff prior to treatment initiation. - Monitor complete blood count at each chemotherapy visit. 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 - Provided resources for anticipated alopecia; she has arranged wig fitting. -Will refer to radiation oncology for Vaginal brachytherapy  Return to clinic prior to second cycle  to assess for tolerance.    Orders Placed This Encounter  Procedures   Ambulatory referral to Social Work    Referral Priority:   Routine    Referral Type:   Consultation    Referral Reason:   Specialty Services Required    Number of Visits Requested:   1   Ambulatory Referral to [facility] Nutrition    Referral Priority:   Routine    Referral Type:   Consultation    Referral Reason:   Specialty Services Required    Number of Visits Requested:   1    The total time spent in the appointment was 55 minutes encounter with patients including review of chart and various tests results, discussions about plan of care and coordination of care plan   All questions were answered. The patient knows to call the clinic with any problems, questions or  concerns. No barriers to learning was detected.  Mickiel Dry, MD 1/31/202610:37 AM "

## 2024-08-30 NOTE — Patient Instructions (Addendum)
 [facility]  Discharge Instructions  You were seen and examined today by Dr. Davonna. Dr. Davonna is a medical oncologist, meaning that she specializes in the treatment of cancer diagnoses. Dr. Davonna discussed your past medical history, family history of cancers, and the events that led to you being here today.  You were referred to Dr. Davonna for ongoing management of your endometrial cancer. It is recommended that you have chemotherapy and immunotherapy as previously discussed with Dr. Lonn.  Please proceed with your Port-A-Cath placement tomorrow as scheduled.  Prior to the start of treatment, you will meet with the chemotherapy educator to discuss treatment medications and side effects in detail.  Follow-up as scheduled.  Thank you for choosing [facility] to provide your oncology and hematology care.   To afford each patient quality time with our provider, please arrive at least 15 minutes before your scheduled appointment time. You may need to reschedule your appointment if you arrive late (10 or more minutes). Arriving late affects you and other patients whose appointments are after yours.  Also, if you miss three or more appointments without notifying the office, you may be dismissed from the clinic at the providers discretion.    Again, thank you for choosing [facility].  Our hope is that these requests will decrease the amount of time that you wait before being seen by our physicians.   If you have a lab appointment with the Cancer Center - please note that after April 8th, all labs will be drawn in the cancer center.  You do not have to check in or register with the main entrance as you have in the past but will complete your check-in at the cancer center.            _____________________________________________________________  Should you have questions after your visit to [facility], please  contact our office at [phone] and follow the prompts.  Our office hours are 8:00 a.m. to 4:30 p.m. Monday - Thursday and 8:00 a.m. to 2:30 p.m. Friday.  Please note that voicemails left after 4:00 p.m. may not be returned until the following business day.  We are closed weekends and all major holidays.  You do have access to a nurse 24-7, just call the main number to the clinic [phone] and do not press any options, hold on the line and a nurse will answer the phone.    For prescription refill requests, have your pharmacy contact our office and allow 72 hours.    Masks are no longer required in the cancer centers. If you would like for your care team to wear a mask while they are taking care of you, please let them know. You may have one support person who is at least 68 years old accompany you for your appointments.

## 2024-08-30 NOTE — H&P (Signed)
 "    Chief Complaint: uterine cancer  Referring Provider(s): Lonn Hicks, MD  Supervising Physician: Luverne Aran  Patient Status: [name] - Out-pt  History of Present Illness: Hannah Short is a 69 y.o. female with medical history significant for anxiety, depression, anemia, fibromyalgia, GERD, Hashimoto's disease, HTN, mitral valve prolapse, and diverticulosis. Patient initially presented to GI on 05/16/2024 with abdominal bloating and weight gain. CT abdomen pelvis with contrast performed on 05/24/2024 revealed pelvic lymphadenopathy in the right external iliac chain. She had a biopsy done in IR by Dr. 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 She underwent surgery including robotic-assisted laparoscopic total hysterectomy with bilateral salpingo-oophorectomy, pelvic lymphadenectomy bilaterally, lysis of adhesions, and cystoscopy with GYN oncology on 07/31/2024. 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 Received request for image guided port a catheter placement for chemotherapy treatment.   Confirms NPO since MN and ride/supervision available for 24 hours.   Denies fever, chills, SOB, CP, sore throat, N/V, abd pain, blood in stool or urine, abnormal bruising, leg swelling, back pain.    Allergies Reviewed:  Flagyl [metronidazole], Topamax [topiramate], Betanidine [bethanidine], Clonazepam, Tramadol hcl, Betadine [povidone iodine], Latex, and Sulfa antibiotics   Patient is Full Code  Past Medical History:  Diagnosis Date   Acute torn meniscus of knee, left, initial encounter 2019   ADHD (attention deficit hyperactivity disorder)    Anemia    Anxiety    Arthritis    Asthma    Back pain    Bladder infection 07/27/2024   Bulging lumbar disc    Chest pain    Chronically dry eyes, bilateral     Constipation    Depression    Diverticulosis    Fatty liver    Fibromyalgia    Gallstones    GERD (gastroesophageal reflux disease)    Hashimoto's disease    Heart murmur    mitral valve prolapse   Hepatic steatosis    Hiatal hernia    Hypertension    Hypokalemia    Internal hemorrhoids    Joint pain    Lactose intolerance    Lipoma of colon    ??   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 Albertus   CYSTOSCOPY N/A 07/31/2024   Procedure: CYSTOSCOPY;  Surgeon: Viktoria Comer SAUNDERS, MD;  Location: [facility];  Service: Gynecology;  Laterality: N/A;   HERNIA REPAIR  07/2001   LSC ventral hernia repair with mesh (two prior ventral hernia repairs prior to that)   HYSTEROSCOPY W/ ENDOMETRIAL ABLATION  2005   ROBOTIC ASSISTED TOTAL HYSTERECTOMY WITH BILATERAL SALPINGO OOPHERECTOMY Bilateral 07/31/2024   Procedure: HYSTERECTOMY, TOTAL, ROBOT-ASSISTED, LAPAROSCOPIC, WITH BILATERAL SALPINGO-OOPHORECTOMY;  Surgeon: Viktoria Comer SAUNDERS, MD;  Location: [facility];  Service: Gynecology;  Laterality: Bilateral;   TOE SURGERY  1990   stepped on toothpick      Medications: Prior to Admission medications  Medication Sig Start Date End Date Taking? Authorizing Provider  amLODipine  (NORVASC ) 2.5 MG tablet TAKE ONE TABLET BY MOUTH ONCE  DAILY 11/11/23   Court Dorn PARAS, MD  arformoterol  (BROVANA ) 15 MCG/2ML NEBU Take 2 mLs (15 mcg total) by nebulization 2 (two) times daily. 05/23/24 09/20/24  Iva Marty Saltness, MD  ciprofloxacin  (CIPRO ) 500 MG tablet Take 500 mg by mouth 2 (two) times daily. 08/24/24   [provider]  famotidine  (PEPCID ) 40 MG tablet Take 40 mg by mouth in the morning.    [provider]  loratadine   (CLARITIN ) 10 MG tablet Take 10 mg by mouth daily as needed for allergies.    [provider]  NON FORMULARY IB Gaurd    [provider]  omeprazole (PRILOSEC) 40 MG capsule Take 40 mg by mouth daily before breakfast.    [provider]  oxyCODONE  (OXY IR/ROXICODONE ) 5 MG immediate release tablet Take 1 tablet (5 mg total) by mouth every 4 (four) hours as needed for severe pain (pain score 7-10). For AFTER surgery only, do not take and drive 03/05/72   Cross, Melissa D, NP  potassium chloride SA (KLOR-CON M) 20 MEQ tablet Take 20 mEq by mouth daily. 06/09/24   [provider]  senna-docusate (SENOKOT-S) 8.6-50 MG tablet Take 2 tablets by mouth at bedtime. For AFTER surgery, do not take if having diarrhea 06/25/24   Cross, Melissa D, NP  valACYclovir (VALTREX) 500 MG tablet Take 500 mg by mouth daily as needed (fever blisters (outbreaks)). 02/22/24   [provider]     Family History  Problem Relation Age of Onset   Heart failure Mother    Heart failure Father    Colon cancer Father 12   Hypertension Father    Leukemia Paternal Uncle 50   Liver cancer Half-Brother        EtOH   Cancer Half-Brother        maternal, died from cancer (unknown type)   Lung cancer Half-Brother        smoking hx   Breast cancer Half-Sister        paternal, did not die from breast cancer   Ovarian cancer Half-Sister    Stomach cancer Neg Hx    Esophageal cancer Neg Hx    Endometrial cancer Neg Hx    Prostate cancer Neg Hx     Social History   Socioeconomic History   Marital status: Married    Spouse name: [name]   Number of children: 5   Years of education: 12   Highest education level: Not on file  Occupational History   Occupation: CNA  Tobacco Use   Smoking status: Never    Passive exposure: Past   Smokeless tobacco: Never  Vaping Use   Vaping status: Never Used  Substance and Sexual Activity   Alcohol use: No   Drug use: No   Sexual activity:  Not Currently    Partners: Male  Other Topics Concern   Not on file  Social History Narrative   Patient lives at home with her husband Garry) and father in social worker and [place] her daughter.   Patient is taking care of her father full time and disabled daughter.   Education some college.    Right handed.   Caffeine one cup of coffee daily.   Pt states she is increasing her water intake.    Drinks decaf tea.       Social Drivers of Health   Tobacco Use: Low Risk (08/31/2024)   Patient History    Smoking Tobacco Use: Never    Smokeless Tobacco Use: Never  Passive Exposure: Past  Physicist, Medical Strain: Not on file  Food Insecurity: No Food Insecurity (08/30/2024)   Epic    Worried About Programme Researcher, Broadcasting/film/video in the Last Year: Never true    Ran Out of Food in the Last Year: Never true  Transportation Needs: No Transportation Needs (08/30/2024)   Epic    Lack of Transportation (Medical): No    Lack of Transportation (Non-Medical): No  Physical Activity: Not on file  Stress: Not on file  Social Connections: Patient Declined (07/31/2024)   Social Connection and Isolation Panel    Frequency of Communication with Friends and Family: Patient declined    Frequency of Social Gatherings with Friends and Family: Patient declined    Attends Religious Services: Patient declined    Database Administrator or Organizations: Patient declined    Attends Banker Meetings: Patient declined    Marital Status: Patient declined  Depression (PHQ2-9): Low Risk (08/30/2024)   Depression (PHQ2-9)    PHQ-2 Score: 0  Alcohol Screen: Not on file  Housing: Unknown (08/30/2024)   Epic    Unable to Pay for Housing in the Last Year: No    Number of Times Moved in the Last Year: Not on file    Homeless in the Last Year: No  Utilities: Not At Risk (08/30/2024)   Epic    Threatened with loss of utilities: No  Health Literacy: Not on file     Review of Systems: A 12 point ROS discussed and  pertinent positives are indicated in the HPI above.  All other systems are negative.    Vital Signs: BP 134/89   Pulse 75   Temp 99 F (37.2 C) (Oral)   Resp 18   Ht 5' 4 (1.626 m)   Wt 188 lb 0.8 oz (85.3 kg)   SpO2 97%   BMI 32.28 kg/m   Advance Care Plan: no documents on file    Physical Exam Vitals reviewed.  Constitutional:      General: She is not in acute distress.    Appearance: Normal appearance. She is not ill-appearing, toxic-appearing or diaphoretic.  HENT:     Head: Normocephalic and atraumatic.  Cardiovascular:     Rate and Rhythm: Normal rate and regular rhythm.     Heart sounds: No murmur heard.    No friction rub. No gallop.  Pulmonary:     Effort: Pulmonary effort is normal.     Breath sounds: Normal breath sounds. No wheezing, rhonchi or rales.  Abdominal:     General: Abdomen is flat. Bowel sounds are normal. There is no distension.     Tenderness: There is no abdominal tenderness. There is no guarding.  Skin:    General: Skin is warm and dry.  Neurological:     Mental Status: She is alert.     Imaging: No results found.  Labs:  CBC: Recent Labs    06/12/24 0917 07/10/24 1433 08/01/24 0525 08/03/24 1410  WBC 5.6 5.5 17.8* 5.7  HGB 13.5 14.0 12.5 13.2  HCT 40.0 41.6 37.4 39.4  PLT 270 310 318 273    COAGS: Recent Labs    06/12/24 0917  INR 1.0    BMP: Recent Labs    04/25/24 1609 07/10/24 1433 08/01/24 0525 08/01/24 1218 08/03/24 1410  NA 143 140 138  --  140  K 3.2* 3.0* 4.3  --  3.1*  CL 101 103 101  --  101  CO2 25 24  27  --  24  GLUCOSE 86 113* 116*  --  86  BUN 20 14 9   --  9  CALCIUM 9.6 9.8 8.7*  --  8.8*  CREATININE 1.09* 0.77 1.01* 1.16* 0.72  GFRNONAA  --  >60 >60 51* >60    LIVER FUNCTION TESTS: Recent Labs    04/25/24 1609 07/10/24 1433  BILITOT 0.4 0.5  AST 16 25  ALT 15 23  ALKPHOS 98 89  PROT 7.8 8.1  ALBUMIN 4.7 4.4    TUMOR MARKERS: No results for input(s): AFPTM, CEA,  CA199, CHROMGRNA in the last 8760 hours.  Assessment and Plan: Uterine cancer-  Request for image guided port a catheter placement.  No contraindications for procedure identified in ROS, physical exam, or review of pre-sedation considerations.  Labs reviewed and within acceptable range Imaging available and reviewed VSS, afebrile   Risks and benefits of image guided port-a-catheter placement was discussed with the patient including, but not limited to bleeding, infection, pneumothorax, or fibrin sheath development and need for additional procedures.  All of the patient's questions were answered, patient is agreeable to proceed. Consent signed and in chart.    Thank you for allowing our service to participate in MARUA QIN 's care.    Electronically Signed: Leanor JINNY Forget, PA-C   08/31/2024, 2:37 PM     I spent a total of 30 Minutes in face to face in clinical consultation, greater than 50% of which was counseling/coordinating care for uterine cancer.   (A copy of this note was sent to the referring provider and the time of visit.) "

## 2024-08-31 ENCOUNTER — Encounter (HOSPITAL_COMMUNITY): Payer: Self-pay

## 2024-08-31 ENCOUNTER — Ambulatory Visit (HOSPITAL_COMMUNITY)
Admission: RE | Admit: 2024-08-31 | Discharge: 2024-08-31 | Disposition: A | Source: Ambulatory Visit | Attending: Hematology and Oncology

## 2024-08-31 ENCOUNTER — Other Ambulatory Visit: Payer: Self-pay

## 2024-08-31 DIAGNOSIS — Z7722 Contact with and (suspected) exposure to environmental tobacco smoke (acute) (chronic): Secondary | ICD-10-CM | POA: Insufficient documentation

## 2024-08-31 DIAGNOSIS — C55 Malignant neoplasm of uterus, part unspecified: Secondary | ICD-10-CM | POA: Insufficient documentation

## 2024-08-31 MED ORDER — LIDOCAINE HCL 1 % IJ SOLN
INTRAMUSCULAR | Status: AC
  Filled 2024-08-31: qty 20

## 2024-08-31 MED ORDER — HEPARIN SOD (PORK) LOCK FLUSH 100 UNIT/ML IV SOLN
500.0000 [IU] | Freq: Once | INTRAVENOUS | Status: AC
  Administered 2024-08-31: 500 [IU] via INTRAVENOUS

## 2024-08-31 MED ORDER — LIDOCAINE-EPINEPHRINE 1 %-1:100000 IJ SOLN: 20.0000 mL | Freq: Once | INTRAMUSCULAR | Status: DC

## 2024-08-31 MED ORDER — FENTANYL CITRATE (PF) 100 MCG/2ML IJ SOLN
INTRAMUSCULAR | Status: AC | PRN
  Administered 2024-08-31: 50 ug via INTRAVENOUS

## 2024-08-31 MED ORDER — HEPARIN SOD (PORK) LOCK FLUSH 100 UNIT/ML IV SOLN: 500.0000 [IU] | Freq: Once | INTRAVENOUS | Status: DC

## 2024-08-31 MED ORDER — MIDAZOLAM HCL (PF) 2 MG/2ML IJ SOLN
INTRAMUSCULAR | Status: AC | PRN
  Administered 2024-08-31: 1 mg via INTRAVENOUS

## 2024-08-31 MED ORDER — MIDAZOLAM HCL 2 MG/2ML IJ SOLN
INTRAMUSCULAR | Status: AC
  Filled 2024-08-31: qty 2

## 2024-08-31 MED ORDER — HEPARIN SODIUM (PORCINE) 1000 UNIT/ML IJ SOLN
INTRAMUSCULAR | Status: AC
  Filled 2024-08-31: qty 10

## 2024-08-31 MED ORDER — LIDOCAINE-EPINEPHRINE 1 %-1:100000 IJ SOLN
INTRAMUSCULAR | Status: AC
  Filled 2024-08-31: qty 20

## 2024-08-31 MED ORDER — HEPARIN SOD (PORK) LOCK FLUSH 100 UNIT/ML IV SOLN
INTRAVENOUS | Status: AC
  Filled 2024-08-31: qty 5

## 2024-08-31 MED ORDER — DIPHENHYDRAMINE HCL 50 MG/ML IJ SOLN
INTRAMUSCULAR | Status: AC | PRN
  Administered 2024-08-31: 50 mg via INTRAVENOUS

## 2024-08-31 MED ORDER — DIPHENHYDRAMINE HCL 50 MG/ML IJ SOLN
INTRAMUSCULAR | Status: AC
  Filled 2024-08-31: qty 1

## 2024-08-31 MED ORDER — FENTANYL CITRATE (PF) 100 MCG/2ML IJ SOLN
INTRAMUSCULAR | Status: AC
  Filled 2024-08-31: qty 2

## 2024-08-31 MED ORDER — LIDOCAINE HCL 1 % IJ SOLN
20.0000 mL | Freq: Once | INTRAMUSCULAR | Status: AC
  Administered 2024-08-31: 20 mL via INTRADERMAL

## 2024-08-31 MED ORDER — SODIUM CHLORIDE 0.9 % IV SOLN: INTRAVENOUS | Status: DC

## 2024-08-31 NOTE — Progress Notes (Signed)
 Call placed to patient that after discussion with Dr. Eldonna and Dr. Davonna, patient PET scan can be moved to 2/12 and chemotherapy can start the week of 2/16. Patient made aware. Patient to get new schedule on the day of chemo teaching.

## 2024-08-31 NOTE — Sedation Documentation (Signed)
 RN Jazminn Pomales pulled 50 mg Benadryl , 4 mg Versed , and 100 mcg Fentanyl  in IR room. Pt. Received 50 mg Benadryl , 2 mg Versed , and 100 mcg Fentanyl  throughout the procedure.

## 2024-08-31 NOTE — Discharge Instructions (Addendum)
Discharge Instructions:   Please call Interventional Radiology clinic [phone] with any questions or concerns.  You may remove your dressing and shower tomorrow.  Do not use EMLA / Lidocaine cream for 2 weeks post Port Insertion.   Moderate Conscious Sedation, Adult, Care After This sheet gives you information about how to care for yourself after your procedure. Your health care provider may also give you more specific instructions. If you have problems or questions, contact your health care provider. What can I expect after the procedure? After the procedure, it is common to have: Sleepiness for several hours. Impaired judgment for several hours. Difficulty with balance. Vomiting if you eat too soon. Follow these instructions at home: For the time period you were told by your health care provider:  Rest. Do not participate in activities where you could fall or become injured. Do not drive or use machinery. Do not drink alcohol. Do not take sleeping pills or medicines that cause drowsiness. Do not make important decisions or sign legal documents. Do not take care of children on your own. Eating and drinking  Follow the diet recommended by your health care provider. Drink enough fluid to keep your urine pale yellow. If you vomit: Drink water, juice, or soup when you can drink without vomiting. Make sure you have little or no nausea before eating solid foods. General instructions Take over-the-counter and prescription medicines only as told by your health care provider. Have a responsible adult stay with you for the time you are told. It is important to have someone help care for you until you are awake and alert. Do not smoke. Keep all follow-up visits as told by your health care provider. This is important. Contact a health care provider if: You are still sleepy or having trouble with balance after 24 hours. You feel light-headed. You keep feeling nauseous or you keep  vomiting. You develop a rash. You have a fever. You have redness or swelling around the IV site. Get help right away if: You have trouble breathing. You have new-onset confusion at home. Summary After the procedure, it is common to feel sleepy, have impaired judgment, or feel nauseous if you eat too soon. Rest after you get home. Know the things you should not do after the procedure. Follow the diet recommended by your health care provider and drink enough fluid to keep your urine pale yellow. Get help right away if you have trouble breathing or new-onset confusion at home. This information is not intended to replace advice given to you by your health care provider. Make sure you discuss any questions you have with your health care provider. Document Revised: 11/16/2019 Document Reviewed: 06/14/2019 Elsevier Patient Education  2023 Elsevier Inc.    Implanted Port Insertion, Care After The following information offers guidance on how to care for yourself after your procedure. Your health care provider may also give you more specific instructions. If you have problems or questions, contact your health care provider. What can I expect after the procedure? After the procedure, it is common to have: Discomfort at the port insertion site. Bruising on the skin over the port. This should improve over 3-4 days. Follow these instructions at home: Port care After your port is placed, you will get a manufacturer's information card. The card has information about your port. Keep this card with you at all times. Take care of the port as told by your health care provider. Ask your health care provider if you or a family   member can get training for taking care of the port at home. A home health care nurse will be be available to help care for the port. Make sure to remember what type of port you have. Incision care     Follow instructions from your health care provider about how to take care of  your port insertion site. Make sure you: Wash your hands with soap and water for at least 20 seconds before and after you change your bandage (dressing). If soap and water are not available, use hand sanitizer. Change your dressing as told by your health care provider. Leave stitches (sutures), skin glue, or adhesive strips in place. These skin closures may need to stay in place for 2 weeks or longer. If adhesive strip edges start to loosen and curl up, you may trim the loose edges. Do not remove adhesive strips completely unless your health care provider tells you to do that. Check your port insertion site every day for signs of infection. Check for: Redness, swelling, or pain. Fluid or blood. Warmth. Pus or a bad smell. Activity Return to your normal activities as told by your health care provider. Ask your health care provider what activities are safe for you. You may have to avoid lifting. Ask your health care provider how much you can safely lift. General instructions Take over-the-counter and prescription medicines only as told by your health care provider. Do not take baths, swim, or use a hot tub until your health care provider approves. Ask your health care provider if you may take showers. You may only be allowed to take sponge baths. If you were given a sedative during the procedure, it can affect you for several hours. Do not drive or operate machinery until your health care provider says that it is safe. Wear a medical alert bracelet in case of an emergency. This will tell any health care providers that you have a port. Keep all follow-up visits. This is important. Contact a health care provider if: You cannot flush your port with saline as directed, or you cannot draw blood from the port. You have a fever or chills. You have redness, swelling, or pain around your port insertion site. You have fluid or blood coming from your port insertion site. Your port insertion site feels warm  to the touch. You have pus or a bad smell coming from the port insertion site. Get help right away if: You have chest pain or shortness of breath. You have bleeding from your port that you cannot control. These symptoms may be an emergency. Get help right away. Call 911. Do not wait to see if the symptoms will go away. Do not drive yourself to the hospital. Summary Take care of the port as told by your health care provider. Keep the manufacturer's information card with you at all times. Change your dressing as told by your health care provider. Contact a health care provider if you have a fever or chills or if you have redness, swelling, or pain around your port insertion site. Keep all follow-up visits. This information is not intended to replace advice given to you by your health care provider. Make sure you discuss any questions you have with your health care provider. Document Revised: 01/20/2021 Document Reviewed: 01/20/2021 Elsevier Patient Education  2023 Elsevier Inc.    

## 2024-08-31 NOTE — Progress Notes (Signed)
 1650 Ice bag given to use prn to right upper neck and right upper chest for comfort; per instructions.

## 2024-08-31 NOTE — Procedures (Signed)
 Interventional Radiology Procedure Note  Procedure: Single Lumen Power Port Placement    Access:  Right IJ vein.  Findings: Catheter tip positioned at SVC/RA junction. Port is ready for immediate use.   Complications: None  EBL: < 10 mL  Recommendations:  - Ok to shower in 24 hours - Do not submerge for 7 days - Routine line care   Hannah Short T. Fredia Sorrow, M.D Pager:  [phone]

## 2024-09-05 ENCOUNTER — Inpatient Hospital Stay: Attending: Gynecologic Oncology | Admitting: Licensed Clinical Social Worker

## 2024-09-05 ENCOUNTER — Other Ambulatory Visit: Payer: Self-pay | Admitting: Oncology

## 2024-09-05 ENCOUNTER — Encounter: Payer: Self-pay | Admitting: Pulmonary Disease

## 2024-09-05 ENCOUNTER — Encounter (HOSPITAL_BASED_OUTPATIENT_CLINIC_OR_DEPARTMENT_OTHER): Payer: Self-pay | Admitting: Pulmonary Disease

## 2024-09-05 ENCOUNTER — Ambulatory Visit (INDEPENDENT_AMBULATORY_CARE_PROVIDER_SITE_OTHER): Admitting: Pulmonary Disease

## 2024-09-05 VITALS — BP 136/84 | HR 80 | Ht 64.0 in | Wt 187.0 lb

## 2024-09-05 DIAGNOSIS — R0602 Shortness of breath: Secondary | ICD-10-CM

## 2024-09-05 DIAGNOSIS — R918 Other nonspecific abnormal finding of lung field: Secondary | ICD-10-CM | POA: Diagnosis not present

## 2024-09-05 DIAGNOSIS — C549 Malignant neoplasm of corpus uteri, unspecified: Secondary | ICD-10-CM

## 2024-09-05 DIAGNOSIS — J4530 Mild persistent asthma, uncomplicated: Secondary | ICD-10-CM | POA: Diagnosis not present

## 2024-09-05 DIAGNOSIS — J4550 Severe persistent asthma, uncomplicated: Secondary | ICD-10-CM | POA: Diagnosis not present

## 2024-09-05 MED ORDER — ALBUTEROL SULFATE 1.25 MG/3ML IN NEBU: 1.0000 | INHALATION_SOLUTION | Freq: Four times a day (QID) | RESPIRATORY_TRACT | 12 refills | Status: AC | PRN

## 2024-09-05 MED ORDER — BUDESONIDE 0.5 MG/2ML IN SUSP: 0.5000 mg | Freq: Two times a day (BID) | RESPIRATORY_TRACT | 6 refills | Status: AC

## 2024-09-05 MED ORDER — ARFORMOTEROL TARTRATE 15 MCG/2ML IN NEBU: 15.0000 ug | INHALATION_SOLUTION | Freq: Two times a day (BID) | RESPIRATORY_TRACT | 3 refills | Status: AC

## 2024-09-05 NOTE — Assessment & Plan Note (Signed)
 Will check d dimer to rule out PE and CBC to rule out anemia.

## 2024-09-05 NOTE — Patient Instructions (Signed)
" °  VISIT SUMMARY: During your visit, we addressed your severe persistent asthma, pulmonary nodules, and recent diagnosis of endometrial cancer. We discussed your breathing difficulties, persistent rash, and the soreness at your port site.  YOUR PLAN: SEVERE PERSISTENT ASTHMA: Your asthma symptoms are triggered by strong odors and you have a family history of asthma. Your CT scan showed air trapping, and spirometry ruled out COPD. -Take Brovana  twice daily. -Take budesonide  with Brovana  twice daily. -Use albuterol  as needed. -We ordered an eosinophil count. -You are scheduled for a full pulmonary function test. -We referred you to a GI specialist for halitosis evaluation.  PULMONARY NODULES: You have multiple small nodules with ground glass opacities that have been stable since 2021 and are likely benign. No malignancy was found on imaging. -A PET scan is scheduled for February 12th. -Send a message via MyChart after your PET scan for review. -We will consider a biopsy if the PET scan shows any changes.    Contains text generated by [employer].   "

## 2024-09-05 NOTE — Progress Notes (Signed)
 START ON PATHWAY REGIMEN - Uterine     Cycles 1 through 6: A cycle is every 21 days:     Dostarlimab-gxly      Paclitaxel      Carboplatin    Cycles 7 and beyond: A cycle is every 42 days:     Dostarlimab-gxly   **Always confirm dose/schedule in your pharmacy ordering system**  Patient Characteristics: Serous Carcinoma, Newly Diagnosed, Postoperative (Pathologic Staging), Stage III/IV, MSI-H/dMMR Histology: Serous Carcinoma Therapeutic Status: Newly Diagnosed, Postoperative (Pathologic Staging) AJCC T Category: pTX AJCC N Category: pNX AJCC M Category: cM0 AJCC 8 Stage Grouping: IIIA Microsatellite/Mismatch Repair Status: MSI-H/dMMR Intent of Therapy: Curative Intent, Discussed with Patient

## 2024-09-05 NOTE — Progress Notes (Signed)
 [facility] Clinical Social Work  Initial Assessment   Hannah Short is a 69 y.o. year old female contacted by phone. Clinical Social Work was referred by medical provider for assessment of psychosocial needs.   SDOH (Social Determinants of Health) assessments performed: Yes SDOH Interventions    Flowsheet Row Clinical Support from 07/05/2024 in [facility] Of [place]. [facility] Office Visit from 09/01/2020 in [facility] at [facility]  SDOH Interventions    Food Insecurity Interventions Intervention Not Indicated --  Housing Interventions Intervention Not Indicated --  Transportation Interventions Intervention Not Indicated --  Depression Interventions/Treatment  -- Counseling    SDOH Screenings   Food Insecurity: No Food Insecurity (08/30/2024)  Housing: Unknown (08/30/2024)  Transportation Needs: No Transportation Needs (08/30/2024)  Utilities: Not At Risk (08/30/2024)  Depression (PHQ2-9): Low Risk (08/30/2024)  Social Connections: Patient Declined (07/31/2024)  Tobacco Use: Low Risk (08/31/2024)    PHQ 2/9:    08/30/2024   11:08 AM 09/01/2020    9:18 AM 12/17/2019    1:22 PM  Depression screen PHQ 2/9  Decreased Interest 0 2 0  Down, Depressed, Hopeless 0 1 0  PHQ - 2 Score 0 3 0  Altered sleeping  3   Tired, decreased energy  3   Change in appetite  2   Feeling bad or failure about yourself   0   Trouble concentrating  0   Moving slowly or fidgety/restless  0   Suicidal thoughts  0   PHQ-9 Score  11    Difficult doing work/chores  Not difficult at all      Data saved with a previous flowsheet row definition     Distress Screen completed: No     No data to display            Family/Social Information:  Housing Arrangement: patient lives with her husband and daughter.  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  A CNA started yesterday to assist w/ her daughter's care while pt  undergoes treatment.  A second daughter has also moved in to assist pt. Family members/support persons in your life? Pt has 5 children who will all assist as needed as well as supportive friends. 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  Pt's husband is employed as a psychologist, occupational.  Pt's social security has been reduced as she is a paid caregiver, but is anticipated to increase while not working.  Income source: Employment and Actor concerns: No Type of concern: None Food access concerns: no Religious or spiritual practice: Yes-  Advanced directives: Not known Services Currently in place:  none  Coping/ Adjustment to diagnosis: Patient understands treatment plan and what happens next? yes Concerns about diagnosis and/or treatment: How I will care for other members of my family, Overwhelmed by information, Afraid of cancer, and pt is concerned about losing her hair.  She has already cut her hair to her shoulders as it was much longer and has ordered a wig in anticipation of losing her hair. Patient reported stressors: Anxiety/ nervousness and Adjusting to my illness Hopes and/or priorities: pt's priority is to continue treatment w/ the hope of positive results. Patient enjoys time with family/ friends Current coping skills/ strengths: Capable of independent living , Manufacturing systems engineer , Motivation for treatment/growth ,  Physical Health , Religious Affiliation , and Supportive family/friends     SUMMARY: Current SDOH Barriers:  No barriers identified at this time.  Clinical Social Work Clinical Goal(s):  No clinical social work goals at this time  Interventions: Discussed common feeling and emotions when being diagnosed with cancer, and the importance of support during treatment Informed patient of the support team roles and support services at [facility] Provided CSW contact  information and encouraged patient to call with any questions or concerns Referred to Wadie Rung for additional support.    Follow Up Plan: Patient will contact CSW with any support or resource needs Patient verbalizes understanding of plan: Yes    Devere JONELLE Manna, LCSW Clinical Social Worker [facility]

## 2024-09-06 ENCOUNTER — Encounter: Payer: Self-pay | Admitting: Oncology

## 2024-09-06 ENCOUNTER — Other Ambulatory Visit: Payer: Self-pay

## 2024-09-06 ENCOUNTER — Inpatient Hospital Stay

## 2024-09-06 DIAGNOSIS — C549 Malignant neoplasm of corpus uteri, unspecified: Secondary | ICD-10-CM

## 2024-09-06 MED ORDER — PROCHLORPERAZINE MALEATE 10 MG PO TABS: 10.0000 mg | ORAL_TABLET | Freq: Four times a day (QID) | ORAL | 1 refills | Status: AC | PRN

## 2024-09-06 MED ORDER — ONDANSETRON HCL 8 MG PO TABS: 8.0000 mg | ORAL_TABLET | Freq: Three times a day (TID) | ORAL | 1 refills | Status: AC | PRN

## 2024-09-06 MED ORDER — LIDOCAINE-PRILOCAINE 2.5-2.5 % EX CREA: TOPICAL_CREAM | CUTANEOUS | 3 refills | Status: AC

## 2024-09-06 MED ORDER — DEXAMETHASONE 4 MG PO TABS: ORAL_TABLET | ORAL | 1 refills | Status: AC

## 2024-09-06 NOTE — Telephone Encounter (Signed)
 FYI

## 2024-09-06 NOTE — Progress Notes (Signed)

## 2024-09-07 ENCOUNTER — Other Ambulatory Visit: Payer: Self-pay

## 2024-09-07 NOTE — Progress Notes (Signed)
Patient called to update medication list.

## 2024-09-13 ENCOUNTER — Other Ambulatory Visit (HOSPITAL_COMMUNITY)

## 2024-09-17 ENCOUNTER — Inpatient Hospital Stay: Admitting: Dietician

## 2024-09-17 ENCOUNTER — Inpatient Hospital Stay

## 2024-09-19 ENCOUNTER — Inpatient Hospital Stay

## 2024-09-19 ENCOUNTER — Other Ambulatory Visit (HOSPITAL_COMMUNITY)

## 2024-09-21 ENCOUNTER — Ambulatory Visit: Admitting: Family Medicine

## 2024-09-21 ENCOUNTER — Inpatient Hospital Stay

## 2024-09-21 ENCOUNTER — Ambulatory Visit: Admitting: Allergy & Immunology

## 2024-10-10 ENCOUNTER — Inpatient Hospital Stay: Admitting: Oncology

## 2024-10-10 ENCOUNTER — Inpatient Hospital Stay: Attending: Gynecologic Oncology

## 2024-10-10 ENCOUNTER — Inpatient Hospital Stay

## 2024-10-12 ENCOUNTER — Inpatient Hospital Stay

## 2024-10-22 ENCOUNTER — Ambulatory Visit

## 2024-12-03 ENCOUNTER — Encounter (HOSPITAL_BASED_OUTPATIENT_CLINIC_OR_DEPARTMENT_OTHER)

## 2024-12-31 ENCOUNTER — Ambulatory Visit: Admitting: Pulmonary Disease
